# Patient Record
Sex: Female | Born: 1935 | Race: White | Hispanic: No | Smoking: Never smoker
Health system: Southern US, Community
[De-identification: ages and names within clinical notes are randomized; demographics above are authoritative.]

## PROBLEM LIST (undated history)

## (undated) DIAGNOSIS — I10 Essential (primary) hypertension: Secondary | ICD-10-CM

## (undated) DIAGNOSIS — M25569 Pain in unspecified knee: Secondary | ICD-10-CM

## (undated) DIAGNOSIS — M8580 Other specified disorders of bone density and structure, unspecified site: Secondary | ICD-10-CM

## (undated) DIAGNOSIS — J189 Pneumonia, unspecified organism: Secondary | ICD-10-CM

## (undated) DIAGNOSIS — W19XXXA Unspecified fall, initial encounter: Secondary | ICD-10-CM

## (undated) DIAGNOSIS — T148XXA Other injury of unspecified body region, initial encounter: Secondary | ICD-10-CM

## (undated) DIAGNOSIS — I82409 Acute embolism and thrombosis of unspecified deep veins of unspecified lower extremity: Secondary | ICD-10-CM

## (undated) DIAGNOSIS — I509 Heart failure, unspecified: Secondary | ICD-10-CM

## (undated) DIAGNOSIS — D532 Scorbutic anemia: Secondary | ICD-10-CM

## (undated) DIAGNOSIS — S82899B Other fracture of unspecified lower leg, initial encounter for open fracture type I or II: Secondary | ICD-10-CM

## (undated) DIAGNOSIS — R55 Syncope and collapse: Secondary | ICD-10-CM

## (undated) DIAGNOSIS — R200 Anesthesia of skin: Secondary | ICD-10-CM

## (undated) DIAGNOSIS — R21 Rash and other nonspecific skin eruption: Secondary | ICD-10-CM

## (undated) DIAGNOSIS — C801 Malignant (primary) neoplasm, unspecified: Secondary | ICD-10-CM

## (undated) DIAGNOSIS — K219 Gastro-esophageal reflux disease without esophagitis: Secondary | ICD-10-CM

## (undated) DIAGNOSIS — I4891 Unspecified atrial fibrillation: Secondary | ICD-10-CM

## (undated) HISTORY — PX: APPENDECTOMY: SHX54

## (undated) HISTORY — PX: ELBOW SURGERY: SHX618

## (undated) HISTORY — PX: CATARACT EXTRACTION: SUR2

---

## 2000-03-10 ENCOUNTER — Encounter: Payer: Self-pay | Admitting: Endocrinology

## 2000-03-10 ENCOUNTER — Encounter: Admission: RE | Admit: 2000-03-10 | Discharge: 2000-03-10 | Payer: Self-pay | Admitting: Endocrinology

## 2002-09-05 ENCOUNTER — Encounter: Admission: RE | Admit: 2002-09-05 | Discharge: 2002-09-05 | Payer: Self-pay | Admitting: Endocrinology

## 2002-09-05 ENCOUNTER — Encounter: Payer: Self-pay | Admitting: Endocrinology

## 2003-10-05 ENCOUNTER — Encounter: Admission: RE | Admit: 2003-10-05 | Discharge: 2003-10-05 | Payer: Self-pay | Admitting: Endocrinology

## 2004-11-01 ENCOUNTER — Encounter: Admission: RE | Admit: 2004-11-01 | Discharge: 2004-11-01 | Payer: Self-pay | Admitting: Endocrinology

## 2007-02-03 ENCOUNTER — Encounter: Admission: RE | Admit: 2007-02-03 | Discharge: 2007-04-01 | Payer: Self-pay | Admitting: Neurology

## 2009-02-19 ENCOUNTER — Encounter: Admission: RE | Admit: 2009-02-19 | Discharge: 2009-04-24 | Payer: Self-pay | Admitting: Family Medicine

## 2009-04-24 ENCOUNTER — Encounter: Admission: RE | Admit: 2009-04-24 | Discharge: 2009-04-24 | Payer: Self-pay | Admitting: Orthopedic Surgery

## 2011-07-04 ENCOUNTER — Inpatient Hospital Stay (HOSPITAL_COMMUNITY): Payer: Medicare Other

## 2011-07-04 ENCOUNTER — Other Ambulatory Visit: Payer: Self-pay

## 2011-07-04 ENCOUNTER — Inpatient Hospital Stay (HOSPITAL_COMMUNITY)
Admission: EM | Admit: 2011-07-04 | Discharge: 2011-07-10 | DRG: 690 | Disposition: A | Discharge status: Skilled Nursing Facility | Payer: Medicare Other | Attending: Internal Medicine | Admitting: Internal Medicine

## 2011-07-04 ENCOUNTER — Emergency Department (HOSPITAL_COMMUNITY): Payer: Medicare Other

## 2011-07-04 DIAGNOSIS — S322XXA Fracture of coccyx, initial encounter for closed fracture: Secondary | ICD-10-CM | POA: Diagnosis present

## 2011-07-04 DIAGNOSIS — I4891 Unspecified atrial fibrillation: Secondary | ICD-10-CM | POA: Diagnosis present

## 2011-07-04 DIAGNOSIS — R5381 Other malaise: Secondary | ICD-10-CM | POA: Diagnosis present

## 2011-07-04 DIAGNOSIS — M25569 Pain in unspecified knee: Secondary | ICD-10-CM

## 2011-07-04 DIAGNOSIS — W010XXA Fall on same level from slipping, tripping and stumbling without subsequent striking against object, initial encounter: Secondary | ICD-10-CM | POA: Diagnosis present

## 2011-07-04 DIAGNOSIS — Y92009 Unspecified place in unspecified non-institutional (private) residence as the place of occurrence of the external cause: Secondary | ICD-10-CM

## 2011-07-04 DIAGNOSIS — K5900 Constipation, unspecified: Secondary | ICD-10-CM | POA: Diagnosis present

## 2011-07-04 DIAGNOSIS — E871 Hypo-osmolality and hyponatremia: Secondary | ICD-10-CM | POA: Diagnosis present

## 2011-07-04 DIAGNOSIS — M8448XA Pathological fracture, other site, initial encounter for fracture: Secondary | ICD-10-CM

## 2011-07-04 DIAGNOSIS — I10 Essential (primary) hypertension: Secondary | ICD-10-CM | POA: Diagnosis present

## 2011-07-04 DIAGNOSIS — G5790 Unspecified mononeuropathy of unspecified lower limb: Secondary | ICD-10-CM | POA: Diagnosis present

## 2011-07-04 DIAGNOSIS — W19XXXA Unspecified fall, initial encounter: Secondary | ICD-10-CM | POA: Diagnosis present

## 2011-07-04 DIAGNOSIS — N390 Urinary tract infection, site not specified: Principal | ICD-10-CM | POA: Diagnosis present

## 2011-07-04 DIAGNOSIS — M810 Age-related osteoporosis without current pathological fracture: Secondary | ICD-10-CM | POA: Diagnosis present

## 2011-07-04 DIAGNOSIS — D62 Acute posthemorrhagic anemia: Secondary | ICD-10-CM | POA: Diagnosis present

## 2011-07-04 DIAGNOSIS — S3210XA Unspecified fracture of sacrum, initial encounter for closed fracture: Secondary | ICD-10-CM | POA: Diagnosis present

## 2011-07-04 DIAGNOSIS — R21 Rash and other nonspecific skin eruption: Secondary | ICD-10-CM

## 2011-07-04 DIAGNOSIS — R350 Frequency of micturition: Secondary | ICD-10-CM | POA: Clinically undetermined

## 2011-07-04 DIAGNOSIS — D638 Anemia in other chronic diseases classified elsewhere: Secondary | ICD-10-CM | POA: Diagnosis present

## 2011-07-04 DIAGNOSIS — R55 Syncope and collapse: Secondary | ICD-10-CM | POA: Diagnosis present

## 2011-07-04 HISTORY — DX: Essential (primary) hypertension: I10

## 2011-07-04 HISTORY — DX: Anesthesia of skin: R20.0

## 2011-07-04 HISTORY — DX: Unspecified fall, initial encounter: W19.XXXA

## 2011-07-04 HISTORY — DX: Pain in unspecified knee: M25.569

## 2011-07-04 HISTORY — DX: Scorbutic anemia: D53.2

## 2011-07-04 HISTORY — DX: Unspecified atrial fibrillation: I48.91

## 2011-07-04 HISTORY — PX: EXPLORATORY LAPAROTOMY: SUR591

## 2011-07-04 HISTORY — DX: Other injury of unspecified body region, initial encounter: T14.8XXA

## 2011-07-04 HISTORY — DX: Syncope and collapse: R55

## 2011-07-04 HISTORY — DX: Rash and other nonspecific skin eruption: R21

## 2011-07-04 HISTORY — DX: Malignant (primary) neoplasm, unspecified: C80.1

## 2011-07-04 LAB — CBC
HCT: 29.4 % — ABNORMAL LOW (ref 36.0–46.0)
MCH: 28.5 pg (ref 26.0–34.0)
MCH: 28.7 pg (ref 26.0–34.0)
MCHC: 33.7 g/dL (ref 30.0–36.0)
MCV: 84.5 fL (ref 78.0–100.0)
MCV: 85.2 fL (ref 78.0–100.0)
Platelets: 229 10*3/uL (ref 150–400)
Platelets: 200 10*3/uL (ref 150–400)
RBC: 4.07 MIL/uL (ref 3.87–5.11)
RDW: 12.9 % (ref 11.5–15.5)
RDW: 12.9 % (ref 11.5–15.5)

## 2011-07-04 LAB — URINALYSIS, ROUTINE W REFLEX MICROSCOPIC
Bilirubin Urine: NEGATIVE
Bilirubin Urine: NEGATIVE
Glucose, UA: NEGATIVE mg/dL
Ketones, ur: NEGATIVE mg/dL
Ketones, ur: NEGATIVE mg/dL
Nitrite: POSITIVE — AB
Nitrite: POSITIVE — AB
Specific Gravity, Urine: 1.013 (ref 1.005–1.030)
Specific Gravity, Urine: 1.013 (ref 1.005–1.030)
Urobilinogen, UA: 0.2 mg/dL (ref 0.0–1.0)
pH: 7 (ref 5.0–8.0)
pH: 5.5 (ref 5.0–8.0)

## 2011-07-04 LAB — URINE MICROSCOPIC-ADD ON

## 2011-07-04 LAB — COMPREHENSIVE METABOLIC PANEL
ALT: 14 U/L (ref 0–35)
AST: 20 U/L (ref 0–37)
Albumin: 3.8 g/dL (ref 3.5–5.2)
Alkaline Phosphatase: 76 U/L (ref 39–117)
Calcium: 10.7 mg/dL — ABNORMAL HIGH (ref 8.4–10.5)
GFR calc Af Amer: 70 mL/min — ABNORMAL LOW (ref >90)
Potassium: 4.1 mEq/L (ref 3.5–5.1)
Sodium: 128 mEq/L — ABNORMAL LOW (ref 135–145)
Total Protein: 7 g/dL (ref 6.0–8.3)

## 2011-07-04 LAB — CARDIAC PANEL(CRET KIN+CKTOT+MB+TROPI)
CK, MB: 4.4 ng/mL — ABNORMAL HIGH (ref 0.3–4.0)
Relative Index: 2.9 — ABNORMAL HIGH (ref 0.0–2.5)
Troponin I: <0.3 ng/mL (ref <0.30)

## 2011-07-04 LAB — DIFFERENTIAL
Basophils Absolute: 0 10*3/uL (ref 0.0–0.1)
Basophils Relative: 0 % (ref 0–1)
Eosinophils Absolute: 0 10*3/uL (ref 0.0–0.7)
Eosinophils Relative: 0 % (ref 0–5)
Lymphs Abs: 1 10*3/uL (ref 0.7–4.0)
Neutrophils Relative %: 81 % — ABNORMAL HIGH (ref 43–77)

## 2011-07-04 MED ORDER — SODIUM CHLORIDE 0.9 % IV SOLN
INTRAVENOUS | Status: DC
  Administered 2011-07-04: 18:00:00 via INTRAVENOUS

## 2011-07-04 MED ORDER — SODIUM CHLORIDE 0.9 % IV SOLN
Freq: Once | INTRAVENOUS | Status: AC
  Administered 2011-07-04: 10:00:00 via INTRAVENOUS
  Administered 2011-07-04: 1000 mL via INTRAVENOUS

## 2011-07-04 MED ORDER — HEPARIN SODIUM (PORCINE) 5000 UNIT/ML IJ SOLN
5000.0000 [IU] | Freq: Three times a day (TID) | INTRAMUSCULAR | Status: DC
  Administered 2011-07-04 – 2011-07-06 (×6): 5000 [IU] via SUBCUTANEOUS
  Filled 2011-07-04 (×8): qty 1

## 2011-07-04 MED ORDER — OLMESARTAN MEDOXOMIL 40 MG PO TABS
40.0000 mg | ORAL_TABLET | Freq: Every day | ORAL | Status: DC
  Administered 2011-07-05 – 2011-07-10 (×6): 40 mg via ORAL
  Filled 2011-07-04 (×8): qty 1

## 2011-07-04 MED ORDER — SODIUM CHLORIDE 0.9 % IV SOLN: INTRAVENOUS | Status: DC

## 2011-07-04 MED ORDER — SODIUM CHLORIDE 0.9 % IV SOLN: Freq: Once | INTRAVENOUS | Status: DC

## 2011-07-04 MED ORDER — CIPROFLOXACIN IN D5W 400 MG/200ML IV SOLN
400.0000 mg | Freq: Two times a day (BID) | INTRAVENOUS | Status: DC
  Filled 2011-07-04 (×5): qty 200

## 2011-07-04 NOTE — ED Notes (Signed)
Pt was being assisted to stand by niece, and as she stood she became unresponsive..was gently lowered to the floor...the patient also fell yesterday and was on the floor for 1 hour before being found.Shirley KitchenMarland Strickland

## 2011-07-04 NOTE — ED Provider Notes (Signed)
History     CSN: [id number] Arrival date & time: 07/04/2011  9:02 AM   First MD Initiated Contact with Patient 07/04/11 0920      Chief Complaint  Patient presents with  . Loss of Consciousness    (Consider location/radiation/quality/duration/timing/severity/associated sxs/prior treatment) Patient is a 77 y.o. female presenting with syncope. The history is provided by the patient and a relative.  Loss of Consciousness   patient here with syncopal event witnessed by relatives today. History of falls in the past did use a walker normally. Had a fall yesterday where she was helped to the ground by her relative. Patient today was walking became diaphoretic and then had a brief syncopal event. No seizure activity noted, no postictal period. Patient denies chest pain shortness of breath abdominal pain or severe headache prior to the today's event. No prior history of syncope in the past. Patient denies any new injuries now. No recent history of fever or bloody stools. Nothing makes her symptoms better or worse  Past Medical History  Diagnosis Date  . Cancer   . 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 No pertinent past surgical history.  History reviewed. No pertinent family history.  History  Substance Use Topics  . Smoking status: Never Smoker   . Smokeless tobacco: Not on file  . Alcohol Use: 1.2 oz/week    2 Cans of beer per week    OB History    Grav Para Term Preterm Abortions TAB SAB Ect Mult Living                  Review of Systems  Cardiovascular: Positive for syncope.  All other systems reviewed and are negative.    Allergies  Biaxin; Cod; Codeine; Darvocet; Demadex; Diovan hct; Ex-lax; Ibuprofen; Indanediones; Indapamide; Morphine and related; Penicillins; Septra; and Tessalon  Home Medications  No current outpatient prescriptions on file.  BP 182/69  Pulse 91  Temp(Src) 97.9 F (36.6 C) (Oral)  Resp 20  SpO2 100%  Physical Exam    Nursing note and vitals reviewed. Constitutional: She is oriented to person, place, and time. She appears well-developed and well-nourished.  Non-toxic appearance. No distress.  HENT:  Head: Normocephalic and atraumatic.  Eyes: Conjunctivae and EOM are normal. Pupils are equal, round, and reactive to light.  Neck: Normal range of motion. Neck supple. No tracheal deviation present.  Cardiovascular: Normal rate, regular rhythm and normal heart sounds.  Exam reveals no gallop.   No murmur heard. Pulmonary/Chest: Effort normal and breath sounds normal. No stridor. No respiratory distress. She has no wheezes.  Abdominal: Soft. Normal appearance and bowel sounds are normal. She exhibits no distension. There is no tenderness. There is no rebound.  Musculoskeletal: Normal range of motion. She exhibits no edema and no tenderness.       Legs: Neurological: She is alert and oriented to person, place, and time. No cranial nerve deficit or sensory deficit. She exhibits abnormal muscle tone. GCS eye subscore is 4. GCS verbal subscore is 5. GCS motor subscore is 6.       Patient's strength is 2/5 bilateral lower  Skin: Skin is warm and dry.  Psychiatric: She has a normal mood and affect. Her speech is normal and behavior is normal.    ED Course  Procedures (including critical care time)   Labs Reviewed  CBC  DIFFERENTIAL  COMPREHENSIVE METABOLIC PANEL  URINALYSIS, ROUTINE W REFLEX MICROSCOPIC  CARDIAC PANEL(CRET KIN+CKTOT+MB+TROPI)   No  results found.   No diagnosis found.    MDM   Date: 07/04/2011  Rate: 96  Rhythm: normal sinus rhythm  QRS Axis: normal  Intervals: normal  ST/T Wave abnormalities: normal  Conduction Disutrbances:none  Narrative Interpretation:   Old EKG Reviewed: none available   Patient's labs and x-rays noted, patient to be admitted for evaluation of syncope       Toy Baker, MD 07/04/11 1232

## 2011-07-04 NOTE — ED Notes (Signed)
Family at bedside. 

## 2011-07-04 NOTE — H&P (Signed)
Shirley Strickland is an 77 y.o. female.   Chief Complaint: syncope one day HPI:  77 year old female, with multiple allergies to medications, came from home ,she used her walker to walk and she managed her self well without many falls. Presented this time after she experienced a fall yesterday ,at that time she denies any syncope ,as per patient, she tripped and fall, and at  time denies any chest pain or shortness of breath , no seizure ,didnot hit her head , she managed to go to her bed and call for help, today her niece came to see her and as she tried to walk her to the bath room patient was unable to walk and then she felt dizzy and clammy and niece noticed episode of sweating and patient went gradually into the floor , she lose her consciousness for almost 2 minutes and when ambulance arrived patient was completely awake , denies any chest pain but she felt her legs are painful and stiff , denies any numbness on both legs, she has an EKG  Admission which suggest afib but there is obvious P WAVE Past Medical History  Diagnosis Date  . 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 hyponatremia       . Numbness of legs and very sensitive to pain       . Bruise     bruise on left foot from fall 07/03/11  . hypertension       . Rash and nonspecific skin eruption     rash on bilateral upper legs  . osteoporosis   . Gait disorder   .          Past Surgical History  Procedure Date  . Elbow surgery     right  . Appendectomy   . Exploratory laparotomy 07/04/11    years ago to find cause of bleeding    Family history: non contributive. Social History:  reports that she has never smoked. She has never used smokeless tobacco. She reports that she drinks about 1.2 ounces of alcohol per week.she is a widow,she use a walker. She lives alone.  Allergies:  Allergies  Allergen Reactions  . Biaxin Nausea Only  . Cod (Fish Allergy)   . Codeine Nausea And Vomiting  . Darvocet (Propoxyphene  N-Acetaminophen)   . Demadex (Torsemide) Other (See Comments)    UNKNOWN  . Diovan Hct (Valsartan-Hydrochlorothiazide) Other (See Comments)    unknown  . Ex-Lax (Senna) Other (See Comments)    unknown  . Ibuprofen Nausea Only  . Indanediones Other (See Comments)    unknown  . Indapamide Other (See Comments)    unknown  . Micardis (Telmisartan)     Can't take generic - makes pt nauseated  . Morphine And Related Nausea And Vomiting  . Tessalon   . Penicillins Rash  . Septra (Bactrim) Rash    Medications Prior to Admission  Medication Dose Route Frequency Provider Last Rate Last Dose  . 0.9 %  sodium chloride infusion   Intravenous Once Toy Baker, MD 125 mL/hr at 07/04/11 1135 1,000 mL at 07/04/11 1135  . 0.9 %  sodium chloride infusion   Intravenous Continuous Kamaree Berkel I. Shantee Hayne      . DISCONTD: 0.9 %  sodium chloride infusion   Intravenous Once Toy Baker, MD      . DISCONTD: 0.9 %  sodium chloride infusion   Intravenous Once Toy Baker, MD       No  current outpatient prescriptions on file as of 07/04/2011.    Results for orders placed during the hospital encounter of 07/04/11 (from the past 48 hour(s))  CBC     Status: Abnormal   Collection Time   07/04/11  9:50 AM      Component Value Range Comment   WBC 12.7 (*) 4.0 - 10.5 (K/uL)    RBC 4.07  3.87 - 5.11 (MIL/uL)    Hemoglobin 11.6 (*) 12.0 - 15.0 (g/dL)    HCT 86.5 (*) 78.4 - 46.0 (%)    MCV 84.5  78.0 - 100.0 (fL)    MCH 28.5  26.0 - 34.0 (pg)    MCHC 33.7  30.0 - 36.0 (g/dL)    RDW 69.6  29.5 - 28.4 (%)    Platelets 229  150 - 400 (K/uL)   DIFFERENTIAL     Status: Abnormal   Collection Time   07/04/11  9:50 AM      Component Value Range Comment   Neutrophils Relative 81 (*) 43 - 77 (%)    Neutro Abs 10.3 (*) 1.7 - 7.7 (K/uL)    Lymphocytes Relative 8 (*) 12 - 46 (%)    Lymphs Abs 1.0  0.7 - 4.0 (K/uL)    Monocytes Relative 11  3 - 12 (%)    Monocytes Absolute 1.4 (*) 0.1 - 1.0 (K/uL)    Eosinophils  Relative 0  0 - 5 (%)    Eosinophils Absolute 0.0  0.0 - 0.7 (K/uL)    Basophils Relative 0  0 - 1 (%)    Basophils Absolute 0.0  0.0 - 0.1 (K/uL)   COMPREHENSIVE METABOLIC PANEL     Status: Abnormal   Collection Time   07/04/11  9:50 AM      Component Value Range Comment   Sodium 128 (*) 135 - 145 (mEq/L)    Potassium 4.1  3.5 - 5.1 (mEq/L)    Chloride 93 (*) 96 - 112 (mEq/L)    CO2 25  19 - 32 (mEq/L)    Glucose, Bld 141 (*) 70 - 99 (mg/dL)    BUN 15  6 - 23 (mg/dL)    Creatinine, Ser 1.32  0.50 - 1.10 (mg/dL)    Calcium 44.0 (*) 8.4 - 10.5 (mg/dL)    Total Protein 7.0  6.0 - 8.3 (g/dL)    Albumin 3.8  3.5 - 5.2 (g/dL)    AST 20  0 - 37 (U/L)    ALT 14  0 - 35 (U/L)    Alkaline Phosphatase 76  39 - 117 (U/L)    Total Bilirubin 0.8  0.3 - 1.2 (mg/dL)    GFR calc non Af Amer 61 (*) >90 (mL/min)    GFR calc Af Amer 70 (*) >90 (mL/min)   CARDIAC PANEL(CRET KIN+CKTOT+MB+TROPI)     Status: Abnormal   Collection Time   07/04/11  9:50 AM      Component Value Range Comment   Total CK 150  7 - 177 (U/L)    CK, MB 4.4 (*) 0.3 - 4.0 (ng/mL)    Troponin I <0.30  <0.30 (ng/mL)    Relative Index 2.9 (*) 0.0 - 2.5    URINALYSIS, ROUTINE W REFLEX MICROSCOPIC     Status: Abnormal   Collection Time   07/04/11 10:02 AM      Component Value Range Comment   Color, Urine YELLOW  YELLOW     Appearance CLOUDY (*) CLEAR     Specific Gravity,  Urine 1.013  1.005 - 1.030     pH 7.0  5.0 - 8.0     Glucose, UA NEGATIVE  NEGATIVE (mg/dL)    Hgb urine dipstick NEGATIVE  NEGATIVE     Bilirubin Urine NEGATIVE  NEGATIVE     Ketones, ur NEGATIVE  NEGATIVE (mg/dL)    Protein, ur NEGATIVE  NEGATIVE (mg/dL)    Urobilinogen, UA 0.2  0.0 - 1.0 (mg/dL)    Nitrite POSITIVE (*) NEGATIVE     Leukocytes, UA TRACE (*) NEGATIVE    URINE MICROSCOPIC-ADD ON     Status: Abnormal   Collection Time   07/04/11 10:02 AM      Component Value Range Comment   Squamous Epithelial / LPF RARE  RARE     WBC, UA 11-20  <3  (WBC/hpf)    RBC / HPF 0-2  <3 (RBC/hpf)    Bacteria, UA MANY (*) RARE     Urine-Other MUCOUS PRESENT      Dg Chest 2 View  07/04/2011  *RADIOLOGY REPORT*  Clinical Data: Hypertension, fall, pain  CHEST - 2 VIEW  Comparison: None  Findings: Upper normal heart size. Elongation aorta. Slight pulmonary vascular congestion. Bronchitic changes without infiltrate or effusion. No pneumothorax. Bones diffusely demineralized.  IMPRESSION: Bronchitic changes.  Original Report Authenticated By: Lollie Marrow, M.D.   Dg Lumbar Spine Complete  07/04/2011  *RADIOLOGY REPORT*  Clinical Data: Low back and bilateral leg pain, no known injury  LUMBAR SPINE - COMPLETE 4+ VIEW  Comparison: None  Findings: Osseous demineralization. Five non-rib bearing lumbar vertebrae. Levoconvex lumbar scoliosis apex L3. Multilevel disc space narrowing and endplate spur formation. Facet degenerative changes mid to lower lumbar spine. Mild superior end plate height loss at L4 and questionably L3. These are age indeterminate. No additional fracture, subluxation or bone destruction. No spondylolysis. Scattered atherosclerotic calcifications aorta.  IMPRESSION: Osseous demineralization. Multilevel degenerative disc and facet disease changes of the lumbar spine with associated levoconvex scoliosis. Age indeterminate superior endplate compression deformity of L4, questionably L3.  Per CMS PQRS reporting requirements (PQRS Measure 24): Given the patient's age of greater than 50 and the fracture site (hip, distal radius, or spine), the patient should be tested for osteoporosis using DXA, and the appropriate treatment considered based on the DXA results.  Original Report Authenticated By: Lollie Marrow, M.D.    Review of Systems  Constitutional: Positive for malaise/fatigue and diaphoresis. Negative for fever, chills and weight loss.  HENT: Negative for hearing loss, ear pain, nosebleeds, congestion, sore throat, neck pain, tinnitus and ear  discharge.   Eyes: Negative for blurred vision, double vision, photophobia, pain, discharge and redness.  Respiratory: Negative for cough, hemoptysis, shortness of breath, wheezing and stridor.   Cardiovascular: Positive for claudication. Negative for chest pain, palpitations, orthopnea, leg swelling and PND.  Gastrointestinal: Negative.   Genitourinary: Negative.   Musculoskeletal: Positive for myalgias, joint pain and falls. Negative for back pain.  Skin: Positive for rash. Negative for itching.  Neurological: Positive for dizziness, tingling, tremors and weakness. Negative for sensory change, speech change, focal weakness, seizures, loss of consciousness and headaches.  Endo/Heme/Allergies: Bruises/bleeds easily.    Blood pressure 170/60, pulse 90, temperature 97.9 F (36.6 C), temperature source Oral, resp. rate 20, SpO2 99.00%. Physical Exam  Constitutional: She is oriented to person, place, and time. She appears well-developed and well-nourished. No distress.  HENT:  Head: Normocephalic.  Right Ear: External ear normal.  Left Ear: External ear normal.  Eyes: Pupils  are equal, round, and reactive to light.  Neck: No JVD present. No tracheal deviation present. No thyromegaly present.  Cardiovascular: Normal heart sounds and intact distal pulses.  Exam reveals no gallop.   No murmur heard. Respiratory: No stridor. No respiratory distress. She has no wheezes. She has no rales. She exhibits no tenderness.  GI: Soft. Bowel sounds are normal.  Musculoskeletal: She exhibits tenderness.       HAS  Very sensitive legs to touch but she has good peripheral pulse, on the left foot there is obvious ecchymosis and deformity of the left toe and very tender to palpation, some ecchymosis on the dorsal surface of her foot. No hip tenderness, no pattella pain and no swelling  Lymphadenopathy:    She has no cervical adenopathy.  Neurological: She is alert and oriented to person, place, and time. No  cranial nerve deficit.  Skin: Skin is warm. Rash noted. She is not diaphoretic. No pallor.     Assessment/Plan 1-this is a 77 year old female, lives at home alones, she used a walker, presented with a fall yesterday and today she had a syncopal episode,y.  1- syncopal episodes patient denies any chest pain or shortness of breath,  syncope likely from pain from her lower extremity pain but I cannot exclude any arrhythmia especially there is an EKG  just AFIB. Patient would be admitted to telemetry, will cycle cardiac enzymes, would proceed with 2-D echo. I would repeat this EKG as in the current EKG there is obvious P wave, questioning  IF this is true A. fib. Would also check patient   2-Status post fall: will the involve physical therapy, I would proceed with the x-ray of her left ankle, foot and bilateral hip and pelvis to exclude any fractures especially with some ecchymosis of the dorsal of her left  foot . will proceed with arterial Dopplers to exclude any clotting on her lower extremity.  3-Hyponatremia patient's sodium is 128  continue normal saline, patient has a history of chronic hyponatremia  4-Urinary tract infection we'll start the patient on Rocephin IV pending sensitivity and culture  5-leukocytosis likely secondary to a fall and a urine tract infection we will continue monitoring DVT and GI prophylaxis dictation no metatarsal pain  Paulina Muchmore I. 07/04/2011, 3:52 PM

## 2011-07-04 NOTE — ED Notes (Signed)
Patient is resting comfortably. 

## 2011-07-05 DIAGNOSIS — M79609 Pain in unspecified limb: Secondary | ICD-10-CM

## 2011-07-05 DIAGNOSIS — I059 Rheumatic mitral valve disease, unspecified: Secondary | ICD-10-CM

## 2011-07-05 LAB — CARDIAC PANEL(CRET KIN+CKTOT+MB+TROPI)
CK, MB: 4 ng/mL (ref 0.3–4.0)
Relative Index: 2.2 (ref 0.0–2.5)
Relative Index: 3.3 — ABNORMAL HIGH (ref 0.0–2.5)
Total CK: 171 U/L (ref 7–177)
Total CK: 123 U/L (ref 7–177)
Troponin I: <0.3 ng/mL (ref <0.30)
Troponin I: <0.3 ng/mL (ref <0.30)

## 2011-07-05 LAB — COMPREHENSIVE METABOLIC PANEL
ALT: 11 U/L (ref 0–35)
CO2: 23 mEq/L (ref 19–32)
Calcium: 9 mg/dL (ref 8.4–10.5)
Creatinine, Ser: 0.98 mg/dL (ref 0.50–1.10)
GFR calc Af Amer: 56 mL/min — ABNORMAL LOW (ref >90)
GFR calc non Af Amer: 48 mL/min — ABNORMAL LOW (ref >90)
Glucose, Bld: 118 mg/dL — ABNORMAL HIGH (ref 70–99)
Sodium: 131 mEq/L — ABNORMAL LOW (ref 135–145)
Total Protein: 5.1 g/dL — ABNORMAL LOW (ref 6.0–8.3)

## 2011-07-05 LAB — PHOSPHORUS: Phosphorus: 2 mg/dL — ABNORMAL LOW (ref 2.3–4.6)

## 2011-07-05 MED ORDER — SPIRITUS FRUMENTI
1.0000 | Freq: Every day | ORAL | Status: DC | PRN
  Administered 2011-07-05 – 2011-07-07 (×3): 1 via ORAL
  Filled 2011-07-05 (×5): qty 1

## 2011-07-05 MED ORDER — SPIRITUS FRUMENTI: 1.0000 | Freq: Every day | ORAL | Status: DC | PRN

## 2011-07-05 MED ORDER — CIPROFLOXACIN IN D5W 400 MG/200ML IV SOLN
400.0000 mg | Freq: Two times a day (BID) | INTRAVENOUS | Status: DC
  Administered 2011-07-05 – 2011-07-07 (×3): 400 mg via INTRAVENOUS
  Filled 2011-07-05 (×6): qty 200

## 2011-07-05 MED ORDER — FAMOTIDINE 20 MG PO TABS
20.0000 mg | ORAL_TABLET | Freq: Every day | ORAL | Status: DC
  Administered 2011-07-05 – 2011-07-10 (×6): 20 mg via ORAL
  Filled 2011-07-05 (×7): qty 1

## 2011-07-05 NOTE — Progress Notes (Signed)
*  PRELIMINARY RESULTS*   Bilateral lower extremity venous dopplers completed.  No obvious evidence of deep vein thrombosis noted.  Shirley Strickland, Shirley Strickland 07/05/2011, 10:30 AM

## 2011-07-05 NOTE — Progress Notes (Signed)
Physical Therapy Evaluation Patient Details Name: Shirley Strickland MRN: [id number] DOB: [birth year] Today's Date: 07/05/2011 Time: 8295-6213   Eval II Problem List:  Patient Active Problem List  Diagnoses  . Syncope  . Fall  . HTN (hypertension)  . A-fib    Past Medical History:  Past Medical History  Diagnosis Date  . 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 Cancer     pt states only skin cancer  . Numbness of legs     started after fall yesterday 07/03/11  . Bruise     bruise on left foot from fall 07/03/11  . Knee pain, acute 07/04/11    Pt states left knee twisted under her in fall 07/03/11 and now painful  . Rash and nonspecific skin eruption 07/04/11    rash on bilateral upper legs  . Syncope   . Fall   . HTN (hypertension)   . A-fib    Past Surgical History:  Past Surgical History  Procedure Date  . Elbow surgery     right  . Appendectomy   . Exploratory laparotomy 07/04/11    years ago to find cause of bleeding    PT Assessment/Plan/Recommendation PT Assessment Clinical Impression Statement: Pt presents with diagnosis of syncope, fall. Pt has also experienced the following complications: severe pain in LEs - left > right. Pt will benefit from skilled PT services in the acute care setting to improve activity tolerance, gait, and general strength in preperation for DC. PT Recommendation/Assessment: Patient will need skilled PT in the acute care venue PT Problem List: Decreased strength;Decreased range of motion;Decreased activity tolerance;Decreased mobility;Decreased knowledge of use of DME;Pain Barriers to Discharge: Decreased caregiver support PT Therapy Diagnosis : Difficulty walking;Generalized weakness;Acute pain;Abnormality of gait PT Plan PT Frequency: Min 3X/week PT Treatment/Interventions: DME instruction;Gait training;Functional mobility training;Therapeutic exercise;Patient/family education PT Recommendation Recommendations for Other Services: OT  consult Follow Up Recommendations: Skilled nursing facility Equipment Recommended: Defer to next venue PT Goals  Acute Rehab PT Goals PT Goal Formulation: With patient Time For Goal Achievement: 2 weeks Pt will go Supine/Side to Sit: with supervision Pt will go Sit to Supine/Side: with supervision Pt will Transfer Sit to Stand/Stand to Sit: with min assist Pt will Transfer Bed to Chair/Chair to Bed: with min assist Pt will Ambulate: 16 - 50 feet;with min assist;with least restrictive assistive device  PT Evaluation Precautions/Restrictions  Precautions Precautions: Fall Precaution Comments: Bilateral lower legs extremely tender to touch Prior Functioning  Home Living Lives With: Alone Type of Home: House Home Layout: One level Home Access: Stairs to enter Entrance Stairs-Rails: Doctor, general practice of Steps: 3 Home Adaptive Equipment: Walker - rolling Prior Function Level of Independence: Requires assistive device for independence Cognition Cognition Arousal/Alertness: Awake/alert Overall Cognitive Status: Appears within functional limits for tasks assessed Sensation/Coordination Sensation Additional Comments: Bilateral LEs abnormally sensitive to touch Extremity Assessment RLE Assessment RLE Assessment: Exceptions to [facility] RLE Strength RLE Overall Strength Comments: Unable to apply resistance due to painful lower legs. R LE strength at least 3/5. LLE Assessment LLE Assessment: Exceptions to WFL LLE AROM (degrees) LLE Overall AROM Comments: L ankle ROM limited due to pain. Ankle TTP on medial aspect as well. LLE Strength LLE Overall Strength Comments: Unable to apply resistance due to painful lower legs. Strength, except ankle, at least 3-/5. Mobility (including Balance) Bed Mobility Bed Mobility: Yes Supine to Sit: HOB elevated (Comment degrees);With rails;3: Mod assist (HOB 45 degrees) Supine to Sit Details (indicate cue type and  reason): Increased time.  Assist for R LE off EOB and trunk to upright Sitting - Scoot to Edge of Bed: 3: Mod assist Sitting - Scoot to Edge of Bed Details (indicate cue type and reason): Increased time. Poor weight-shifting. Utilized bed pad for scooting/positioning. Transfers Transfers: Yes Sit to Stand: 1: +2 Total assist;From elevated surface;From bed ((Pt=40%)) Sit to Stand Details (indicate cue type and reason): VCs safety, technique.  Assist to rise, stabilize. Difficulty weight-bearing on bilateral LEs - left worse than right. Pt able to stand only 10 seconds. Stand to Sit: 1: +2 Total assist ((Pt=40%)) Stand to Sit Details: Assist to control descent. Stand Pivot Transfers: 1: +2 Total assist Stand Pivot Transfer Details (indicate cue type and reason): (Pt=40%) Therapist and tech (1 on each side). Pivot to right side. Pt able to take small steps. Very effortful and painful for pt. Ambulation/Gait Ambulation/Gait: No (Pt unable.)    Exercise    End of Session PT - End of Session Equipment Utilized During Treatment: Gait belt Activity Tolerance: Patient limited by pain Patient left: in chair;with call bell in reach;with family/visitor present Nurse Communication: Mobility status for transfers General Behavior During Session: [facility] for tasks performed Cognition: [facility] for tasks performed  [name] Alert [facility] 07/05/2011, 3:35 PM

## 2011-07-05 NOTE — Progress Notes (Signed)
Shirley Strickland is a 77 y.o. female patient.  SUBJECTIVE I reviewed chart, saw and examined patient at bedside. Shirley Strickland was admitted after a syncopal event at home. Investigations so far show old left toe fractures and UTI. Patient mentions history of multiple drug allergies and per nursing reports is reluctant to take new medications. She lives with a niece at home. Family is at bedside and is very supportive. They would like to take her home if she can walk.   1. Syncope   2. Fall   3. HTN (hypertension)   4. A-fib     Past Medical History  Diagnosis Date  . 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 Cancer     pt states only skin cancer  . Numbness of legs     started after fall yesterday 07/03/11  . Bruise     bruise on left foot from fall 07/03/11  . Knee pain, acute 07/04/11    Pt states left knee twisted under her in fall 07/03/11 and now painful  . Rash and nonspecific skin eruption 07/04/11    rash on bilateral upper legs  . Syncope   . Fall   . HTN (hypertension)   . A-fib    Current Facility-Administered Medications  Medication Dose Route Frequency Provider Last Rate Last Dose  . 0.9 %  sodium chloride infusion   Intravenous Continuous Hind I. Elsaid 50 mL/hr at 07/04/11 2300    . ciprofloxacin (CIPRO) IVPB 400 mg  400 mg Intravenous Q12H Hind I. Elsaid      . famotidine (PEPCID) tablet 20 mg  20 mg Oral Daily Britne Borelli      . heparin injection 5,000 Units  5,000 Units Subcutaneous Q8H Hind I. Elsaid   5,000 Units at 07/05/11 0600  . olmesartan (BENICAR) tablet 40 mg  40 mg Oral Daily Hind I. Elsaid   40 mg at 07/05/11 0930  . DISCONTD: 0.9 %  sodium chloride infusion   Intravenous Continuous Hind I. Elsaid       Allergies  Allergen Reactions  . Biaxin Nausea Only  . Cod (Fish Allergy)   . Codeine Nausea And Vomiting  . Darvocet (Propoxyphene N-Acetaminophen)   . Demadex (Torsemide) Other (See Comments)    UNKNOWN  . Diovan Hct (Valsartan-Hydrochlorothiazide)  Other (See Comments)    unknown  . Ex-Lax (Senna) Other (See Comments)    unknown  . Ibuprofen Nausea Only  . Indanediones Other (See Comments)    unknown  . Indapamide Other (See Comments)    unknown  . Micardis (Telmisartan)     Can't take generic - makes pt nauseated  . Morphine And Related Nausea And Vomiting  . Tessalon   . Penicillins Rash  . Septra (Bactrim) Rash   Active Problems:  Syncope  Fall  HTN (hypertension)  A-fib   Vital signs in last 24 hours: Temp:  [98.9 F (37.2 C)-99.9 F (37.7 C)] 99.1 F (37.3 C) (11/10 0931) Pulse Rate:  [86-102] 86  (11/10 0931) Resp:  [18-20] 18  (11/10 0931) BP: (147-176)/(54-74) 148/54 mmHg (11/10 0931) SpO2:  [90 %-99 %] 98 % (11/10 0931) FiO2 (%):  [2 %] 2 % (11/10 0931) Weight:  [61.6 kg (135 lb 12.9 oz)] 135 lb 12.9 oz (61.6 kg) (11/09 1609) Weight change:  Last BM Date: 07/03/11  Intake/Output from previous day: 11/09 0701 - 11/10 0700 In: 1250 [I.V.:1250] Out: -  Intake/Output this shift: Total I/O In: -  Out: 250 [  Urine:250]  Lab Results:  Basename 07/04/11 1727 07/04/11 0950  WBC 11.1* 12.7*  HGB 9.9* 11.6*  HCT 29.4* 34.4*  PLT 200 229   BMET  Basename 07/05/11 0030 07/04/11 1727 07/04/11 0950  NA 131* -- 128*  K 4.0 -- 4.1  CL 100 -- 93*  CO2 23 -- 25  GLUCOSE 118* -- 141*  BUN 15 -- 15  CREATININE 0.98 0.73 --  CALCIUM 9.0 -- 10.7*    Studies/Results: Dg Chest 2 View  07/04/2011  *RADIOLOGY REPORT*  Clinical Data: Hypertension, fall, pain  CHEST - 2 VIEW  Comparison: None  Findings: Upper normal heart size. Elongation aorta. Slight pulmonary vascular congestion. Bronchitic changes without infiltrate or effusion. No pneumothorax. Bones diffusely demineralized.  IMPRESSION: Bronchitic changes.  Original Report Authenticated By: Lollie Marrow, M.D.   Dg Lumbar Spine Complete  07/04/2011  *RADIOLOGY REPORT*  Clinical Data: Low back and bilateral leg pain, no known injury  LUMBAR SPINE -  COMPLETE 4+ VIEW  Comparison: None  Findings: Osseous demineralization. Five non-rib bearing lumbar vertebrae. Levoconvex lumbar scoliosis apex L3. Multilevel disc space narrowing and endplate spur formation. Facet degenerative changes mid to lower lumbar spine. Mild superior end plate height loss at L4 and questionably L3. These are age indeterminate. No additional fracture, subluxation or bone destruction. No spondylolysis. Scattered atherosclerotic calcifications aorta.  IMPRESSION: Osseous demineralization. Multilevel degenerative disc and facet disease changes of the lumbar spine with associated levoconvex scoliosis. Age indeterminate superior endplate compression deformity of L4, questionably L3.  Per CMS PQRS reporting requirements (PQRS Measure 24): Given the patient's age of greater than 50 and the fracture site (hip, distal radius, or spine), the patient should be tested for osteoporosis using DXA, and the appropriate treatment considered based on the DXA results.  Original Report Authenticated By: Lollie Marrow, M.D.   Dg Hip Bilateral Vito Berger  07/04/2011  *RADIOLOGY REPORT*  Clinical Data: Bilateral hip pain and stiffness secondary to a fall today.  BILATERAL HIP WITH PELVIS - 4+ VIEW  Comparison: None.  Findings: No fracture, dislocation, arthritis, or other significant abnormality of the hips or pelvis.  IMPRESSION: Normal exam.  Original Report Authenticated By: Gwynn Burly, M.D.   Dg Ankle 2 Views Left  07/04/2011  *RADIOLOGY REPORT*  Clinical Data: Fall, left ankle pain/bruising  LEFT ANKLE - 2 VIEW  Comparison: None.  Findings: No definite fracture is seen.  Mild irregularity of the medial malleolus.  The visualized soft tissues are grossly unremarkable.  IMPRESSION: No definite fracture is seen.  Mild irregularity of the medial malleolus.  Correlate with the site of the patient's pain.  Original Report Authenticated By: Charline Bills, M.D.   Ct Head Wo Contrast  07/04/2011   *RADIOLOGY REPORT*  Clinical Data: The patient fell.  CT HEAD WITHOUT CONTRAST  Technique:  Contiguous axial images were obtained from the base of the skull through the vertex without contrast.  Comparison: None.  Findings: There is no acute intracranial hemorrhage, infarction, or mass lesion.  Mild cerebral cortical atrophy.  Prominent venous lakes in the occipital bone, not felt to be significant.  IMPRESSION: No acute intracranial abnormality.  Mild atrophy.  Original Report Authenticated By: Gwynn Burly, M.D.   Dg Foot 2 Views Left  07/04/2011  *RADIOLOGY REPORT*  Clinical Data: Fall, left foot pain, bruising on the third through fifth toes and ankle  LEFT FOOT - 2 VIEW  Comparison: None.  Findings: No evidence of acute fracture or dislocation.  Deformity  of the third, fourth, and fifth metatarsals, suggesting prior fracture.  The visualized soft tissues are unremarkable.  IMPRESSION: No evidence of acute fracture or dislocation.  Old fractures of the left third through fifth metatarsals.  Original Report Authenticated By: Charline Bills, M.D.    Medications: I have reviewed the patient's current medications.   Physical exam GENERAL- alert, well and happy HEAD- normal atraumatic, no neck masses, normal thyroid, no jvd RESPIRATORY- appears well, vitals normal, no respiratory distress, acyanotic, normal RR, ear and throat exam is normal, neck free of mass or lymphadenopathy, chest clear, no wheezing, crepitations, rhonchi, normal symmetric air entry CVS- regular rate and rhythm, S1, S2 normal, no murmur, click, rub or gallop ABDOMEN- abdomen is soft without significant tenderness, masses, organomegaly or guarding NEURO- Grossly normal except for being hard of hearing. EXTREMITIES- extremities normal, atraumatic, no cyanosis or edema. Very sensitive to touch.  Plan  1. Syncope/Fall- likely secondary to UTI/peripheral neuropathy. Will ask physical therapy to ambulate. If can mange,  eventually home with family. Needs meds for peripheral neuropathy but refuses due to allergy concerns. Check vitamin B12/RPR.  2. UTI - on Cipro, encouraged patient to try the antibiotic and she agreed. We will watch closely for adverse reaction.  3. HTN (hypertension)- generally controlled. Continue home meds.  4. A-fib- rate controlled. 5. SCDs    Katarina Riebe 07/05/2011 12:23 PM Pager: [phone].

## 2011-07-05 NOTE — Progress Notes (Signed)
  Echocardiogram 2D Echocardiogram has been performed.  Mercy Moore 07/05/2011, 11:50 AM

## 2011-07-06 ENCOUNTER — Inpatient Hospital Stay (HOSPITAL_COMMUNITY): Payer: Medicare Other

## 2011-07-06 LAB — CBC
Hemoglobin: 8.4 g/dL — ABNORMAL LOW (ref 12.0–15.0)
MCH: 28.7 pg (ref 26.0–34.0)
Platelets: 155 10*3/uL (ref 150–400)
RBC: 2.93 MIL/uL — ABNORMAL LOW (ref 3.87–5.11)
WBC: 10.6 10*3/uL — ABNORMAL HIGH (ref 4.0–10.5)

## 2011-07-06 LAB — COMPREHENSIVE METABOLIC PANEL
ALT: 11 U/L (ref 0–35)
AST: 16 U/L (ref 0–37)
Alkaline Phosphatase: 50 U/L (ref 39–117)
CO2: 23 mEq/L (ref 19–32)
Calcium: 8.5 mg/dL (ref 8.4–10.5)
GFR calc non Af Amer: 60 mL/min — ABNORMAL LOW (ref >90)
Glucose, Bld: 133 mg/dL — ABNORMAL HIGH (ref 70–99)
Potassium: 3.7 mEq/L (ref 3.5–5.1)
Sodium: 128 mEq/L — ABNORMAL LOW (ref 135–145)
Total Protein: 5 g/dL — ABNORMAL LOW (ref 6.0–8.3)

## 2011-07-06 LAB — RPR: RPR Ser Ql: NONREACTIVE

## 2011-07-06 LAB — VITAMIN B12: Vitamin B-12: 453 pg/mL (ref 211–911)

## 2011-07-06 MED ORDER — POTASSIUM & SODIUM PHOSPHATES 280-160-250 MG PO PACK
1.0000 | PACK | Freq: Three times a day (TID) | ORAL | Status: AC
  Administered 2011-07-06 – 2011-07-07 (×3): 1 via ORAL
  Filled 2011-07-06 (×3): qty 1

## 2011-07-06 MED ORDER — MAGNESIUM SULFATE 40 MG/ML IJ SOLN
2.0000 g | Freq: Once | INTRAMUSCULAR | Status: AC
  Administered 2011-07-06: 2 g via INTRAVENOUS
  Filled 2011-07-06: qty 50

## 2011-07-06 MED ORDER — AMLODIPINE BESYLATE 5 MG PO TABS
5.0000 mg | ORAL_TABLET | Freq: Every day | ORAL | Status: DC
  Administered 2011-07-06 – 2011-07-09 (×4): 5 mg via ORAL
  Filled 2011-07-06 (×4): qty 1

## 2011-07-06 NOTE — Progress Notes (Signed)
Shirley Strickland is a 77 y.o. female patient.  SUBJECTIVE Feels somewhat better but says her leg are heavy.   1. Syncope   2. Fall   3. HTN (hypertension)   4. A-fib     Past Medical History  Diagnosis Date  . 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 Cancer     pt states only skin cancer  . Numbness of legs     started after fall yesterday 07/03/11  . Bruise     bruise on left foot from fall 07/03/11  . Knee pain, acute 07/04/11    Pt states left knee twisted under her in fall 07/03/11 and now painful  . Rash and nonspecific skin eruption 07/04/11    rash on bilateral upper legs  . Syncope   . Fall   . HTN (hypertension)   . A-fib    Current Facility-Administered Medications  Medication Dose Route Frequency Provider Last Rate Last Dose  . ciprofloxacin (CIPRO) IVPB 400 mg  400 mg Intravenous Q12H Randall K Absher, PHARMD   400 mg at 07/06/11 1258  . famotidine (PEPCID) tablet 20 mg  20 mg Oral Daily Vickki Igou   20 mg at 07/06/11 0932  . olmesartan (BENICAR) tablet 40 mg  40 mg Oral Daily Hind I. Elsaid   40 mg at 07/06/11 0932  . spiritus frumenti (ethyl alcohol) solution 1 each  1 each Oral Daily PRN Lorenza Evangelist, PHARMD   1 each at 07/05/11 1929  . DISCONTD: heparin injection 5,000 Units  5,000 Units Subcutaneous Q8H Hind I. Elsaid   5,000 Units at 07/06/11 1326   Allergies  Allergen Reactions  . Biaxin Nausea Only  . Cod (Fish Allergy)   . Codeine Nausea And Vomiting  . Darvocet (Propoxyphene N-Acetaminophen)   . Demadex (Torsemide) Other (See Comments)    UNKNOWN  . Diovan Hct (Valsartan-Hydrochlorothiazide) Other (See Comments)    unknown  . Ex-Lax (Senna) Other (See Comments)    unknown  . Ibuprofen Nausea Only  . Indanediones Other (See Comments)    unknown  . Indapamide Other (See Comments)    unknown  . Micardis (Telmisartan)     Can't take generic - makes pt nauseated  . Morphine And Related Nausea And Vomiting  . Tessalon   . Penicillins Rash    . Septra (Bactrim) Rash   Active Problems:  Syncope  Fall  HTN (hypertension)  A-fib   Vital signs in last 24 hours: Temp:  [98.5 F (36.9 C)-99.4 F (37.4 C)] 99.1 F (37.3 C) (11/11 1453) Pulse Rate:  [87-96] 94  (11/11 1453) Resp:  [16-20] 20  (11/11 1453) BP: (144-174)/(66-75) 174/66 mmHg (11/11 1453) SpO2:  [97 %-98 %] 98 % (11/11 1453) Weight change:  Last BM Date: 07/03/11  Intake/Output from previous day: 11/10 0701 - 11/11 0700 In: 480 [P.O.:480] Out: 750 [Urine:750] Intake/Output this shift: Total I/O In: -  Out: 425 [Urine:425]  Lab Results:  [facility] 07/06/11 0540 07/04/11 1727  WBC 10.6* 11.1*  HGB 8.4* 9.9*  HCT 25.2* 29.4*  PLT 155 200   BMET  Basename 07/06/11 0540 07/05/11 0030  NA 128* 131*  K 3.7 4.0  CL 98 100  CO2 23 23  GLUCOSE 133* 118*  BUN 11 15  CREATININE 0.82 0.98  CALCIUM 8.5 9.0    Studies/Results: Dg Hip Bilateral W/pelvis  07/04/2011  *RADIOLOGY REPORT*  Clinical Data: Bilateral hip pain and stiffness secondary to a fall today.  BILATERAL  HIP WITH PELVIS - 4+ VIEW  Comparison: None.  Findings: No fracture, dislocation, arthritis, or other significant abnormality of the hips or pelvis.  IMPRESSION: Normal exam.  Original Report Authenticated By: Gwynn Burly, M.D.   Dg Ankle 2 Views Left  07/04/2011  *RADIOLOGY REPORT*  Clinical Data: Fall, left ankle pain/bruising  LEFT ANKLE - 2 VIEW  Comparison: None.  Findings: No definite fracture is seen.  Mild irregularity of the medial malleolus.  The visualized soft tissues are grossly unremarkable.  IMPRESSION: No definite fracture is seen.  Mild irregularity of the medial malleolus.  Correlate with the site of the patient's pain.  Original Report Authenticated By: Charline Bills, M.D.   Ct Head Wo Contrast  07/04/2011  *RADIOLOGY REPORT*  Clinical Data: The patient fell.  CT HEAD WITHOUT CONTRAST  Technique:  Contiguous axial images were obtained from the base of the skull  through the vertex without contrast.  Comparison: None.  Findings: There is no acute intracranial hemorrhage, infarction, or mass lesion.  Mild cerebral cortical atrophy.  Prominent venous lakes in the occipital bone, not felt to be significant.  IMPRESSION: No acute intracranial abnormality.  Mild atrophy.  Original Report Authenticated By: Gwynn Burly, M.D.   Dg Foot 2 Views Left  07/04/2011  *RADIOLOGY REPORT*  Clinical Data: Fall, left foot pain, bruising on the third through fifth toes and ankle  LEFT FOOT - 2 VIEW  Comparison: None.  Findings: No evidence of acute fracture or dislocation.  Deformity of the third, fourth, and fifth metatarsals, suggesting prior fracture.  The visualized soft tissues are unremarkable.  IMPRESSION: No evidence of acute fracture or dislocation.  Old fractures of the left third through fifth metatarsals.  Original Report Authenticated By: Charline Bills, M.D.    Medications: I have reviewed the patient's current medications.   Physical exam GENERAL- alert, well and happy HEAD- normal atraumatic, normal thyroid, no jvd RESPIRATORY- chest clear, no wheezing, crepitations, rhonchi, normal symmetric air entry CVS- regular rate and rhythm, S1, S2 normal, no murmur, click, rub or gallop ABDOMEN- abdomen is soft without significant tenderness, masses, organomegaly or guarding NEURO- reduecd power both legs. EXTREMITIES- extremities normal, atraumatic, no cyanosis or edema and no edema, redness or tenderness in the calves or thighs  Plan 1. Syncope/Fall- some improvement. Needs STR. Follow CSW in am. Patient OK with STR.  2. UTI -tolerating Cipro. 3. HTN (hypertension)- uncontrolled. Add calcium channel blocker. 4. A-fib- rate controlled.  5. Anemia- ? hemo dilutional on chronic anemia? Anemia panel/stool hemoccult.     Rayel Santizo 07/06/2011 3:28 PM Pager: [phone].

## 2011-07-06 NOTE — Plan of Care (Signed)
Problem: Phase I Progression Outcomes Goal: Pain controlled with appropriate interventions Patient will be free of pain within 24hrs.  Patient will remain pain free upon discharge.

## 2011-07-07 DIAGNOSIS — M8448XA Pathological fracture, other site, initial encounter for fracture: Secondary | ICD-10-CM | POA: Diagnosis present

## 2011-07-07 LAB — COMPREHENSIVE METABOLIC PANEL
ALT: 20 U/L (ref 0–35)
Alkaline Phosphatase: 62 U/L (ref 39–117)
BUN: 8 mg/dL (ref 6–23)
CO2: 25 mEq/L (ref 19–32)
GFR calc Af Amer: 83 mL/min — ABNORMAL LOW (ref >90)
GFR calc non Af Amer: 72 mL/min — ABNORMAL LOW (ref >90)
Glucose, Bld: 138 mg/dL — ABNORMAL HIGH (ref 70–99)
Potassium: 3.9 mEq/L (ref 3.5–5.1)
Sodium: 130 mEq/L — ABNORMAL LOW (ref 135–145)
Total Bilirubin: 0.4 mg/dL (ref 0.3–1.2)

## 2011-07-07 LAB — CBC
HCT: 25.9 % — ABNORMAL LOW (ref 36.0–46.0)
Hemoglobin: 8.6 g/dL — ABNORMAL LOW (ref 12.0–15.0)
RBC: 3.05 MIL/uL — ABNORMAL LOW (ref 3.87–5.11)

## 2011-07-07 MED ORDER — CIPROFLOXACIN HCL 500 MG PO TABS
500.0000 mg | ORAL_TABLET | Freq: Two times a day (BID) | ORAL | Status: DC
  Administered 2011-07-07 – 2011-07-10 (×8): 500 mg via ORAL
  Filled 2011-07-07 (×10): qty 1

## 2011-07-07 NOTE — Progress Notes (Signed)
Subjective: "I feel ok, but i would like to get up. Am having some pain in 'my tailbone'".  Objective: Vital signs Filed Vitals:   07/06/11 1453 07/06/11 2154 07/06/11 2310 07/07/11 0502  BP: 174/66 181/83 160/82 164/52  Pulse: 94 112  106  Temp: 99.1 F (37.3 C) 98.8 F (37.1 C)  98.8 F (37.1 C)  TempSrc: Oral Oral  Oral  Resp: 20 18  16   Height:      Weight:      SpO2: 98% 96%  93%   Weight change:  Last BM Date: 07/03/11  Intake/Output from previous day: 11/11 0701 - 11/12 0700 In: 610 [P.O.:160; IV Piggyback:450] Out: 2575 [Urine:2575] Total I/O In: 360 [P.O.:360] Out: 100 [Urine:100]   Physical Exam: General: Alert, awake, oriented x3, in no acute distress. HEENT: No bruits, no goiter. Heart: Regular rate and rhythm, without murmurs, rubs, gallops. Lungs: Clear to auscultation bilaterally. Abdomen: Soft, nontender, nondistended, positive bowel sounds. Extremities: No clubbing cyanosis or edema with positive pedal pulses. Neuro: Grossly intact, nonfocal.    Lab Results: Basic Metabolic Panel:  Basename 07/07/11 0453 07/06/11 0540 07/05/11 0030  NA 130* 128* --  K 3.9 3.7 --  CL 99 98 --  CO2 25 23 --  GLUCOSE 138* 133* --  BUN 8 11 --  CREATININE 0.72 0.82 --  CALCIUM 8.2* 8.5 --  MG -- 1.4* 1.5  PHOS -- 1.9* 2.0*   Liver Function Tests:  Basename 07/07/11 0453 07/06/11 0540  AST 21 16  ALT 20 11  ALKPHOS 62 50  BILITOT 0.4 0.4  PROT 5.3* 5.0*  ALBUMIN 2.5* 2.5*   No results found for this basename: LIPASE:2,AMYLASE:2 in the last 72 hours No results found for this basename: AMMONIA:2 in the last 72 hours CBC:  Basename 07/07/11 0453 07/06/11 0540  WBC 11.6* 10.6*  NEUTROABS -- --  HGB 8.6* 8.4*  HCT 25.9* 25.2*  MCV 84.9 86.0  PLT 173 155   Cardiac Enzymes:  Basename 07/05/11 1605 07/05/11 0800 07/05/11 0030  CKTOTAL 123 121 171  CKMB 4.0 3.0 3.8  CKMBINDEX -- -- --  TROPONINI <0.30 <0.30 <0.30   BNP: No results found for  this basename: POCBNP:3 in the last 72 hours D-Dimer: No results found for this basename: DDIMER:2 in the last 72 hours CBG: No results found for this basename: GLUCAP:6 in the last 72 hours Hemoglobin A1C: No results found for this basename: HGBA1C in the last 72 hours Fasting Lipid Panel: No results found for this basename: CHOL,HDL,LDLCALC,TRIG,CHOLHDL,LDLDIRECT in the last 72 hours Thyroid Function Tests:  Basename 07/05/11 0030  TSH 1.555  T4TOTAL --  FREET4 --  T3FREE --  THYROIDAB --   Anemia Panel:  Basename 07/06/11 0540  VITAMINB12 453  FOLATE --  FERRITIN --  TIBC --  IRON --  RETICCTPCT --   Coagulation: No results found for this basename: LABPROT:2,INR:2 in the last 72 hours Urine Drug Screen:  Alcohol Level: No results found for this basename: ETH:2 in the last 72 hours Urinalysis:  Misc. Labs:  No results found for this or any previous visit (from the past 240 hour(s)).  Studies/Results: Mr Lumbar Spine Wo Contrast  07/06/2011  *RADIOLOGY REPORT*  Clinical Data:  Leg weakness.  Rule out spinal stenosis.  MRI LUMBAR SPINE WITHOUT CONTRAST  Technique:  Multiplanar and multiecho pulse sequences of the lumbar spine were obtained without intravenous contrast.  Comparison:  Lumbar spine radiographs 07/04/2011.  Findings:  Normal signal is present  in the conus medullaris which terminates at T12-L1.  Leftward curvature of the lumbar spine is centered at L3-4.  Superior endplate fractures at L3 and L4 are remote.  No acute or healing fractures are present.  Minimal endplate edematous changes are noted along the superior endplate of L5.  Slight anterolisthesis is present at L2-3.  Degenerative retrolisthesis at L4-5 measures 3 mm.  Limited imaging of the abdomen is unremarkable.  The disc levels are as follows.  T12-L1:  A far left lateral disc bulge is present.  There is no significant stenosis.  L1-2:  Leftward disc bulging is present.  The disc bulges into the  inferior recess of the left neural foramen without significant stenosis.  L2-3:  Broad-based disc bulging is present.  There is uncovering of the disc associated with the anterolisthesis.  This results in mild right lateral recess narrowing.  The foramina are patent bilaterally.  L3-4:  Broad-based disc bulging is more prominent on the right. Facet hypertrophy is noted as well.  This results in moderate right lateral recess and foraminal stenosis.  The left foramen is patent.  L4-5:  Broad-based disc bulging is present.  Lateral recess narrowing is worse right than left.  Moderate to severe left and moderate right foraminal stenosis is present.  L5-S1:  Broad-based disc bulging is present.  Mild lateral recess narrowing is present bilaterally.  The foramina are patent. Moderate facet hypertrophy is noted.  IMPRESSION:  1.  Leftward curvature of lumbar spine is centered at L3-4. 2.  Superior endplate fractures at L3 and L4 are remote. 3.  Mild right lateral recess narrowing at L2-3. 4.  Moderate right lateral recess and foraminal stenosis at L3-4. 5.  Moderate to severe left and moderate right foraminal stenosis at L4-5. 6.  Lateral recess narrowing at L4-5 is worse right than left.  7.  Mild lateral recess narrowing bilaterally at L5-S1.  *RADIOLOGY REPORT*  MRI SACRUM WITHOUT CONTRAST  Technique:  Multiplanar and multiecho pulse sequences of the sacrum to include the sacroiliac joints and the coccyx were obtained without intravenous contrast.  Findings:  There is focal T1 signal loss and edema within the last sacral segment or proximal coccyx.  There is no displacement.  This likely represents a nondisplaced fracture.  There is no significant soft tissue injury surrounding.  The hips are within normal limits.  The sacral foramina are patent bilaterally.  The SI joints are unremarkable.  IMPRESSION:  1.  Nondisplaced fracture of the distal sacrum and proximal coccyx.  Original Report Authenticated By: Jamesetta Orleans.  MATTERN, M.D.   Mr Sacrum/si Joints Wo Contrast  07/06/2011  *RADIOLOGY REPORT*  Clinical Data:  Leg weakness.  Rule out spinal stenosis.  MRI LUMBAR SPINE WITHOUT CONTRAST  Technique:  Multiplanar and multiecho pulse sequences of the lumbar spine were obtained without intravenous contrast.  Comparison:  Lumbar spine radiographs 07/04/2011.  Findings:  Normal signal is present in the conus medullaris which terminates at T12-L1.  Leftward curvature of the lumbar spine is centered at L3-4.  Superior endplate fractures at L3 and L4 are remote.  No acute or healing fractures are present.  Minimal endplate edematous changes are noted along the superior endplate of L5.  Slight anterolisthesis is present at L2-3.  Degenerative retrolisthesis at L4-5 measures 3 mm.  Limited imaging of the abdomen is unremarkable.  The disc levels are as follows.  T12-L1:  A far left lateral disc bulge is present.  There is no significant stenosis.  L1-2:  Leftward disc bulging is present.  The disc bulges into the inferior recess of the left neural foramen without significant stenosis.  L2-3:  Broad-based disc bulging is present.  There is uncovering of the disc associated with the anterolisthesis.  This results in mild right lateral recess narrowing.  The foramina are patent bilaterally.  L3-4:  Broad-based disc bulging is more prominent on the right. Facet hypertrophy is noted as well.  This results in moderate right lateral recess and foraminal stenosis.  The left foramen is patent.  L4-5:  Broad-based disc bulging is present.  Lateral recess narrowing is worse right than left.  Moderate to severe left and moderate right foraminal stenosis is present.  L5-S1:  Broad-based disc bulging is present.  Mild lateral recess narrowing is present bilaterally.  The foramina are patent. Moderate facet hypertrophy is noted.  IMPRESSION:  1.  Leftward curvature of lumbar spine is centered at L3-4. 2.  Superior endplate fractures at L3 and L4 are  remote. 3.  Mild right lateral recess narrowing at L2-3. 4.  Moderate right lateral recess and foraminal stenosis at L3-4. 5.  Moderate to severe left and moderate right foraminal stenosis at L4-5. 6.  Lateral recess narrowing at L4-5 is worse right than left.  7.  Mild lateral recess narrowing bilaterally at L5-S1.  *RADIOLOGY REPORT*  MRI SACRUM WITHOUT CONTRAST  Technique:  Multiplanar and multiecho pulse sequences of the sacrum to include the sacroiliac joints and the coccyx were obtained without intravenous contrast.  Findings:  There is focal T1 signal loss and edema within the last sacral segment or proximal coccyx.  There is no displacement.  This likely represents a nondisplaced fracture.  There is no significant soft tissue injury surrounding.  The hips are within normal limits.  The sacral foramina are patent bilaterally.  The SI joints are unremarkable.  IMPRESSION:  1.  Nondisplaced fracture of the distal sacrum and proximal coccyx.  Original Report Authenticated By: Jamesetta Orleans. MATTERN, M.D.    Medications: Scheduled Meds:   . amLODipine  5 mg Oral Daily  . ciprofloxacin  400 mg Intravenous Q12H  . famotidine  20 mg Oral Daily  . magnesium sulfate IVPB  2 g Intravenous Once  . olmesartan  40 mg Oral Daily  . potassium & sodium phosphates  1 packet Oral TID WC & HS  . DISCONTD: heparin  5,000 Units Subcutaneous Q8H   Continuous Infusions:  PRN Meds:.spiritus frumenti  Assessment/Plan:  Active Problems: 1 Syncope: No further s/sx. Likely due to UTI/peripheral neuropathy. PT/OT 2. Fall: likely due to weakness from UTI. Remains weak. Will request PT/OT 3. UTI. Cipro day #3. Will change to po 4. Sacral fx: pain mngmt. PT. Will request Ortho consult. 5. HTN (hypertension): Fair control continue home meds 6. Afib rate controlled.  7. Mild hyponataremia: improving will monitor    LOS: 3 days   [facility] M 07/07/2011, 11:39 AM  Saw and examined Ms Streed at bedside.  Discussed plan of care with Clydie Braun. Patient requests removal of telemetry and regular diet. i think this is reasonable as she has been monitored for more than 48 hours with no arrhythmic events.

## 2011-07-07 NOTE — Consult Note (Signed)
Reason for Consult: Lower back/sacral pain, and weakness. Referring Physician: [facility]  Shirley Strickland is an 77 y.o. female.  HPI: 77 year old woman who is had multiple recent falls, who complains of weakness in her legs. More recently, she fell directly on her tailbone, and has had difficulty walking. She did not lose consciousness. She says that she was having some dizziness prior to her fall as well. She normally lives independently, but has had progressive deterioration in her independent capacity. She denies any loss of bowel or bladder function. She also said that her sensation has been poor for a long time, and she has been told that she has neuropathy.  Past Medical History  Diagnosis Date  . 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 Cancer     pt states only skin cancer  . Numbness of legs     started after fall yesterday 07/03/11  . Bruise     bruise on left foot from fall 07/03/11  . Knee pain, acute 07/04/11    Pt states left knee twisted under her in fall 07/03/11 and now painful  . Rash and nonspecific skin eruption 07/04/11    rash on bilateral upper legs  . Syncope   . Fall   . HTN (hypertension)   . A-fib     Past Surgical History  Procedure Date  . Elbow surgery     right  . Appendectomy   . Exploratory laparotomy 07/04/11    years ago to find cause of bleeding    History reviewed. No pertinent family history.  Social History:  reports that she has never smoked. She has never used smokeless tobacco. She reports that she drinks about 1.2 ounces of alcohol per week. She reports that she uses illicit drugs.  Allergies:  Allergies  Allergen Reactions  . Biaxin Nausea Only  . Cod (Fish Allergy)   . Codeine Nausea And Vomiting  . Darvocet (Propoxyphene N-Acetaminophen)   . Demadex (Torsemide) Other (See Comments)    UNKNOWN  . Diovan Hct (Valsartan-Hydrochlorothiazide) Other (See Comments)    unknown  . Ex-Lax (Senna) Other (See Comments)   unknown  . Ibuprofen Nausea Only  . Indanediones Other (See Comments)    unknown  . Indapamide Other (See Comments)    unknown  . Micardis (Telmisartan)     Can't take generic - makes pt nauseated  . Morphine And Related Nausea And Vomiting  . Tessalon   . Penicillins Rash  . Septra (Bactrim) Rash     Results for orders placed during the hospital encounter of 07/04/11 (from the past 48 hour(s))  CBC     Status: Abnormal   Collection Time   07/06/11  5:40 AM      Component Value Range Comment   WBC 10.6 (*) 4.0 - 10.5 (K/uL)    RBC 2.93 (*) 3.87 - 5.11 (MIL/uL)    Hemoglobin 8.4 (*) 12.0 - 15.0 (g/dL)    HCT 40.9 (*) 81.1 - 46.0 (%)    MCV 86.0  78.0 - 100.0 (fL)    MCH 28.7  26.0 - 34.0 (pg)    MCHC 33.3  30.0 - 36.0 (g/dL)    RDW 91.4  78.2 - 95.6 (%)    Platelets 155  150 - 400 (K/uL)   COMPREHENSIVE METABOLIC PANEL     Status: Abnormal   Collection Time   07/06/11  5:40 AM      Component Value Range Comment   Sodium 128 (*)  135 - 145 (mEq/L)    Potassium 3.7  3.5 - 5.1 (mEq/L)    Chloride 98  96 - 112 (mEq/L)    CO2 23  19 - 32 (mEq/L)    Glucose, Bld 133 (*) 70 - 99 (mg/dL)    BUN 11  6 - 23 (mg/dL)    Creatinine, Ser 4.09  0.50 - 1.10 (mg/dL)    Calcium 8.5  8.4 - 10.5 (mg/dL)    Total Protein 5.0 (*) 6.0 - 8.3 (g/dL)    Albumin 2.5 (*) 3.5 - 5.2 (g/dL)    AST 16  0 - 37 (U/L)    ALT 11  0 - 35 (U/L)    Alkaline Phosphatase 50  39 - 117 (U/L)    Total Bilirubin 0.4  0.3 - 1.2 (mg/dL)    GFR calc non Af Amer 60 (*) >90 (mL/min)    GFR calc Af Amer 69 (*) >90 (mL/min)   MAGNESIUM     Status: Abnormal   Collection Time   07/06/11  5:40 AM      Component Value Range Comment   Magnesium 1.4 (*) 1.5 - 2.5 (mg/dL)   PHOSPHORUS     Status: Abnormal   Collection Time   07/06/11  5:40 AM      Component Value Range Comment   Phosphorus 1.9 (*) 2.3 - 4.6 (mg/dL)   VITAMIN W11     Status: Normal   Collection Time   07/06/11  5:40 AM      Component Value Range  Comment   Vitamin B-12 453  211 - 911 (pg/mL)   RPR     Status: Normal   Collection Time   07/06/11  5:40 AM      Component Value Range Comment   RPR NON REACTIVE  NON REACTIVE    CBC     Status: Abnormal   Collection Time   07/07/11  4:53 AM      Component Value Range Comment   WBC 11.6 (*) 4.0 - 10.5 (K/uL)    RBC 3.05 (*) 3.87 - 5.11 (MIL/uL)    Hemoglobin 8.6 (*) 12.0 - 15.0 (g/dL)    HCT 91.4 (*) 78.2 - 46.0 (%)    MCV 84.9  78.0 - 100.0 (fL)    MCH 28.2  26.0 - 34.0 (pg)    MCHC 33.2  30.0 - 36.0 (g/dL)    RDW 95.6  21.3 - 08.6 (%)    Platelets 173  150 - 400 (K/uL)   COMPREHENSIVE METABOLIC PANEL     Status: Abnormal   Collection Time   07/07/11  4:53 AM      Component Value Range Comment   Sodium 130 (*) 135 - 145 (mEq/L)    Potassium 3.9  3.5 - 5.1 (mEq/L)    Chloride 99  96 - 112 (mEq/L)    CO2 25  19 - 32 (mEq/L)    Glucose, Bld 138 (*) 70 - 99 (mg/dL)    BUN 8  6 - 23 (mg/dL)    Creatinine, Ser 5.78  0.50 - 1.10 (mg/dL)    Calcium 8.2 (*) 8.4 - 10.5 (mg/dL)    Total Protein 5.3 (*) 6.0 - 8.3 (g/dL)    Albumin 2.5 (*) 3.5 - 5.2 (g/dL)    AST 21  0 - 37 (U/L)    ALT 20  0 - 35 (U/L)    Alkaline Phosphatase 62  39 - 117 (U/L)    Total Bilirubin 0.4  0.3 - 1.2 (mg/dL)    GFR calc non Af Amer 72 (*) >90 (mL/min)    GFR calc Af Amer 83 (*) >90 (mL/min)     Mr Lumbar Spine Wo Contrast  07/06/2011  *RADIOLOGY REPORT*  Clinical Data:  Leg weakness.  Rule out spinal stenosis.  MRI LUMBAR SPINE WITHOUT CONTRAST  Technique:  Multiplanar and multiecho pulse sequences of the lumbar spine were obtained without intravenous contrast.  Comparison:  Lumbar spine radiographs 07/04/2011.  Findings:  Normal signal is present in the conus medullaris which terminates at T12-L1.  Leftward curvature of the lumbar spine is centered at L3-4.  Superior endplate fractures at L3 and L4 are remote.  No acute or healing fractures are present.  Minimal endplate edematous changes are noted along  the superior endplate of L5.  Slight anterolisthesis is present at L2-3.  Degenerative retrolisthesis at L4-5 measures 3 mm.  Limited imaging of the abdomen is unremarkable.  The disc levels are as follows.  T12-L1:  A far left lateral disc bulge is present.  There is no significant stenosis.  L1-2:  Leftward disc bulging is present.  The disc bulges into the inferior recess of the left neural foramen without significant stenosis.  L2-3:  Broad-based disc bulging is present.  There is uncovering of the disc associated with the anterolisthesis.  This results in mild right lateral recess narrowing.  The foramina are patent bilaterally.  L3-4:  Broad-based disc bulging is more prominent on the right. Facet hypertrophy is noted as well.  This results in moderate right lateral recess and foraminal stenosis.  The left foramen is patent.  L4-5:  Broad-based disc bulging is present.  Lateral recess narrowing is worse right than left.  Moderate to severe left and moderate right foraminal stenosis is present.  L5-S1:  Broad-based disc bulging is present.  Mild lateral recess narrowing is present bilaterally.  The foramina are patent. Moderate facet hypertrophy is noted.  IMPRESSION:  1.  Leftward curvature of lumbar spine is centered at L3-4. 2.  Superior endplate fractures at L3 and L4 are remote. 3.  Mild right lateral recess narrowing at L2-3. 4.  Moderate right lateral recess and foraminal stenosis at L3-4. 5.  Moderate to severe left and moderate right foraminal stenosis at L4-5. 6.  Lateral recess narrowing at L4-5 is worse right than left.  7.  Mild lateral recess narrowing bilaterally at L5-S1.  *RADIOLOGY REPORT*  MRI SACRUM WITHOUT CONTRAST  Technique:  Multiplanar and multiecho pulse sequences of the sacrum to include the sacroiliac joints and the coccyx were obtained without intravenous contrast.  Findings:  There is focal T1 signal loss and edema within the last sacral segment or proximal coccyx.  There is no  displacement.  This likely represents a nondisplaced fracture.  There is no significant soft tissue injury surrounding.  The hips are within normal limits.  The sacral foramina are patent bilaterally.  The SI joints are unremarkable.  IMPRESSION:  1.  Nondisplaced fracture of the distal sacrum and proximal coccyx.  Original Report Authenticated By: Jamesetta Orleans. MATTERN, M.D.   Mr Sacrum/si Joints Wo Contrast  07/06/2011  *RADIOLOGY REPORT*  Clinical Data:  Leg weakness.  Rule out spinal stenosis.  MRI LUMBAR SPINE WITHOUT CONTRAST  Technique:  Multiplanar and multiecho pulse sequences of the lumbar spine were obtained without intravenous contrast.  Comparison:  Lumbar spine radiographs 07/04/2011.  Findings:  Normal signal is present in the conus medullaris which terminates at T12-L1.  Leftward curvature of the lumbar spine is centered at L3-4.  Superior endplate fractures at L3 and L4 are remote.  No acute or healing fractures are present.  Minimal endplate edematous changes are noted along the superior endplate of L5.  Slight anterolisthesis is present at L2-3.  Degenerative retrolisthesis at L4-5 measures 3 mm.  Limited imaging of the abdomen is unremarkable.  The disc levels are as follows.  T12-L1:  A far left lateral disc bulge is present.  There is no significant stenosis.  L1-2:  Leftward disc bulging is present.  The disc bulges into the inferior recess of the left neural foramen without significant stenosis.  L2-3:  Broad-based disc bulging is present.  There is uncovering of the disc associated with the anterolisthesis.  This results in mild right lateral recess narrowing.  The foramina are patent bilaterally.  L3-4:  Broad-based disc bulging is more prominent on the right. Facet hypertrophy is noted as well.  This results in moderate right lateral recess and foraminal stenosis.  The left foramen is patent.  L4-5:  Broad-based disc bulging is present.  Lateral recess narrowing is worse right than  left.  Moderate to severe left and moderate right foraminal stenosis is present.  L5-S1:  Broad-based disc bulging is present.  Mild lateral recess narrowing is present bilaterally.  The foramina are patent. Moderate facet hypertrophy is noted.  IMPRESSION:  1.  Leftward curvature of lumbar spine is centered at L3-4. 2.  Superior endplate fractures at L3 and L4 are remote. 3.  Mild right lateral recess narrowing at L2-3. 4.  Moderate right lateral recess and foraminal stenosis at L3-4. 5.  Moderate to severe left and moderate right foraminal stenosis at L4-5. 6.  Lateral recess narrowing at L4-5 is worse right than left.  7.  Mild lateral recess narrowing bilaterally at L5-S1.  *RADIOLOGY REPORT*  MRI SACRUM WITHOUT CONTRAST  Technique:  Multiplanar and multiecho pulse sequences of the sacrum to include the sacroiliac joints and the coccyx were obtained without intravenous contrast.  Findings:  There is focal T1 signal loss and edema within the last sacral segment or proximal coccyx.  There is no displacement.  This likely represents a nondisplaced fracture.  There is no significant soft tissue injury surrounding.  The hips are within normal limits.  The sacral foramina are patent bilaterally.  The SI joints are unremarkable.  IMPRESSION:  1.  Nondisplaced fracture of the distal sacrum and proximal coccyx.  Original Report Authenticated By: Jamesetta Orleans. MATTERN, M.D.    Review of Systems  Constitutional: Negative.   HENT: Negative.   Respiratory: Negative.   Cardiovascular: Negative.   Genitourinary: Negative.        She denies any loss of bowel or bladder function  Musculoskeletal: Positive for back pain and falls.  Skin:       Positive for easy bruising  Neurological: Positive for sensory change and focal weakness.       She reports recent loss of sensation around her legs, however this has been chronic. She says that her legs have felt significantly weaker in the last couple of weeks.    Endo/Heme/Allergies: Bruises/bleeds easily.  Psychiatric/Behavioral: Negative.   All other systems reviewed and are negative.   Blood pressure 164/52, pulse 106, temperature 98.8 F (37.1 C), temperature source Oral, resp. rate 16, height 5\' 2"  (1.575 m), weight 61.6 kg (135 lb 12.9 oz), SpO2 93.00%. Physical Exam  Constitutional: She appears well-developed.  HENT:  Head: Normocephalic and  atraumatic.  Eyes: EOM are normal.  Neck: Normal range of motion.  Respiratory: Effort normal.  GI: Soft.  Musculoskeletal:       She has tenderness at her lower sacrum, and has mild subjective weakness in both of her lower tremors with ankle dorsiflexion and plantarflexion. She is able to dorsiflex and plantar flex, and her reflexes are symmetric in her patellar tendons as well as her Achilles bilaterally. I do not appreciate any significant actual motor loss.  Neurological:       She reports sensation intact throughout her legs, and also in her perineal region.  Skin: Skin is warm.       She has bruising bilaterally in both of her pretibial areas  Psychiatric: She has a normal mood and affect. Her behavior is normal.    Assessment/Plan: Sacral insufficiency fracture with pre-existing lumbar spinal stenosis as well as multilevel foraminal stenosis with degenerative disease.  I do not believe that she has had an acute neurologic dysfunction secondary to her sacral fracture. I suspect that she has had a progressive neurologic and musculoskeletal weakness, secondary to age and deconditioning. I don't see any evidence for cauda equina syndrome. I do not think that she would benefit from neurosurgical intervention.  I would recommend formal physical therapy to optimize strength and gait training, and hopefully be able to recover some level of independent function. I would also recommend management of osteoporosis with calcium and vitamin D, and bone density testing as per indicated by her primary care  physician as an outpatient.  She can be weightbearing as tolerated, and did not need any special bracing or precautions, and will plan to be working with physical therapy.  I would be happy to see her as an outpatient in the next 2-3 weeks.  Please call with additional questions.  Amalia Edgecombe P 07/07/2011, 9:18 PM

## 2011-07-08 LAB — CBC
MCHC: 33.3 g/dL (ref 30.0–36.0)
Platelets: 207 10*3/uL (ref 150–400)
RDW: 13.2 % (ref 11.5–15.5)

## 2011-07-08 LAB — BASIC METABOLIC PANEL
BUN: 9 mg/dL (ref 6–23)
GFR calc Af Amer: 81 mL/min — ABNORMAL LOW (ref >90)
GFR calc non Af Amer: 70 mL/min — ABNORMAL LOW (ref >90)
Potassium: 4.1 mEq/L (ref 3.5–5.1)
Sodium: 129 mEq/L — ABNORMAL LOW (ref 135–145)

## 2011-07-08 MED ORDER — CALCIUM CARBONATE-VITAMIN D 500-200 MG-UNIT PO TABS
1.0000 | ORAL_TABLET | Freq: Two times a day (BID) | ORAL | Status: DC
  Administered 2011-07-08 (×2): 1 via ORAL
  Administered 2011-07-09: 11:00:00 via ORAL
  Administered 2011-07-09 – 2011-07-10 (×2): 1 via ORAL
  Filled 2011-07-08 (×11): qty 1

## 2011-07-08 MED ORDER — METOPROLOL TARTRATE 12.5 MG HALF TABLET
12.5000 mg | ORAL_TABLET | Freq: Two times a day (BID) | ORAL | Status: DC
  Administered 2011-07-08 – 2011-07-10 (×5): 12.5 mg via ORAL
  Filled 2011-07-08 (×9): qty 1

## 2011-07-08 MED ORDER — POLYETHYLENE GLYCOL 3350 17 G PO PACK
17.0000 g | PACK | Freq: Every day | ORAL | Status: DC
  Administered 2011-07-08: 18:00:00 via ORAL
  Administered 2011-07-09 – 2011-07-10 (×2): 17 g via ORAL
  Filled 2011-07-08 (×5): qty 1

## 2011-07-08 NOTE — Progress Notes (Signed)
Subjective: "I'm feeling much better. " No complaints.   Objective: Vital signs Filed Vitals:   07/06/11 2310 07/07/11 0502 07/07/11 2137 07/08/11 0438  BP: 160/82 164/52 179/72 171/74  Pulse:  106 97 81  Temp:  98.8 F (37.1 C) 99.3 F (37.4 C) 98.9 F (37.2 C)  TempSrc:  Oral Oral Oral  Resp:  16 18 16   Height:      Weight:      SpO2:  93% 96% 91%   Weight change:  Last BM Date: 07/07/11  Intake/Output from previous day: 11/12 0701 - 11/13 0700 In: 820 [P.O.:720; I.V.:100] Out: 925 [Urine:925] Total I/O In: 120 [P.O.:120] Out: -    Physical Exam: General: Alert, awake, oriented x3, in no acute distress. HEENT: No bruits, no goiter. Heart: Regular rate and rhythm, without murmurs, rubs, gallops. Lungs: Clear to auscultation bilaterally. Abdomen: Soft, nontender, nondistended, positive bowel sounds. Extremities: No clubbing cyanosis or edema with positive pedal pulses. Neuro: Grossly intact, nonfocal.    Lab Results: Basic Metabolic Panel:  Basename 07/08/11 0505 07/07/11 0453 07/06/11 0540  NA 129* 130* --  K 4.1 3.9 --  CL 97 99 --  CO2 25 25 --  GLUCOSE 129* 138* --  BUN 9 8 --  CREATININE 0.77 0.72 --  CALCIUM 8.4 8.2* --  MG -- -- 1.4*  PHOS -- -- 1.9*   Liver Function Tests:  Basename 07/07/11 0453 07/06/11 0540  AST 21 16  ALT 20 11  ALKPHOS 62 50  BILITOT 0.4 0.4  PROT 5.3* 5.0*  ALBUMIN 2.5* 2.5*   No results found for this basename: LIPASE:2,AMYLASE:2 in the last 72 hours No results found for this basename: AMMONIA:2 in the last 72 hours CBC:  Basename 07/08/11 0505 07/07/11 0453  WBC 13.0* 11.6*  NEUTROABS -- --  HGB 8.8* 8.6*  HCT 26.4* 25.9*  MCV 85.2 84.9  PLT 207 173   Cardiac Enzymes:  Basename 07/05/11 1605  CKTOTAL 123  CKMB 4.0  CKMBINDEX --  TROPONINI <0.30   BNP: No results found for this basename: POCBNP:3 in the last 72 hours D-Dimer: No results found for this basename: DDIMER:2 in the last 72  hours CBG: No results found for this basename: GLUCAP:6 in the last 72 hours Hemoglobin A1C: No results found for this basename: HGBA1C in the last 72 hours Fasting Lipid Panel: No results found for this basename: CHOL,HDL,LDLCALC,TRIG,CHOLHDL,LDLDIRECT in the last 72 hours Thyroid Function Tests: No results found for this basename: TSH,T4TOTAL,FREET4,T3FREE,THYROIDAB in the last 72 hours Anemia Panel:  Basename 07/06/11 0540  VITAMINB12 453  FOLATE --  FERRITIN --  TIBC --  IRON --  RETICCTPCT --   Coagulation: No results found for this basename: LABPROT:2,INR:2 in the last 72 hours Urine Drug Screen:  Alcohol Level: No results found for this basename: ETH:2 in the last 72 hours Urinalysis:  Misc. Labs:  No results found for this or any previous visit (from the past 240 hour(s)).  Studies/Results: Mr Lumbar Spine Wo Contrast  07/06/2011  *RADIOLOGY REPORT*  Clinical Data:  Leg weakness.  Rule out spinal stenosis.  MRI LUMBAR SPINE WITHOUT CONTRAST  Technique:  Multiplanar and multiecho pulse sequences of the lumbar spine were obtained without intravenous contrast.  Comparison:  Lumbar spine radiographs 07/04/2011.  Findings:  Normal signal is present in the conus medullaris which terminates at T12-L1.  Leftward curvature of the lumbar spine is centered at L3-4.  Superior endplate fractures at L3 and L4 are remote.  No  acute or healing fractures are present.  Minimal endplate edematous changes are noted along the superior endplate of L5.  Slight anterolisthesis is present at L2-3.  Degenerative retrolisthesis at L4-5 measures 3 mm.  Limited imaging of the abdomen is unremarkable.  The disc levels are as follows.  T12-L1:  A far left lateral disc bulge is present.  There is no significant stenosis.  L1-2:  Leftward disc bulging is present.  The disc bulges into the inferior recess of the left neural foramen without significant stenosis.  L2-3:  Broad-based disc bulging is present.   There is uncovering of the disc associated with the anterolisthesis.  This results in mild right lateral recess narrowing.  The foramina are patent bilaterally.  L3-4:  Broad-based disc bulging is more prominent on the right. Facet hypertrophy is noted as well.  This results in moderate right lateral recess and foraminal stenosis.  The left foramen is patent.  L4-5:  Broad-based disc bulging is present.  Lateral recess narrowing is worse right than left.  Moderate to severe left and moderate right foraminal stenosis is present.  L5-S1:  Broad-based disc bulging is present.  Mild lateral recess narrowing is present bilaterally.  The foramina are patent. Moderate facet hypertrophy is noted.  IMPRESSION:  1.  Leftward curvature of lumbar spine is centered at L3-4. 2.  Superior endplate fractures at L3 and L4 are remote. 3.  Mild right lateral recess narrowing at L2-3. 4.  Moderate right lateral recess and foraminal stenosis at L3-4. 5.  Moderate to severe left and moderate right foraminal stenosis at L4-5. 6.  Lateral recess narrowing at L4-5 is worse right than left.  7.  Mild lateral recess narrowing bilaterally at L5-S1.  *RADIOLOGY REPORT*  MRI SACRUM WITHOUT CONTRAST  Technique:  Multiplanar and multiecho pulse sequences of the sacrum to include the sacroiliac joints and the coccyx were obtained without intravenous contrast.  Findings:  There is focal T1 signal loss and edema within the last sacral segment or proximal coccyx.  There is no displacement.  This likely represents a nondisplaced fracture.  There is no significant soft tissue injury surrounding.  The hips are within normal limits.  The sacral foramina are patent bilaterally.  The SI joints are unremarkable.  IMPRESSION:  1.  Nondisplaced fracture of the distal sacrum and proximal coccyx.  Original Report Authenticated By: Jamesetta Orleans. MATTERN, M.D.   Mr Sacrum/si Joints Wo Contrast  07/06/2011  *RADIOLOGY REPORT*  Clinical Data:  Leg weakness.   Rule out spinal stenosis.  MRI LUMBAR SPINE WITHOUT CONTRAST  Technique:  Multiplanar and multiecho pulse sequences of the lumbar spine were obtained without intravenous contrast.  Comparison:  Lumbar spine radiographs 07/04/2011.  Findings:  Normal signal is present in the conus medullaris which terminates at T12-L1.  Leftward curvature of the lumbar spine is centered at L3-4.  Superior endplate fractures at L3 and L4 are remote.  No acute or healing fractures are present.  Minimal endplate edematous changes are noted along the superior endplate of L5.  Slight anterolisthesis is present at L2-3.  Degenerative retrolisthesis at L4-5 measures 3 mm.  Limited imaging of the abdomen is unremarkable.  The disc levels are as follows.  T12-L1:  A far left lateral disc bulge is present.  There is no significant stenosis.  L1-2:  Leftward disc bulging is present.  The disc bulges into the inferior recess of the left neural foramen without significant stenosis.  L2-3:  Broad-based disc bulging is present.  There is uncovering of the disc associated with the anterolisthesis.  This results in mild right lateral recess narrowing.  The foramina are patent bilaterally.  L3-4:  Broad-based disc bulging is more prominent on the right. Facet hypertrophy is noted as well.  This results in moderate right lateral recess and foraminal stenosis.  The left foramen is patent.  L4-5:  Broad-based disc bulging is present.  Lateral recess narrowing is worse right than left.  Moderate to severe left and moderate right foraminal stenosis is present.  L5-S1:  Broad-based disc bulging is present.  Mild lateral recess narrowing is present bilaterally.  The foramina are patent. Moderate facet hypertrophy is noted.  IMPRESSION:  1.  Leftward curvature of lumbar spine is centered at L3-4. 2.  Superior endplate fractures at L3 and L4 are remote. 3.  Mild right lateral recess narrowing at L2-3. 4.  Moderate right lateral recess and foraminal stenosis at  L3-4. 5.  Moderate to severe left and moderate right foraminal stenosis at L4-5. 6.  Lateral recess narrowing at L4-5 is worse right than left.  7.  Mild lateral recess narrowing bilaterally at L5-S1.  *RADIOLOGY REPORT*  MRI SACRUM WITHOUT CONTRAST  Technique:  Multiplanar and multiecho pulse sequences of the sacrum to include the sacroiliac joints and the coccyx were obtained without intravenous contrast.  Findings:  There is focal T1 signal loss and edema within the last sacral segment or proximal coccyx.  There is no displacement.  This likely represents a nondisplaced fracture.  There is no significant soft tissue injury surrounding.  The hips are within normal limits.  The sacral foramina are patent bilaterally.  The SI joints are unremarkable.  IMPRESSION:  1.  Nondisplaced fracture of the distal sacrum and proximal coccyx.  Original Report Authenticated By: Jamesetta Orleans. MATTERN, M.D.    Medications: Scheduled Meds:   . amLODipine  5 mg Oral Daily  . ciprofloxacin  500 mg Oral BID  . famotidine  20 mg Oral Daily  . olmesartan  40 mg Oral Daily   Continuous Infusions:  PRN Meds:.spiritus frumenti  Assessment/Plan:  Principal Problem: 1 *Sacral insufficiency fracture: appreciate Ortho assistance. Will continue PT. Will start oscal/d for mangment of osteoporosis. Will probably need short term plcmenr Active Problems: 2. Syncope: No further s/sx. Likely UTI/peipheral neuropathy 3. Leukocytosis: WC creeping up. Afebrile. Non-toxic appearing. Will send urine for culture. Will start Incentive spirometry. Most likely reactive. Will monitor closely. On cipro day #4 for UTI 4. UTI. Cipro day #4. Sending culture.  5. HTN (hypertension): poor control will add low dose BB monitor 6. Constipation: will add stool softener. Had very sm BM 11/12. 7. A-fib: rate controlled 8. Mild hyponatremia. Stable monitor   LOS: 4 days   [facility] M 07/08/2011, 1:26 PM  Saw and examined patient at  bedside. Discussed plan of care with Clydie Braun.

## 2011-07-08 NOTE — Progress Notes (Signed)
Physical Therapy Treatment Patient Details Name: Shirley Strickland MRN: [id number] DOB: 06-20-1916 Today's Date: 07/08/2011 Time: 5621-3086  1G, 1TE PT Assessment/Plan  PT - Assessment/Plan Comments on Treatment Session: Able to initiate ambulation this session. Pt slowly progressing. Pt questioning possibility for CIR for therapy. Explained to pt and family that CIR will likely be too intensive for pt and that ST Rehab at SNF will be more appropriate.  PT Plan: Discharge plan remains appropriate Follow Up Recommendations: Skilled nursing facility PT Goals  Acute Rehab PT Goals PT Goal: Supine/Side to Sit - Progress: Progressing toward goal PT Transfer Goal: Sit to Stand/Stand to Sit - Progress: Progressing toward goal PT Goal: Ambulate - Progress: Progressing toward goal  PT Treatment Precautions/Restrictions  Precautions Precautions: Fall Precaution Comments: Bilateral lower legs extremely tender to touch Restrictions Weight Bearing Restrictions: No Mobility (including Balance) Bed Mobility Bed Mobility: Yes Supine to Sit: HOB elevated (Comment degrees);3: Mod assist Supine to Sit Details (indicate cue type and reason): Assist for trunk to upright and scoot to EOB - utilized bed pad. Increased time.  Sitting - Scoot to Edge of Bed: 3: Mod assist Transfers Transfers: Yes Sit to Stand: 1: +2 Total assist;From bed;From elevated surface;With upper extremity assist;Other (comment) (Pt=60%) Sit to Stand Details (indicate cue type and reason): VCs safety, technique. Assist to rise, stabilize. Increased time. Stand to Sit: 1: +2 Total assist;To chair/3-in-1;With upper extremity assist;Other (comment) (Pt=60%) Stand to Sit Details: VCs safety, technique, hand placement. Assist to control descent.  Ambulation/Gait Ambulation/Gait: Yes Ambulation/Gait Assistance: 1: +2 Total assist;Other (comment) (Pt=60%) Ambulation/Gait Assistance Details (indicate cue type and reason): VCs safety,  technique, sequence. Assist to advance RW, maintain stability. Increased time and effort for pt to ambulate. Fatigues easily. Ambulation Distance (Feet): 12 Feet Assistive device: Rolling walker Gait Pattern: Decreased stride length;Decreased step length - right;Decreased step length - left;Decreased weight shift to left;Decreased weight shift to right;Antalgic;Step-to pattern    Exercise  Total Joint Exercises Ankle Circles/Pumps: AROM;Both;10 reps;Other (comment);Seated (Improved mobility and range of left ankle.) Quad Sets: AROM;Both;10 reps;Seated Hip ABduction/ADduction: AAROM;Both;10 reps;Seated End of Session PT - End of Session Equipment Utilized During Treatment: Gait belt Activity Tolerance: Patient limited by pain;Patient limited by fatigue Patient left: in chair;with call bell in reach;with family/visitor present Nurse Communication: Mobility status for transfers General Behavior During Session: [facility] for tasks performed Cognition: [facility] for tasks performed  [name] Alert [facility] 07/08/2011, 12:17 PM

## 2011-07-08 NOTE — Progress Notes (Signed)
Occupational Therapy Evaluation Patient Details Name: Shirley Strickland MRN: [id number] DOB: 1915-09-14 Today's Date: 07/08/2011  Problem List:  Patient Active Problem List  Diagnoses  . Syncope  . Fall  . HTN (hypertension)  . A-fib  . Sacral insufficiency fracture    Past Medical History:  Past Medical History  Diagnosis Date  . 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 Cancer     pt states only skin cancer  . Numbness of legs     started after fall yesterday 07/03/11  . Bruise     bruise on left foot from fall 07/03/11  . Knee pain, acute 07/04/11    Pt states left knee twisted under her in fall 07/03/11 and now painful  . Rash and nonspecific skin eruption 07/04/11    rash on bilateral upper legs  . Syncope   . Fall   . HTN (hypertension)   . A-fib    Past Surgical History:  Past Surgical History  Procedure Date  . Elbow surgery     right  . Appendectomy   . Exploratory laparotomy 07/04/11    years ago to find cause of bleeding    OT Assessment/Plan/Recommendation OT Assessment Clinical Impression Statement: This 77 y.o. female presents to OT s/p fall resulting in pelvic fracture.  Pt. demonstrates generalized weakness, decreased balance, decreased independence with BADLs.  Recommend OT to allow pt to return to modified independent level with BADLs after SNF level rehab.  Pt. very motivated to regain independence.    OT Recommendation/Assessment: Patient will need skilled OT in the acute care venue OT Problem List: Decreased strength;Impaired balance (sitting and/or standing);Decreased knowledge of use of DME or AE Barriers to Discharge: Decreased caregiver support (Pt. for SNF) OT Therapy Diagnosis : Generalized weakness OT Plan OT Frequency: Min 1X/week OT Treatment/Interventions: Self-care/ADL training;DME and/or AE instruction;Therapeutic activities;Patient/family education;Balance training OT Recommendation Recommendations for Other Services:  (PT following  pt) Follow Up Recommendations: Skilled nursing facility Equipment Recommended: Defer to next venue Individuals Consulted Consulted and Agree with Results and Recommendations: Patient OT Goals Acute Rehab OT Goals OT Goal Formulation: With patient Time For Goal Achievement: 7 days ADL Goals Pt Will Perform Grooming: Standing at sink (with min guard A) Pt Will Perform Lower Body Bathing: Sit to stand from chair;Sit to stand from bed (min guard A) Pt Will Perform Lower Body Dressing: Sit to stand from chair;Sit to stand from bed;Other (comment) (min guard A) Pt Will Transfer to Toilet: with min assist;Ambulation;Comfort height toilet Pt Will Perform Toileting - Clothing Manipulation: Standing;Other (comment) (min guard A)  OT Evaluation Precautions/Restrictions  Precautions Precautions: Fall Precaution Comments: Bilateral lower legs extremely tender to touch Restrictions Weight Bearing Restrictions: No Prior Functioning Home Living Lives With: Alone (Pt. for SNF placement for rehab) Prior Function Level of Independence: Independent with basic ADLs;Needs assistance with homemaking Meal Prep: Moderate Vacuuming: Minimal Yard Work: Total ADL ADL Eating/Feeding: Simulated;Independent Where Assessed - Eating/Feeding: Chair Grooming: Simulated;Wash/dry hands;Wash/dry face;Teeth care;Brushing hair;Set up Where Assessed - Grooming: Sitting, chair Upper Body Bathing: Simulated;Set up Where Assessed - Upper Body Bathing: Unsupported;Sitting, chair Lower Body Bathing: Simulated;Moderate assistance Where Assessed - Lower Body Bathing: Sit to stand from chair Upper Body Dressing: Simulated;Minimal assistance Where Assessed - Upper Body Dressing: Unsupported;Sitting, chair Lower Body Dressing: Simulated;Moderate assistance Lower Body Dressing Details (indicate cue type and reason): Pt. bends forward to don socks with min A Where Assessed - Lower Body Dressing: Sit to stand from  chair Toilet Transfer:  Simulated;Minimal assistance (Mod cues) Toilet Transfer Method: Stand pivot Toilet Transfer Equipment: Bedside commode Toileting - Clothing Manipulation: Moderate assistance Where Assessed - Toileting Clothing Manipulation: Standing Toileting - Hygiene: Simulated;Moderate assistance Where Assessed - Toileting Hygiene: Standing Tub/Shower Transfer: Not assessed Equipment Used: Rolling walker ADL Comments: Pt. is very anxious.  Pt. requires mod cues and encouragement for all acitivities.  Pt. frequently states that she is unable to perform what is asked; however, when encouragement provided, pt. performed all requested activities.  During transfers, pt. requires min facilitation to anterior translation of weight in standing.  Also requires cues to properly advance walker, and to weight shift.  When pt. not thinking about what she is doing, movement is controlled and fluid.   Vision/Perception  Vision - History Baseline Vision: Wears glasses all the time Vision - Assessment Vision Assessment: Vision not tested Cognition Cognition Arousal/Alertness: Awake/alert Overall Cognitive Status: Appears within functional limits for tasks assessed Cognition - Other Comments: Pt. is very anxious resulting in decreased problem solving Sensation/Coordination Sensation Light Touch: Appears Intact (UEs) Coordination Gross Motor Movements are Fluid and Coordinated: Yes (pt. is tremulous at times - reports anxiety) Fine Motor Movements are Fluid and Coordinated: Yes (Pt. is tremulous - reports anxiety) Extremity Assessment RUE Assessment RUE Assessment: Within Functional Limits LUE Assessment LUE Assessment: Within Functional Limits Mobility  Bed Mobility Bed Mobility: Yes Sit to Supine - Right: 4: Min assist Transfers Transfers: Yes Sit to Stand: 4: Min assist (mod cues for anterior weight shift.) Sit to Stand Details (indicate cue type and reason): Pt. requires significant  encouragement.  Becomes fearful stating she is going to fall, although no LOB.   Stand to Sit: 4: Min assist Exercises Total Joint Exercises Ankle Circles/Pumps: AROM;Both;10 reps;Other (comment);Seated (Improved mobility and range of left ankle.) Quad Sets: AROM;Both;10 reps;Seated Hip ABduction/ADduction: AAROM;Both;10 reps;Seated End of Session OT - End of Session Equipment Utilized During Treatment:  (RW) Activity Tolerance: Patient tolerated treatment well (However, pt. very anxious) Patient left: in bed;with call bell in reach Nurse Communication: Mobility status for transfers General Behavior During Session: Restless Cognition:  (Anxious)   Shawnell Dykes, Ursula Alert M 07/08/2011, 3:05 PM

## 2011-07-09 LAB — CBC
HCT: 26.7 % — ABNORMAL LOW (ref 36.0–46.0)
MCH: 28.4 pg (ref 26.0–34.0)
MCV: 85.3 fL (ref 78.0–100.0)
RDW: 13.2 % (ref 11.5–15.5)
WBC: 12.4 10*3/uL — ABNORMAL HIGH (ref 4.0–10.5)

## 2011-07-09 LAB — BASIC METABOLIC PANEL
BUN: 11 mg/dL (ref 6–23)
CO2: 25 mEq/L (ref 19–32)
Chloride: 95 mEq/L — ABNORMAL LOW (ref 96–112)
Creatinine, Ser: 0.77 mg/dL (ref 0.50–1.10)
GFR calc Af Amer: 81 mL/min — ABNORMAL LOW (ref >90)

## 2011-07-09 MED ORDER — PHENAZOPYRIDINE HCL 200 MG PO TABS
200.0000 mg | ORAL_TABLET | Freq: Three times a day (TID) | ORAL | Status: DC
  Administered 2011-07-09 – 2011-07-10 (×5): 200 mg via ORAL
  Filled 2011-07-09 (×8): qty 1

## 2011-07-09 MED ORDER — AMLODIPINE BESYLATE 10 MG PO TABS
10.0000 mg | ORAL_TABLET | Freq: Every day | ORAL | Status: DC
  Administered 2011-07-10: 10 mg via ORAL
  Filled 2011-07-09 (×2): qty 1

## 2011-07-09 NOTE — Progress Notes (Signed)
Subjective: "I think i will be able to get back home if i have therapy everyday" No complaints  Objective: Vital signs Filed Vitals:   07/08/11 0438 07/08/11 1300 07/08/11 2147 07/09/11 0625  BP: 171/74 152/69 165/68 174/70  Pulse: 81 99 80 78  Temp: 98.9 F (37.2 C) 98.6 F (37 C) 98.6 F (37 C) 98.9 F (37.2 C)  TempSrc: Oral Oral Oral   Resp: 16 20 17 18   Height:      Weight:      SpO2: 91% 98% 93% 95%   Weight change:  Last BM Date: 07/08/11  Intake/Output from previous day: 11/13 0701 - 11/14 0700 In: 700 [P.O.:700] Out: 1301 [Urine:1300; Stool:1]     Physical Exam: General: Alert, awake, oriented x3, in no acute distress. HEENT: No bruits, no goiter.PERRL, EOMI, MM of mouth moist and pink Heart: Regular rate and rhythm, without murmurs, rubs, gallops. Lungs: Clear to auscultation bilaterally. No wheeze, rhonchi Abdomen: Soft, nontender, nondistended, positive bowel sounds. Extremities: No clubbing cyanosis or edema with positive pedal pulses. Left foot with bruising. Bilateral shins with bruising. Slight tenderness to touch.  Neuro: Grossly intact, nonfocal. Speech clear. Facial symmetry    Lab Results: Basic Metabolic Panel:  Basename 07/09/11 0515 07/08/11 0505  NA 128* 129*  K 4.5 4.1  CL 95* 97  CO2 25 25  GLUCOSE 111* 129*  BUN 11 9  CREATININE 0.77 0.77  CALCIUM 9.9 8.4  MG -- --  PHOS -- --   Liver Function Tests:  Basename 07/07/11 0453  AST 21  ALT 20  ALKPHOS 62  BILITOT 0.4  PROT 5.3*  ALBUMIN 2.5*   No results found for this basename: LIPASE:2,AMYLASE:2 in the last 72 hours No results found for this basename: AMMONIA:2 in the last 72 hours CBC:  Basename 07/09/11 0515 07/08/11 0505  WBC 12.4* 13.0*  NEUTROABS -- --  HGB 8.9* 8.8*  HCT 26.7* 26.4*  MCV 85.3 85.2  PLT 218 207   Cardiac Enzymes: No results found for this basename: CKTOTAL:3,CKMB:3,CKMBINDEX:3,TROPONINI:3 in the last 72 hours BNP: No results found for this  basename: POCBNP:3 in the last 72 hours D-Dimer: No results found for this basename: DDIMER:2 in the last 72 hours CBG: No results found for this basename: GLUCAP:6 in the last 72 hours Hemoglobin A1C: No results found for this basename: HGBA1C in the last 72 hours Fasting Lipid Panel: No results found for this basename: CHOL,HDL,LDLCALC,TRIG,CHOLHDL,LDLDIRECT in the last 72 hours Thyroid Function Tests: No results found for this basename: TSH,T4TOTAL,FREET4,T3FREE,THYROIDAB in the last 72 hours Anemia Panel: No results found for this basename: VITAMINB12,FOLATE,FERRITIN,TIBC,IRON,RETICCTPCT in the last 72 hours Coagulation: No results found for this basename: LABPROT:2,INR:2 in the last 72 hours Urine Drug Screen:  Alcohol Level: No results found for this basename: ETH:2 in the last 72 hours Urinalysis:  Misc. Labs:  No results found for this or any previous visit (from the past 240 hour(s)).  Studies/Results: No results found.  Medications: Scheduled Meds:   . amLODipine  5 mg Oral Daily  . calcium-vitamin D  1 tablet Oral BID  . ciprofloxacin  500 mg Oral BID  . famotidine  20 mg Oral Daily  . metoprolol tartrate  12.5 mg Oral BID  . olmesartan  40 mg Oral Daily  . polyethylene glycol  17 g Oral Daily   Continuous Infusions:  PRN Meds:.spiritus frumenti  Assessment/Plan:  Principal Problem: 1. *Sacral insufficiency fracture: Continue PT. SW consult for plcmt. F/U with Ortho 2-3  weeks. Mobility improving with PT Active Problems:  2.Syncope/Fall: W/U neg. Likely due to UTI/periph neuropathy 3. HTN (hypertension): somewhat improved control. Continue BB and norvasc 4. A-fib: rate controlled.  5. UTI. Cipro #5. Culture pending 6. Constipation: Sm BM today 7. Mild hyponatremia. Chart review indicates somewhat chronic. Range 128-130. Stable. 8. Dispo: to short term rehab when bed avail.   LOS: 5 days   [facility] M 07/09/2011, 8:20 AM

## 2011-07-09 NOTE — Progress Notes (Signed)
Agree with Toya Smothers NP assessment and plan,  Increase norvasc as BP has been elevayed

## 2011-07-09 NOTE — Clinical Documentation Improvement (Signed)
Anemia Blood Loss Clarification  Dear Kathalene Frames Marton Redwood  In an effort to better capture your patient's severity of illness, reflect appropriate length of stay and utilization of resources, a review of the patient medical record has revealed the following indicators.     Pt admitted with syncope.   According to pn pt ? hemo dilutional on chronic anemia, please clarify whether or not ? hemo dilultional on chronic anemia can be further specified as one of the diagnoses listed below and document in pn or d/c summary. Thank you!   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 hemo dilutional on chronic anemia? Anemia panel/stool hemoccult.  Diagnostics  Diagnostic:                    07/04/11 1727  07/04/11 0950   WBC                  11.1*                  12.7*   HGB                   9.9*                   11.6*   HCT                  29.4*                   34.4*      Treatment:  Monitoring H/H   Based on your clinical judgment, please clarify and document in a progress note and/or discharge summary the clinical condition associated with the following supporting information:  In responding to this query please exercise your independent judgment.  The fact that a query is asked, does not imply that any particular answer is desired or expected.  Reviewed: additional documentation in the medical record   Thank You,  Sincerely, Enis Slipper  Clinical Documentation Specialist:  Pager: [phone]  [facility] Information [facility] [place]

## 2011-07-10 DIAGNOSIS — R350 Frequency of micturition: Secondary | ICD-10-CM | POA: Clinically undetermined

## 2011-07-10 LAB — RETICULOCYTES: Retic Ct Pct: 1.5 % (ref 0.4–3.1)

## 2011-07-10 LAB — URINALYSIS, ROUTINE W REFLEX MICROSCOPIC
Nitrite: POSITIVE — AB
Protein, ur: NEGATIVE mg/dL
Urobilinogen, UA: 2 mg/dL — ABNORMAL HIGH (ref 0.0–1.0)

## 2011-07-10 LAB — BASIC METABOLIC PANEL
Calcium: 9.6 mg/dL (ref 8.4–10.5)
GFR calc non Af Amer: 49 mL/min — ABNORMAL LOW (ref >90)
Sodium: 131 mEq/L — ABNORMAL LOW (ref 135–145)

## 2011-07-10 LAB — IRON AND TIBC
Saturation Ratios: 17 % — ABNORMAL LOW (ref 20–55)
TIBC: 272 ug/dL (ref 250–470)

## 2011-07-10 LAB — URINE MICROSCOPIC-ADD ON

## 2011-07-10 LAB — FERRITIN: Ferritin: 129 ng/mL (ref 10–291)

## 2011-07-10 LAB — VITAMIN B12: Vitamin B-12: 323 pg/mL (ref 211–911)

## 2011-07-10 MED ORDER — CALCIUM CARBONATE-VITAMIN D 500-200 MG-UNIT PO TABS: 1.0000 | ORAL_TABLET | Freq: Two times a day (BID) | ORAL | Status: DC

## 2011-07-10 MED ORDER — SPIRITUS FRUMENTI: 1.0000 | Freq: Every day | ORAL | Status: DC | PRN

## 2011-07-10 MED ORDER — CIPROFLOXACIN HCL 500 MG PO TABS: 500.0000 mg | ORAL_TABLET | Freq: Two times a day (BID) | ORAL | Status: AC

## 2011-07-10 MED ORDER — AMLODIPINE BESYLATE 10 MG PO TABS: 10.0000 mg | ORAL_TABLET | Freq: Every day | ORAL | Status: DC

## 2011-07-10 MED ORDER — METOPROLOL TARTRATE 12.5 MG HALF TABLET: 12.5000 mg | ORAL_TABLET | Freq: Two times a day (BID) | ORAL | Status: DC

## 2011-07-10 MED ORDER — PHENAZOPYRIDINE HCL 200 MG PO TABS: 200.0000 mg | ORAL_TABLET | Freq: Three times a day (TID) | ORAL | Status: AC

## 2011-07-10 NOTE — Discharge Summary (Signed)
Addendum to Shirley Strickland nurse practitioners discharge summary #1 patient could benefit with the another antihypertensive medication such as hydralazine, she is allergic to Diovan, with some cross-reactivity with ACE inhibitors. Therefore we'll avoid hydrochlorothiazide ACE inhibitors and ARB use. For the time being continue with metoprolol and Norvasc.  #2 she was complaining of some urinary frequency yesterday, started on pyridium, will conitnue this for 3 more days, she should continue with ciprofloxacin for another 2 days.  #3 hyponatremia, continues to improve  #4 anemia, would ensure that the patient received CBC, in the outpatient setting, patient currently not on any Lovenox or heparin for DVT prophylaxis because of her anemia. She should continue with TED stockings at the nursing home, and we should encourage ambulation as tolerated.

## 2011-07-10 NOTE — Progress Notes (Signed)
Physical Therapy Treatment Patient Details Name: Shirley Strickland MRN: [id number] DOB: Dec 31, 1915 Today's Date: 07/10/2011 Time: 0940-1008  1G, 1TE PT Assessment/Plan  PT - Assessment/Plan Comments on Treatment Session: Pt progressing slowly, but fairly well. Able to increase activity with each session. Continue to recommend SNF for rehab. Encouraged pt to perform LE exercises at least once more today.  PT Plan: Discharge plan remains appropriate Follow Up Recommendations: Skilled nursing facility PT Goals  Acute Rehab PT Goals PT Transfer Goal: Sit to Stand/Stand to Sit - Progress: Progressing toward goal PT Goal: Ambulate - Progress: Progressing toward goal  PT Treatment Precautions/Restrictions  Precautions Precautions: Fall Precaution Comments: Bilateral lower legs extremely tender to touch Restrictions Weight Bearing Restrictions: No Mobility (including Balance) Transfers: Yes Sit to Stand: 4: Min assist;From chair/3-in-1;With upper extremity assist;With armrests Sit to Stand Details : VCs safety, technique, hand placement. Increased time. Assist to rise, stabilize.  Stand to Sit: 4: Min assist;To chair/3-in-1;With upper extremity assist;With armrests Stand to Sit Details: VCs safety, technique, hand placement. Assist to control descent. Ambulation/Gait Ambulation/Gait: Yes Ambulation/Gait Assistance: 4: Min assist Ambulation/Gait Assistance Details : VCs safety, technique, posture. Increased time: > or = 10 minutes to walk 40 feet. Followed with recliner due to fatigue, decreased activity tolerance. Ambulation Distance (Feet): 40 Feet Assistive device: Rolling walker Gait Pattern: Decreased weight shift to right;Decreased weight shift to left;Decreased step length - left;Decreased step length - right;Decreased stride length;Trunk flexed    Exercise  General Exercises - Lower Extremity Ankle Circles/Pumps: AROM;Both;15 reps;Seated (Pt reports pain in ankles with ROM) Long Arc  Quad: AROM;Strengthening;Both;15 reps;Seated Hip Flexion/Marching: Seated;Both;Other reps (comment);AROM;Strengthening (15 reps each leg) End of Session PT - End of Session Equipment Utilized During Treatment: Gait belt Activity Tolerance: Patient limited by fatigue;Patient limited by pain Patient left: in chair;with call bell in reach;with family/visitor present General Behavior During Session: [name] for tasks performed Cognition: [name] for tasks performed  Shirley Strickland Alert [name] 07/10/2011, 10:19 AM

## 2011-07-10 NOTE — Discharge Summary (Addendum)
Physician Discharge Summary  Patient ID: FLOIS MCTAGUE MRN: [id number] DOB/AGE: 75-Jul-1917 75 y.o.  Admit date: 07/04/2011 Discharge date: 07/10/2011  Primary Care Physician:  No primary provider on file.   Discharge Diagnoses:    Present on Admission:  .Syncope .Fall .A-fib .HTN (hypertension) .Sacral insufficiency fracture  Current Discharge Medication List    START taking these medications   Details  amLODipine (NORVASC) 10 MG tablet Take 1 tablet (10 mg total) by mouth daily. Qty: 30 tablet, Refills: 0    calcium-vitamin D (OSCAL WITH D) 500-200 MG-UNIT per tablet Take 1 tablet by mouth 2 (two) times daily. Qty: 30 tablet, Refills: 0    ciprofloxacin (CIPRO) 500 MG tablet Take 1 tablet (500 mg total) by mouth 2 (two) times daily. Qty: 4 tablet, Refills: 0    metoprolol tartrate (LOPRESSOR) 12.5 mg TABS Take 0.5 tablets (12.5 mg total) by mouth 2 (two) times daily. Qty: 30 tablet, Refills: 0    phenazopyridine (PYRIDIUM) 200 MG tablet Take 1 tablet (200 mg total) by mouth 3 (three) times daily with meals. Qty: 10 tablet, Refills: 0    spiritus frumenti (ETHYL ALCOHOL) SOLN Take 1 each by mouth daily as needed (withdrawal). Qty: 30 each, Refills: 0      CONTINUE these medications which have NOT CHANGED   Details  calcium carbonate (OS-CAL - DOSED IN MG OF ELEMENTAL CALCIUM) 1250 MG tablet Take 1 tablet by mouth daily.      cholecalciferol (VITAMIN D) 1000 UNITS tablet Take 2,000 Units by mouth daily.      telmisartan (MICARDIS) 80 MG tablet Take 80 mg by mouth daily.      predniSONE (DELTASONE) 20 MG tablet Take 20 mg by mouth daily.        STOP taking these medications     aliskiren (TEKTURNA) 300 MG tablet          Disposition and Follow-up:  Pt medically stable and ready for discharge to facility  Consults:  Dr. Dion Saucier, Orthopedic surg   Significant Diagnostic Studies:  Dg Chest 2 View  07/04/2011  *RADIOLOGY REPORT*  Clinical Data:  Hypertension, fall, pain  CHEST - 2 VIEW  Comparison: None  Findings: Upper normal heart size. Elongation aorta. Slight pulmonary vascular congestion. Bronchitic changes without infiltrate or effusion. No pneumothorax. Bones diffusely demineralized.  IMPRESSION: Bronchitic changes.  Original Report Authenticated By: Lollie Marrow, M.D.   Dg Lumbar Spine Complete  07/04/2011  *RADIOLOGY REPORT*  Clinical Data: Low back and bilateral leg pain, no known injury  LUMBAR SPINE - COMPLETE 4+ VIEW  Comparison: None  Findings: Osseous demineralization. Five non-rib bearing lumbar vertebrae. Levoconvex lumbar scoliosis apex L3. Multilevel disc space narrowing and endplate spur formation. Facet degenerative changes mid to lower lumbar spine. Mild superior end plate height loss at L4 and questionably L3. These are age indeterminate. No additional fracture, subluxation or bone destruction. No spondylolysis. Scattered atherosclerotic calcifications aorta.  IMPRESSION: Osseous demineralization. Multilevel degenerative disc and facet disease changes of the lumbar spine with associated levoconvex scoliosis. Age indeterminate superior endplate compression deformity of L4, questionably L3.  Per CMS PQRS reporting requirements (PQRS Measure 24): Given the patient's age of greater than 50 and the fracture site (hip, distal radius, or spine), the patient should be tested for osteoporosis using DXA, and the appropriate treatment considered based on the DXA results.  Original Report Authenticated By: Lollie Marrow, M.D.   Dg Hip Bilateral W/pelvis  07/04/2011  *RADIOLOGY REPORT*  Clinical Data: Bilateral  hip pain and stiffness secondary to a fall today.  BILATERAL HIP WITH PELVIS - 4+ VIEW  Comparison: None.  Findings: No fracture, dislocation, arthritis, or other significant abnormality of the hips or pelvis.  IMPRESSION: Normal exam.  Original Report Authenticated By: Gwynn Burly, M.D.   Dg Ankle 2 Views Left  07/04/2011   *RADIOLOGY REPORT*  Clinical Data: Fall, left ankle pain/bruising  LEFT ANKLE - 2 VIEW  Comparison: None.  Findings: No definite fracture is seen.  Mild irregularity of the medial malleolus.  The visualized soft tissues are grossly unremarkable.  IMPRESSION: No definite fracture is seen.  Mild irregularity of the medial malleolus.  Correlate with the site of the patient's pain.  Original Report Authenticated By: Charline Bills, M.D.   Ct Head Wo Contrast  07/04/2011  *RADIOLOGY REPORT*  Clinical Data: The patient fell.  CT HEAD WITHOUT CONTRAST  Technique:  Contiguous axial images were obtained from the base of the skull through the vertex without contrast.  Comparison: None.  Findings: There is no acute intracranial hemorrhage, infarction, or mass lesion.  Mild cerebral cortical atrophy.  Prominent venous lakes in the occipital bone, not felt to be significant.  IMPRESSION: No acute intracranial abnormality.  Mild atrophy.  Original Report Authenticated By: Gwynn Burly, M.D.   Dg Foot 2 Views Left  07/04/2011  *RADIOLOGY REPORT*  Clinical Data: Fall, left foot pain, bruising on the third through fifth toes and ankle  LEFT FOOT - 2 VIEW  Comparison: None.  Findings: No evidence of acute fracture or dislocation.  Deformity of the third, fourth, and fifth metatarsals, suggesting prior fracture.  The visualized soft tissues are unremarkable.  IMPRESSION: No evidence of acute fracture or dislocation.  Old fractures of the left third through fifth metatarsals.  Original Report Authenticated By: Charline Bills, M.D.    Labs Reviewed  CBC - Abnormal; Notable for the following:    WBC 12.7 (*)    Hemoglobin 11.6 (*)    HCT 34.4 (*)    All other components within normal limits  DIFFERENTIAL - Abnormal; Notable for the following:    Neutrophils Relative 81 (*)    Neutro Abs 10.3 (*)    Lymphocytes Relative 8 (*)    Monocytes Absolute 1.4 (*)    All other components within normal limits    COMPREHENSIVE METABOLIC PANEL - Abnormal; Notable for the following:    Sodium 128 (*)    Chloride 93 (*)    Glucose, Bld 141 (*)    Calcium 10.7 (*)    GFR calc non Af Amer 61 (*)    GFR calc Af Amer 70 (*)    All other components within normal limits  URINALYSIS, ROUTINE W REFLEX MICROSCOPIC - Abnormal; Notable for the following:    Appearance CLOUDY (*)    Nitrite POSITIVE (*)    Leukocytes, UA TRACE (*)    All other components within normal limits  CARDIAC PANEL(CRET KIN+CKTOT+MB+TROPI) - Abnormal; Notable for the following:    CK, MB 4.4 (*)    Relative Index 2.9 (*)    All other components within normal limits  URINE MICROSCOPIC-ADD ON - Abnormal; Notable for the following:    Bacteria, UA MANY (*)    All other components within normal limits  CBC - Abnormal; Notable for the following:    WBC 11.1 (*)    RBC 3.45 (*)    Hemoglobin 9.9 (*)    HCT 29.4 (*)    All other  components within normal limits  CREATININE, SERUM - Abnormal; Notable for the following:    GFR calc non Af Amer 71 (*)    GFR calc Af Amer 83 (*)    All other components within normal limits  URINALYSIS, ROUTINE W REFLEX MICROSCOPIC - Abnormal; Notable for the following:    Appearance CLOUDY (*)    Hgb urine dipstick TRACE (*)    Nitrite POSITIVE (*)    Leukocytes, UA TRACE (*)    All other components within normal limits  COMPREHENSIVE METABOLIC PANEL - Abnormal; Notable for the following:    Sodium 131 (*)    Glucose, Bld 118 (*)    Total Protein 5.1 (*)    Albumin 2.8 (*)    GFR calc non Af Amer 48 (*)    GFR calc Af Amer 56 (*)    All other components within normal limits  PHOSPHORUS - Abnormal; Notable for the following:    Phosphorus 2.0 (*)    All other components within normal limits  URINE MICROSCOPIC-ADD ON - Abnormal; Notable for the following:    Bacteria, UA MANY (*)    All other components within normal limits  CARDIAC PANEL(CRET KIN+CKTOT+MB+TROPI) - Abnormal; Notable for the  following:    Relative Index 3.3 (*)    All other components within normal limits  CBC - Abnormal; Notable for the following:    WBC 10.6 (*)    RBC 2.93 (*)    Hemoglobin 8.4 (*)    HCT 25.2 (*)    All other components within normal limits  COMPREHENSIVE METABOLIC PANEL - Abnormal; Notable for the following:    Sodium 128 (*)    Glucose, Bld 133 (*)    Total Protein 5.0 (*)    Albumin 2.5 (*)    GFR calc non Af Amer 60 (*)    GFR calc Af Amer 69 (*)    All other components within normal limits  MAGNESIUM - Abnormal; Notable for the following:    Magnesium 1.4 (*)    All other components within normal limits  PHOSPHORUS - Abnormal; Notable for the following:    Phosphorus 1.9 (*)    All other components within normal limits  CBC - Abnormal; Notable for the following:    WBC 11.6 (*)    RBC 3.05 (*)    Hemoglobin 8.6 (*)    HCT 25.9 (*)    All other components within normal limits  COMPREHENSIVE METABOLIC PANEL - Abnormal; Notable for the following:    Sodium 130 (*)    Glucose, Bld 138 (*)    Calcium 8.2 (*)    Total Protein 5.3 (*)    Albumin 2.5 (*)    GFR calc non Af Amer 72 (*)    GFR calc Af Amer 83 (*)    All other components within normal limits  CBC - Abnormal; Notable for the following:    WBC 13.0 (*)    RBC 3.10 (*)    Hemoglobin 8.8 (*)    HCT 26.4 (*)    All other components within normal limits  BASIC METABOLIC PANEL - Abnormal; Notable for the following:    Sodium 129 (*)    Glucose, Bld 129 (*)    GFR calc non Af Amer 70 (*)    GFR calc Af Amer 81 (*)    All other components within normal limits  CBC - Abnormal; Notable for the following:    WBC 12.4 (*)  RBC 3.13 (*)    Hemoglobin 8.9 (*)    HCT 26.7 (*)    All other components within normal limits  BASIC METABOLIC PANEL - Abnormal; Notable for the following:    Sodium 128 (*)    Chloride 95 (*)    Glucose, Bld 111 (*)    GFR calc non Af Amer 70 (*)    GFR calc Af Amer 81 (*)    All  other components within normal limits  URINALYSIS, ROUTINE W REFLEX MICROSCOPIC - Abnormal; Notable for the following:    Color, Urine ORANGE (*) BIOCHEMICALS MAY BE AFFECTED BY COLOR   Appearance CLOUDY (*)    Bilirubin Urine SMALL (*)    Ketones, ur TRACE (*)    Urobilinogen, UA 2.0 (*)    Nitrite POSITIVE (*)    Leukocytes, UA TRACE (*)    All other components within normal limits  BASIC METABOLIC PANEL - Abnormal; Notable for the following:    Sodium 131 (*)    Glucose, Bld 104 (*)    GFR calc non Af Amer 49 (*)    GFR calc Af Amer 57 (*)    All other components within normal limits  URINE MICROSCOPIC-ADD ON - Abnormal; Notable for the following:    Squamous Epithelial / LPF FEW (*)    Bacteria, UA MANY (*)    All other components within normal limits  RETICULOCYTES - Abnormal; Notable for the following:    RBC. 3.60 (*)    All other components within normal limits  CARDIAC PANEL(CRET KIN+CKTOT+MB+TROPI)  MAGNESIUM  TSH  CARDIAC PANEL(CRET KIN+CKTOT+MB+TROPI)  VITAMIN B12  RPR  OCCULT BLOOD X 1 CARD TO LAB, STOOL  URINE CULTURE  VITAMIN B12  FOLATE  IRON AND TIBC  FERRITIN        Dg Chest 2 View  07/04/2011  *RADIOLOGY REPORT*  Clinical Data: Hypertension, fall, pain  CHEST - 2 VIEW  Comparison: None  Findings: Upper normal heart size. Elongation aorta. Slight pulmonary vascular congestion. Bronchitic changes without infiltrate or effusion. No pneumothorax. Bones diffusely demineralized.  IMPRESSION: Bronchitic changes.  Original Report Authenticated By: Lollie Marrow, M.D.   Dg Lumbar Spine Complete  07/04/2011  *RADIOLOGY REPORT*  Clinical Data: Low back and bilateral leg pain, no known injury  LUMBAR SPINE - COMPLETE 4+ VIEW  Comparison: None  Findings: Osseous demineralization. Five non-rib bearing lumbar vertebrae. Levoconvex lumbar scoliosis apex L3. Multilevel disc space narrowing and endplate spur formation. Facet degenerative changes mid to lower lumbar spine.  Mild superior end plate height loss at L4 and questionably L3. These are age indeterminate. No additional fracture, subluxation or bone destruction. No spondylolysis. Scattered atherosclerotic calcifications aorta.  IMPRESSION: Osseous demineralization. Multilevel degenerative disc and facet disease changes of the lumbar spine with associated levoconvex scoliosis. Age indeterminate superior endplate compression deformity of L4, questionably L3.  Per CMS PQRS reporting requirements (PQRS Measure 24): Given the patient's age of greater than 50 and the fracture site (hip, distal radius, or spine), the patient should be tested for osteoporosis using DXA, and the appropriate treatment considered based on the DXA results.  Original Report Authenticated By: Lollie Marrow, M.D.   Dg Hip Bilateral Vito Berger  07/04/2011  *RADIOLOGY REPORT*  Clinical Data: Bilateral hip pain and stiffness secondary to a fall today.  BILATERAL HIP WITH PELVIS - 4+ VIEW  Comparison: None.  Findings: No fracture, dislocation, arthritis, or other significant abnormality of the hips or pelvis.  IMPRESSION: Normal exam.  Original Report Authenticated By: Gwynn Burly, M.D.   Dg Ankle 2 Views Left  07/04/2011  *RADIOLOGY REPORT*  Clinical Data: Fall, left ankle pain/bruising  LEFT ANKLE - 2 VIEW  Comparison: None.  Findings: No definite fracture is seen.  Mild irregularity of the medial malleolus.  The visualized soft tissues are grossly unremarkable.  IMPRESSION: No definite fracture is seen.  Mild irregularity of the medial malleolus.  Correlate with the site of the patient's pain.  Original Report Authenticated By: Charline Bills, M.D.   Ct Head Wo Contrast  07/04/2011  *RADIOLOGY REPORT*  Clinical Data: The patient fell.  CT HEAD WITHOUT CONTRAST  Technique:  Contiguous axial images were obtained from the base of the skull through the vertex without contrast.  Comparison: None.  Findings: There is no acute intracranial hemorrhage,  infarction, or mass lesion.  Mild cerebral cortical atrophy.  Prominent venous lakes in the occipital bone, not felt to be significant.  IMPRESSION: No acute intracranial abnormality.  Mild atrophy.  Original Report Authenticated By: Gwynn Burly, M.D.   Mr Lumbar Spine Wo Contrast  07/06/2011  *RADIOLOGY REPORT*  Clinical Data:  Leg weakness.  Rule out spinal stenosis.  MRI LUMBAR SPINE WITHOUT CONTRAST  Technique:  Multiplanar and multiecho pulse sequences of the lumbar spine were obtained without intravenous contrast.  Comparison:  Lumbar spine radiographs 07/04/2011.  Findings:  Normal signal is present in the conus medullaris which terminates at T12-L1.  Leftward curvature of the lumbar spine is centered at L3-4.  Superior endplate fractures at L3 and L4 are remote.  No acute or healing fractures are present.  Minimal endplate edematous changes are noted along the superior endplate of L5.  Slight anterolisthesis is present at L2-3.  Degenerative retrolisthesis at L4-5 measures 3 mm.  Limited imaging of the abdomen is unremarkable.  The disc levels are as follows.  T12-L1:  A far left lateral disc bulge is present.  There is no significant stenosis.  L1-2:  Leftward disc bulging is present.  The disc bulges into the inferior recess of the left neural foramen without significant stenosis.  L2-3:  Broad-based disc bulging is present.  There is uncovering of the disc associated with the anterolisthesis.  This results in mild right lateral recess narrowing.  The foramina are patent bilaterally.  L3-4:  Broad-based disc bulging is more prominent on the right. Facet hypertrophy is noted as well.  This results in moderate right lateral recess and foraminal stenosis.  The left foramen is patent.  L4-5:  Broad-based disc bulging is present.  Lateral recess narrowing is worse right than left.  Moderate to severe left and moderate right foraminal stenosis is present.  L5-S1:  Broad-based disc bulging is present.   Mild lateral recess narrowing is present bilaterally.  The foramina are patent. Moderate facet hypertrophy is noted.  IMPRESSION:  1.  Leftward curvature of lumbar spine is centered at L3-4. 2.  Superior endplate fractures at L3 and L4 are remote. 3.  Mild right lateral recess narrowing at L2-3. 4.  Moderate right lateral recess and foraminal stenosis at L3-4. 5.  Moderate to severe left and moderate right foraminal stenosis at L4-5. 6.  Lateral recess narrowing at L4-5 is worse right than left.  7.  Mild lateral recess narrowing bilaterally at L5-S1.  *RADIOLOGY REPORT*  MRI SACRUM WITHOUT CONTRAST  Technique:  Multiplanar and multiecho pulse sequences of the sacrum to include the sacroiliac joints and the coccyx were obtained without intravenous contrast.  Findings:  There is focal T1 signal loss and edema within the last sacral segment or proximal coccyx.  There is no displacement.  This likely represents a nondisplaced fracture.  There is no significant soft tissue injury surrounding.  The hips are within normal limits.  The sacral foramina are patent bilaterally.  The SI joints are unremarkable.  IMPRESSION:  1.  Nondisplaced fracture of the distal sacrum and proximal coccyx.  Original Report Authenticated By: Jamesetta Orleans. MATTERN, M.D.   Mr Sacrum/si Joints Wo Contrast  07/06/2011  *RADIOLOGY REPORT*  Clinical Data:  Leg weakness.  Rule out spinal stenosis.  MRI LUMBAR SPINE WITHOUT CONTRAST  Technique:  Multiplanar and multiecho pulse sequences of the lumbar spine were obtained without intravenous contrast.  Comparison:  Lumbar spine radiographs 07/04/2011.  Findings:  Normal signal is present in the conus medullaris which terminates at T12-L1.  Leftward curvature of the lumbar spine is centered at L3-4.  Superior endplate fractures at L3 and L4 are remote.  No acute or healing fractures are present.  Minimal endplate edematous changes are noted along the superior endplate of L5.  Slight anterolisthesis  is present at L2-3.  Degenerative retrolisthesis at L4-5 measures 3 mm.  Limited imaging of the abdomen is unremarkable.  The disc levels are as follows.  T12-L1:  A far left lateral disc bulge is present.  There is no significant stenosis.  L1-2:  Leftward disc bulging is present.  The disc bulges into the inferior recess of the left neural foramen without significant stenosis.  L2-3:  Broad-based disc bulging is present.  There is uncovering of the disc associated with the anterolisthesis.  This results in mild right lateral recess narrowing.  The foramina are patent bilaterally.  L3-4:  Broad-based disc bulging is more prominent on the right. Facet hypertrophy is noted as well.  This results in moderate right lateral recess and foraminal stenosis.  The left foramen is patent.  L4-5:  Broad-based disc bulging is present.  Lateral recess narrowing is worse right than left.  Moderate to severe left and moderate right foraminal stenosis is present.  L5-S1:  Broad-based disc bulging is present.  Mild lateral recess narrowing is present bilaterally.  The foramina are patent. Moderate facet hypertrophy is noted.  IMPRESSION:  1.  Leftward curvature of lumbar spine is centered at L3-4. 2.  Superior endplate fractures at L3 and L4 are remote. 3.  Mild right lateral recess narrowing at L2-3. 4.  Moderate right lateral recess and foraminal stenosis at L3-4. 5.  Moderate to severe left and moderate right foraminal stenosis at L4-5. 6.  Lateral recess narrowing at L4-5 is worse right than left.  7.  Mild lateral recess narrowing bilaterally at L5-S1.  *RADIOLOGY REPORT*  MRI SACRUM WITHOUT CONTRAST  Technique:  Multiplanar and multiecho pulse sequences of the sacrum to include the sacroiliac joints and the coccyx were obtained without intravenous contrast.  Findings:  There is focal T1 signal loss and edema within the last sacral segment or proximal coccyx.  There is no displacement.  This likely represents a nondisplaced  fracture.  There is no significant soft tissue injury surrounding.  The hips are within normal limits.  The sacral foramina are patent bilaterally.  The SI joints are unremarkable.  IMPRESSION:  1.  Nondisplaced fracture of the distal sacrum and proximal coccyx.  Original Report Authenticated By: Jamesetta Orleans. MATTERN, M.D.   Dg Foot 2 Views Left  07/04/2011  *RADIOLOGY REPORT*  Clinical Data: Fall,  left foot pain, bruising on the third through fifth toes and ankle  LEFT FOOT - 2 VIEW  Comparison: None.  Findings: No evidence of acute fracture or dislocation.  Deformity of the third, fourth, and fifth metatarsals, suggesting prior fracture.  The visualized soft tissues are unremarkable.  IMPRESSION: No evidence of acute fracture or dislocation.  Old fractures of the left third through fifth metatarsals.  Original Report Authenticated By: Charline Bills, M.D.       Brief H and P: For complete details please refer to admission H and P, but in brief  77 year old female, with multiple allergies to medications, came from home ,she used her walker to walk and she managed her self well without many falls. Presented this time after she experienced a fall yesterday ,at that time she denies any syncope ,as per patient, she tripped and fall, and at time denies any chest pain or shortness of breath , no seizure ,didnot hit her head , she managed to go to her bed and call for help, today her niece came to see her and as she tried to walk her to the bath room patient was unable to walk and then she felt dizzy and clammy and niece noticed episode of sweating and patient went gradually into the floor , she lose her consciousness for almost 2 minutes and when ambulance arrived patient was completely awake , denies any chest pain but she felt her legs are painful and stiff , denies any numbness on both legs, she has an EKG Admission which suggest afib but there is obvious P WAVE  Past Medical History         Hospital Course:  No resolved problems to display.  Active Hospital Problems  Diagnoses Date Noted   . Sacral insufficiency fracture 07/07/2011   . Syncope    . Fall    . A-fib    . HTN (hypertension)      Resolved Hospital Problems  Diagnoses Date Noted Date Resolved    Principal Problem:  Active Problems :1 *Sacral insufficiency fracture: Mobility/pain improved with PT. Continue PT and get OT as well. Still needs assist and moving slowly but improving. Continue PT at facility Active Problems:  2. Syncope/Fall: syncope W/U neg. Likely due to UTI/peripheral neuropathy. Cont PT  3. HTN (hypertension): better control with increase in norvasc. Cont. BB . May need additional agent added for optimal control. See Dr. Susie Cassette addendum 4 A-fib: rate controlled  5. UTI Cipro day #6. 2 more days of Cipro. 3 more days of pyridium for frequency 6.Constipation: Colace  7. Mild hyponatremia: likely chronic. Stable at baseline range  8. Anemia: likely of chronic disease vs acute bld loss. Will check anemia panel and guac stools. Hg stable. No s/sx of active blding. May need OP follow up. See Dr. Antionette Poles addendum 9. Dispo: likely to short term rehab today  LOS: 6 days    Syncope  Fall  HTN (hypertension)  A-fib   Time spent on Discharge: 50 minutes  Signed: Gwenyth Bender 07/10/2011, 2:00 PM   ADDENDUM :   Addendum to [facility] nurse practitioners discharge summary  #1 patient could benefit with the another antihypertensive medication such as hydralazine, she is allergic to Diovan, with some cross-reactivity with ACE inhibitors. Therefore we'll avoid hydrochlorothiazide ACE inhibitors and ARB use. For the time being continue with metoprolol and Norvasc.  #2 she was complaining of some urinary frequency yesterday, started on pyridium, will conitnue this for 3 more  days, she should continue with ciprofloxacin for another 2 days.  #3 hyponatremia, continues to improve  #4  anemia, would ensure that the patient received CBC, in the outpatient setting, patient currently not on any Lovenox or heparin for DVT prophylaxis because of her anemia. She should continue with TED stockings at the nursing home, and we should encourage ambulation as tolerated.

## 2011-07-10 NOTE — Progress Notes (Addendum)
Subjective: "I slept much better last night". No complaints  Objective: Vital signs Filed Vitals:   07/09/11 0625 07/09/11 1300 07/09/11 2127 07/10/11 0533  BP: 174/70 144/62 152/65 167/68  Pulse: 78 67 85 79  Temp: 98.9 F (37.2 C) 98.1 F (36.7 C) 98.3 F (36.8 C) 97.8 F (36.6 C)  TempSrc:  Oral Oral Oral  Resp: 18 18 18 18   Height:      Weight:      SpO2: 95% 96% 91% 93%   Weight change:  Last BM Date: 07/10/11  Intake/Output from previous day: 11/14 0701 - 11/15 0700 In: 340 [P.O.:340] Out: 1026 [Urine:1025; Stool:1] Total I/O In: 480 [P.O.:480] Out: -    Physical Exam: General: Alert, awake, oriented x3, in no acute distress. HEENT: No bruits, no goiter. Heart: Regular rate and rhythm, without murmurs, rubs, gallops. Lungs: Clear to auscultation bilaterally. Abdomen: Soft, nontender, nondistended, positive bowel sounds. Extremities: No clubbing cyanosis or edema with positive pedal pulses. Neuro: Grossly intact, nonfocal.    Lab Results: Basic Metabolic Panel:  Basename 07/10/11 0445 07/09/11 0515  NA 131* 128*  K 4.2 4.5  CL 97 95*  CO2 28 25  GLUCOSE 104* 111*  BUN 15 11  CREATININE 0.96 0.77  CALCIUM 9.6 9.9  MG -- --  PHOS -- --   Liver Function Tests: No results found for this basename: AST:2,ALT:2,ALKPHOS:2,BILITOT:2,PROT:2,ALBUMIN:2 in the last 72 hours No results found for this basename: LIPASE:2,AMYLASE:2 in the last 72 hours No results found for this basename: AMMONIA:2 in the last 72 hours CBC:  Basename 07/09/11 0515 07/08/11 0505  WBC 12.4* 13.0*  NEUTROABS -- --  HGB 8.9* 8.8*  HCT 26.7* 26.4*  MCV 85.3 85.2  PLT 218 207   Cardiac Enzymes: No results found for this basename: CKTOTAL:3,CKMB:3,CKMBINDEX:3,TROPONINI:3 in the last 72 hours BNP: No results found for this basename: POCBNP:3 in the last 72 hours D-Dimer: No results found for this basename: DDIMER:2 in the last 72 hours CBG: No results found for this basename:  GLUCAP:6 in the last 72 hours Hemoglobin A1C: No results found for this basename: HGBA1C in the last 72 hours Fasting Lipid Panel: No results found for this basename: CHOL,HDL,LDLCALC,TRIG,CHOLHDL,LDLDIRECT in the last 72 hours Thyroid Function Tests: No results found for this basename: TSH,T4TOTAL,FREET4,T3FREE,THYROIDAB in the last 72 hours Anemia Panel: No results found for this basename: VITAMINB12,FOLATE,FERRITIN,TIBC,IRON,RETICCTPCT in the last 72 hours Coagulation: No results found for this basename: LABPROT:2,INR:2 in the last 72 hours Urine Drug Screen:  Alcohol Level: No results found for this basename: ETH:2 in the last 72 hours Urinalysis:  Misc. Labs:  No results found for this or any previous visit (from the past 240 hour(s)).  Studies/Results: No results found.  Medications: Scheduled Meds:   . amLODipine  10 mg Oral Daily  . calcium-vitamin D  1 tablet Oral BID  . ciprofloxacin  500 mg Oral BID  . famotidine  20 mg Oral Daily  . metoprolol tartrate  12.5 mg Oral BID  . olmesartan  40 mg Oral Daily  . phenazopyridine  200 mg Oral TID WC  . polyethylene glycol  17 g Oral Daily  . DISCONTD: amLODipine  5 mg Oral Daily   Continuous Infusions:  PRN Meds:.spiritus frumenti  Assessment/Plan:  Principal Problem: 1 *Sacral insufficiency fracture: Mobility/pain improved with PT. Continue PT and get OT as well. Still needs assist and moving slowly but improving.  Active Problems: 2. Syncope/Fall: syncope W/U neg. Likely due to UTI/peripheral neuropathy. Cont PT  3. HTN (hypertension): better control with increase in norvasc. Cont. BB 4 A-fib: rate controlled 5. UTI Cipro day #6.  6.Constipation: Colace 7. Mild hyponatremia: likely chronic. Stable at baseline range 8. Anemia: likely of chronic disease vs acute bld loss. Will check anemia panel and guac stools. Hg stable. No s/sx of active blding. May need OP follow up. 9. Dispo: likely to short term rehab  today  LOS: 6 days   [facility] M 07/10/2011, 9:12 AM  ADDENDUM from Dr Susie Cassette  Addendum to Toya Smothers nurse practitioners discharge summary  #1 patient could benefit with the another antihypertensive medication such as hydralazine, she is allergic to Diovan, with some cross-reactivity with ACE inhibitors. Therefore we'll avoid hydrochlorothiazide ACE inhibitors and ARB use. For the time being continue with metoprolol and Norvasc.  #2 she was complaining of some urinary frequency yesterday, started on pyridium, will conitnue this for 3 more days, she should continue with ciprofloxacin for another 2 days.  #3 hyponatremia, continues to improve  #4 anemia, would ensure that the patient received CBC, in the outpatient setting, patient currently not on any Lovenox or heparin for DVT prophylaxis because of her anemia. She should continue with TED stockings at the nursing home, and we should encourage ambulation as tolerated.

## 2011-07-11 LAB — URINE CULTURE: Colony Count: 3000

## 2011-07-22 ENCOUNTER — Other Ambulatory Visit: Payer: Self-pay | Admitting: Internal Medicine

## 2011-07-30 ENCOUNTER — Other Ambulatory Visit: Payer: Medicare Other

## 2013-08-23 ENCOUNTER — Inpatient Hospital Stay (HOSPITAL_COMMUNITY)
Admission: EM | Admit: 2013-08-23 | Discharge: 2013-08-29 | DRG: 562 | Disposition: A | Discharge status: Skilled Nursing Facility | Payer: Medicare Other | Attending: Internal Medicine | Admitting: Internal Medicine

## 2013-08-23 ENCOUNTER — Emergency Department (HOSPITAL_COMMUNITY): Payer: Medicare Other

## 2013-08-23 ENCOUNTER — Encounter (HOSPITAL_COMMUNITY): Payer: Self-pay | Admitting: Internal Medicine

## 2013-08-23 DIAGNOSIS — IMO0002 Reserved for concepts with insufficient information to code with codable children: Secondary | ICD-10-CM

## 2013-08-23 DIAGNOSIS — S82402A Unspecified fracture of shaft of left fibula, initial encounter for closed fracture: Secondary | ICD-10-CM

## 2013-08-23 DIAGNOSIS — S81812A Laceration without foreign body, left lower leg, initial encounter: Secondary | ICD-10-CM

## 2013-08-23 DIAGNOSIS — S80812A Abrasion, left lower leg, initial encounter: Secondary | ICD-10-CM

## 2013-08-23 DIAGNOSIS — S82839A Other fracture of upper and lower end of unspecified fibula, initial encounter for closed fracture: Principal | ICD-10-CM | POA: Diagnosis present

## 2013-08-23 DIAGNOSIS — S81009A Unspecified open wound, unspecified knee, initial encounter: Secondary | ICD-10-CM | POA: Diagnosis present

## 2013-08-23 DIAGNOSIS — Z9089 Acquired absence of other organs: Secondary | ICD-10-CM

## 2013-08-23 DIAGNOSIS — E871 Hypo-osmolality and hyponatremia: Secondary | ICD-10-CM | POA: Diagnosis present

## 2013-08-23 DIAGNOSIS — I10 Essential (primary) hypertension: Secondary | ICD-10-CM | POA: Diagnosis present

## 2013-08-23 DIAGNOSIS — W19XXXA Unspecified fall, initial encounter: Secondary | ICD-10-CM

## 2013-08-23 DIAGNOSIS — R55 Syncope and collapse: Secondary | ICD-10-CM | POA: Diagnosis present

## 2013-08-23 DIAGNOSIS — Y92009 Unspecified place in unspecified non-institutional (private) residence as the place of occurrence of the external cause: Secondary | ICD-10-CM

## 2013-08-23 DIAGNOSIS — I4891 Unspecified atrial fibrillation: Secondary | ICD-10-CM | POA: Diagnosis present

## 2013-08-23 DIAGNOSIS — I498 Other specified cardiac arrhythmias: Secondary | ICD-10-CM | POA: Diagnosis present

## 2013-08-23 DIAGNOSIS — R296 Repeated falls: Secondary | ICD-10-CM | POA: Diagnosis present

## 2013-08-23 DIAGNOSIS — Z888 Allergy status to other drugs, medicaments and biological substances status: Secondary | ICD-10-CM

## 2013-08-23 DIAGNOSIS — I5032 Chronic diastolic (congestive) heart failure: Secondary | ICD-10-CM | POA: Diagnosis present

## 2013-08-23 DIAGNOSIS — D649 Anemia, unspecified: Secondary | ICD-10-CM | POA: Diagnosis present

## 2013-08-23 DIAGNOSIS — I509 Heart failure, unspecified: Secondary | ICD-10-CM | POA: Diagnosis present

## 2013-08-23 DIAGNOSIS — N390 Urinary tract infection, site not specified: Secondary | ICD-10-CM | POA: Diagnosis present

## 2013-08-23 DIAGNOSIS — Z91012 Allergy to eggs: Secondary | ICD-10-CM

## 2013-08-23 DIAGNOSIS — Z9181 History of falling: Secondary | ICD-10-CM

## 2013-08-23 DIAGNOSIS — Z882 Allergy status to sulfonamides status: Secondary | ICD-10-CM

## 2013-08-23 DIAGNOSIS — Z91013 Allergy to seafood: Secondary | ICD-10-CM

## 2013-08-23 DIAGNOSIS — Z79899 Other long term (current) drug therapy: Secondary | ICD-10-CM

## 2013-08-23 DIAGNOSIS — M8448XA Pathological fracture, other site, initial encounter for fracture: Secondary | ICD-10-CM

## 2013-08-23 DIAGNOSIS — I5033 Acute on chronic diastolic (congestive) heart failure: Secondary | ICD-10-CM | POA: Diagnosis present

## 2013-08-23 DIAGNOSIS — Z880 Allergy status to penicillin: Secondary | ICD-10-CM

## 2013-08-23 LAB — COMPREHENSIVE METABOLIC PANEL
Albumin: 3 g/dL — ABNORMAL LOW (ref 3.5–5.2)
Alkaline Phosphatase: 46 U/L (ref 39–117)
BUN: 27 mg/dL — ABNORMAL HIGH (ref 6–23)
CO2: 23 mEq/L (ref 19–32)
Calcium: 10.1 mg/dL (ref 8.4–10.5)
Chloride: 90 mEq/L — ABNORMAL LOW (ref 96–112)
Creatinine, Ser: 1.19 mg/dL — ABNORMAL HIGH (ref 0.50–1.10)
GFR calc Af Amer: 44 mL/min — ABNORMAL LOW (ref >90)
GFR calc non Af Amer: 38 mL/min — ABNORMAL LOW (ref >90)
Glucose, Bld: 159 mg/dL — ABNORMAL HIGH (ref 70–99)
Potassium: 4.5 mEq/L (ref 3.7–5.3)

## 2013-08-23 LAB — CBC WITH DIFFERENTIAL/PLATELET
Eosinophils Relative: 0 % (ref 0–5)
HCT: 29.7 % — ABNORMAL LOW (ref 36.0–46.0)
Lymphocytes Relative: 4 % — ABNORMAL LOW (ref 12–46)
Lymphs Abs: 0.5 10*3/uL — ABNORMAL LOW (ref 0.7–4.0)
MCV: 85.8 fL (ref 78.0–100.0)
Monocytes Absolute: 0.6 10*3/uL (ref 0.1–1.0)
Neutrophils Relative %: 91 % — ABNORMAL HIGH (ref 43–77)
RBC: 3.46 MIL/uL — ABNORMAL LOW (ref 3.87–5.11)
RDW: 13.1 % (ref 11.5–15.5)
WBC: 13.2 10*3/uL — ABNORMAL HIGH (ref 4.0–10.5)

## 2013-08-23 LAB — POCT I-STAT TROPONIN I

## 2013-08-23 MED ORDER — POLYETHYL GLYCOL-PROPYL GLYCOL 0.4-0.3 % OP SOLN: 1.0000 [drp] | Freq: Every day | OPHTHALMIC | Status: DC

## 2013-08-23 MED ORDER — ONDANSETRON HCL 4 MG/2ML IJ SOLN: 4.0000 mg | Freq: Four times a day (QID) | INTRAMUSCULAR | Status: DC | PRN

## 2013-08-23 MED ORDER — PREDNISONE 20 MG PO TABS
20.0000 mg | ORAL_TABLET | Freq: Two times a day (BID) | ORAL | Status: AC
  Administered 2013-08-24 (×2): 20 mg via ORAL
  Filled 2013-08-23 (×3): qty 1

## 2013-08-23 MED ORDER — MORPHINE SULFATE 2 MG/ML IJ SOLN: 2.0000 mg | INTRAMUSCULAR | Status: DC | PRN

## 2013-08-23 MED ORDER — LOTEPREDNOL ETABONATE 0.5 % OP SUSP
1.0000 [drp] | Freq: Every day | OPHTHALMIC | Status: DC | PRN
  Filled 2013-08-23: qty 5

## 2013-08-23 MED ORDER — DOCUSATE SODIUM 100 MG PO CAPS
100.0000 mg | ORAL_CAPSULE | Freq: Two times a day (BID) | ORAL | Status: DC
  Administered 2013-08-24 – 2013-08-29 (×10): 100 mg via ORAL
  Filled 2013-08-23 (×16): qty 1

## 2013-08-23 MED ORDER — CALCIUM CARBONATE-VITAMIN D 500-200 MG-UNIT PO TABS
2.0000 | ORAL_TABLET | Freq: Every day | ORAL | Status: DC
  Administered 2013-08-24 – 2013-08-29 (×6): 2 via ORAL
  Filled 2013-08-23 (×6): qty 2

## 2013-08-23 MED ORDER — ONDANSETRON HCL 4 MG PO TABS: 4.0000 mg | ORAL_TABLET | Freq: Four times a day (QID) | ORAL | Status: DC | PRN

## 2013-08-23 MED ORDER — DOCUSATE SODIUM 100 MG PO CAPS
100.0000 mg | ORAL_CAPSULE | Freq: Two times a day (BID) | ORAL | Status: DC
  Filled 2013-08-23 (×2): qty 1

## 2013-08-23 MED ORDER — BACITRACIN-NEOMYCIN-POLYMYXIN OINTMENT TUBE
TOPICAL_OINTMENT | Freq: Three times a day (TID) | CUTANEOUS | Status: DC
  Filled 2013-08-23: qty 15

## 2013-08-23 MED ORDER — DEXTROSE-NACL 5-0.9 % IV SOLN
INTRAVENOUS | Status: DC
  Administered 2013-08-24: via INTRAVENOUS

## 2013-08-23 MED ORDER — ACETAMINOPHEN 325 MG PO TABS: 650.0000 mg | ORAL_TABLET | Freq: Four times a day (QID) | ORAL | Status: DC | PRN

## 2013-08-23 MED ORDER — HEPARIN SODIUM (PORCINE) 5000 UNIT/ML IJ SOLN
5000.0000 [IU] | Freq: Three times a day (TID) | INTRAMUSCULAR | Status: DC
  Administered 2013-08-24 – 2013-08-29 (×16): 5000 [IU] via SUBCUTANEOUS
  Filled 2013-08-23 (×20): qty 1

## 2013-08-23 MED ORDER — TETANUS-DIPHTHERIA TOXOIDS TD 5-2 LFU IM INJ
0.5000 mL | INJECTION | Freq: Once | INTRAMUSCULAR | Status: AC
  Administered 2013-08-24: 0.5 mL via INTRAMUSCULAR
  Filled 2013-08-23 (×2): qty 0.5

## 2013-08-23 MED ORDER — VITAMIN D3 25 MCG (1000 UNIT) PO TABS
1000.0000 [IU] | ORAL_TABLET | Freq: Every day | ORAL | Status: DC
  Administered 2013-08-24 – 2013-08-29 (×6): 1000 [IU] via ORAL
  Filled 2013-08-23 (×6): qty 1

## 2013-08-23 MED ORDER — ACETAMINOPHEN 650 MG RE SUPP: 650.0000 mg | Freq: Four times a day (QID) | RECTAL | Status: DC | PRN

## 2013-08-23 MED ORDER — SODIUM CHLORIDE 0.9 % IV BOLUS (SEPSIS)
500.0000 mL | Freq: Once | INTRAVENOUS | Status: AC
  Administered 2013-08-23: 500 mL via INTRAVENOUS

## 2013-08-23 MED ORDER — IRBESARTAN 300 MG PO TABS
300.0000 mg | ORAL_TABLET | Freq: Every day | ORAL | Status: DC
  Administered 2013-08-24 – 2013-08-29 (×6): 300 mg via ORAL
  Filled 2013-08-23 (×6): qty 1

## 2013-08-23 NOTE — Progress Notes (Signed)
ED CM  Noted patient s/p fall..  Pt sustained a large wound to the left  shin, from recent fall  In to room to speak with patient after obtaining permission to speak with family at bedside.  Pt states, she lives alone, but has a niece Malachy Mood [phone] and a friends who  comes to the home to check on her.Marland KitchenPOA is Rolla Flatten [phone]. Pt awaiting transfer to x-ray for further evaluation needed for disposition plan. Explained to patient and family that CM will continue to follow for disposition .

## 2013-08-23 NOTE — ED Notes (Signed)
Telfa pad with saline applied to left lower leg and wrapped with Kerlix.

## 2013-08-23 NOTE — ED Notes (Signed)
Pt to ED with c/o falling.  St's she has fallen x's 2 today.  Pt st's he legs are giving out on her.  Pt has lg. Skin tear to left lower leg and lg. bruise to right lower leg.

## 2013-08-23 NOTE — ED Provider Notes (Signed)
CSN: [id number]     Arrival date & time 08/23/13  1907 History   First MD Initiated Contact with Patient 08/23/13 1910     Chief Complaint  Patient presents with  . Fall   (Consider location/radiation/quality/duration/timing/severity/associated sxs/prior Treatment) HPI Shirley Strickland is a 79 y.o. female who presents emergency department after multiple falls at home. Patient had a syncopal episode 3 days ago. She actually was seen by her primary care physician today who advised her that it is unsafe for her to live at home and recommended placement. Family state that after stay brought the patient home she has had 2 more falls at home today. Patient denies any dizziness or lightheadedness during the fall Sunday. She states that her legs are weak and just gave out. Patient had injuring her bilateral shins during the fall. Large abrasion skin tear noted to the left shin. Patient unable to walk on the leg. Patient reports worsening generalized weakness for several weeks. Patient does live alone. Denies any recent illnesses. Denies any fever, chills, cough, urinary symptoms.  Past Medical History  Diagnosis Date  . 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 Cancer     pt states only skin cancer  . Numbness of legs     started after fall yesterday 07/03/11  . Bruise     bruise on left foot from fall 07/03/11  . Knee pain, acute 07/04/11    Pt states left knee twisted under her in fall 07/03/11 and now painful  . Rash and nonspecific skin eruption 07/04/11    rash on bilateral upper legs  . Syncope   . Fall   . HTN (hypertension)   . A-fib    Past Surgical History  Procedure Laterality Date  . Elbow surgery      right  . Appendectomy    . Exploratory laparotomy  07/04/11    years ago to find cause of bleeding   No family history on file. History  Substance Use Topics  . Smoking status: Never Smoker   . Smokeless tobacco: Never Used  . Alcohol Use: 1.2 oz/week    2 Cans of beer per week       Comment: 2 cans beer per day   OB History   Grav Para Term Preterm Abortions TAB SAB Ect Mult Living                 Review of Systems  Constitutional: Negative for fever and chills.  Respiratory: Negative for cough, chest tightness and shortness of breath.   Cardiovascular: Negative for chest pain, palpitations and leg swelling.  Gastrointestinal: Negative for nausea, vomiting, abdominal pain and diarrhea.  Genitourinary: Negative for dysuria, flank pain, vaginal bleeding, vaginal discharge, vaginal pain and pelvic pain.  Musculoskeletal: Negative for arthralgias, myalgias, neck pain and neck stiffness.  Skin: Positive for wound. Negative for rash.  Neurological: Positive for dizziness, syncope and weakness. Negative for headaches.  All other systems reviewed and are negative.    Allergies  Benzonatate; Clarithromycin; Cod; Codeine; Darvocet; Demadex; Diovan hct; Ex-lax; Ibuprofen; Indanediones; Indapamide; Micardis; Morphine and related; Penicillins; and Septra  Home Medications   Current Outpatient Rx  Name  Route  Sig  Dispense  Refill  . EXPIRED: amLODipine (NORVASC) 10 MG tablet   Oral   Take 1 tablet (10 mg total) by mouth daily.   30 tablet   0   . calcium carbonate (OS-CAL - DOSED IN MG OF ELEMENTAL CALCIUM) 1250 MG tablet  Oral   Take 1 tablet by mouth daily.           Marland Kitchen EXPIRED: calcium-vitamin D (OSCAL WITH D) 500-200 MG-UNIT per tablet   Oral   Take 1 tablet by mouth 2 (two) times daily.   30 tablet   0   . cholecalciferol (VITAMIN D) 1000 UNITS tablet   Oral   Take 2,000 Units by mouth daily.           . metoprolol tartrate (LOPRESSOR) 12.5 mg TABS   Oral   Take 0.5 tablets (12.5 mg total) by mouth 2 (two) times daily.   30 tablet   0   . predniSONE (DELTASONE) 20 MG tablet   Oral   Take 20 mg by mouth daily.           Marland Kitchen spiritus frumenti (ETHYL ALCOHOL) SOLN   Oral   Take 1 each by mouth daily as needed (withdrawal).   30 each    0   . telmisartan (MICARDIS) 80 MG tablet   Oral   Take 80 mg by mouth daily.            BP 158/53  Pulse 107  Temp(Src) 99.9 F (37.7 C) (Oral)  Resp 16  Ht 5\' 6"  (1.676 m)  Wt 136 lb (61.689 kg)  BMI 21.96 kg/m2  SpO2 97% Physical Exam  Nursing note reviewed. Constitutional: She is oriented to person, place, and time. She appears well-developed and well-nourished. No distress.  HENT:  Head: Normocephalic and atraumatic.  Eyes: Conjunctivae and EOM are normal. Pupils are equal, round, and reactive to light.  Neck: Normal range of motion. Neck supple.  Cardiovascular: Normal rate, regular rhythm and normal heart sounds.   Pulmonary/Chest: Effort normal and breath sounds normal. No respiratory distress. She has no wheezes. She has no rales.  Musculoskeletal:  Mild swelling and tenderness around right ankle. Pain with ROM of the right ankle. Hematoma to the right inferior shin.  Large skin tear full skin thickness to the left anterior shin from just inferior to the knee to just superior of the ankle. Hemostatic at this time. Pain with left knee and left ankle ROM. Dorsal pedal pulses intact bilaterally  Neurological: She is alert and oriented to person, place, and time. No cranial nerve deficit. Coordination normal.  Skin: Skin is warm and dry.    ED Course  Procedures (including critical care time)     Labs Review Labs Reviewed  CBC WITH DIFFERENTIAL - Abnormal; Notable for the following:    WBC 13.2 (*)    RBC 3.46 (*)    Hemoglobin 10.1 (*)    HCT 29.7 (*)    Neutrophils Relative % 91 (*)    Neutro Abs 12.1 (*)    Lymphocytes Relative 4 (*)    Lymphs Abs 0.5 (*)    All other components within normal limits  COMPREHENSIVE METABOLIC PANEL - Abnormal; Notable for the following:    Sodium 127 (*)    Chloride 90 (*)    Glucose, Bld 159 (*)    BUN 27 (*)    Creatinine, Ser 1.19 (*)    Albumin 3.0 (*)    GFR calc non Af Amer 38 (*)    GFR calc Af Amer 44 (*)     All other components within normal limits  URINALYSIS W MICROSCOPIC + REFLEX CULTURE  POCT I-STAT TROPONIN I   Imaging Review Dg Tibia/fibula Left  08/23/2013   CLINICAL DATA:  Larey Seat today,  with pain  EXAM: LEFT TIBIA AND FIBULA - 2 VIEW  COMPARISON:  None.  FINDINGS: There is mild deformity of the neck of the fibula. There appears to be callus formation in this area. An acute fracture line is not appreciated.  IMPRESSION: Mild deformity of the neck of the fibula with evidence of periosteal reaction. This could be due to prior fracture with interval healing. If there is point tenderness corresponding to this area, an acute fracture should be suspected.   Electronically Signed   By: Esperanza Heir M.D.   On: 08/23/2013 21:52   Dg Ankle Complete Right  08/23/2013   CLINICAL DATA:  Fall with right ankle pain  EXAM: RIGHT ANKLE - COMPLETE 3+ VIEW  COMPARISON:  None.  FINDINGS: Diffuse osteopenia. Subtle lucency anterior tibial distal epiphysis likely a nutrient foramen. No fracture or joint effusion. Mortise is intact.  IMPRESSION: No acute traumatic osseous abnormality, although would recommend follow up imaging if symptoms persist in one week given diffuse osteopenia.   Electronically Signed   By: Esperanza Heir M.D.   On: 08/23/2013 21:56    EKG Interpretation    Date/Time:  Tuesday August 23 2013 20:26:29 EST Ventricular Rate:  106 PR Interval:  124 QRS Duration: 88 QT Interval:  325 QTC Calculation: 431 R Axis:   67 Text Interpretation:  Sinus tachycardia Minimal ST depression, diffuse leads since last tracing no significant change Confirmed by WENTZ  MD, ELLIOTT ([id number]) on 08/23/2013 8:47:12 PM            MDM   1. Fall, initial encounter   2. Abrasion of left leg, initial encounter   3. A-fib      Pt with weakness, falls, wound to the left anterior shin, cleaned with saline, saline non stick dressing applied. See picture above. X-ray showing possible acute vs subacute  fracture. Pt admitted to [facility].     10:35 PM  Discussed with Dr. Eulah Pont, will see in AM. Recommended possible plastics consult.       Lottie Mussel, PA-C 08/23/13 2332

## 2013-08-23 NOTE — ED Provider Notes (Signed)
  Face-to-face evaluation   History: Patient with accidental fall today. He has generalized increasing weakness and was told by her PCP, today, which needs to be placed in an assisted living facility. She is on prednisone for a pruritic rash. She has multiple medication allergies  Physical exam: Elderly female. Is a large gaping, laceration of the left anterior shin, that is not able to be closed primarily secondary to extremely friable skin, and the gaping nature of the wound.  Medical screening examination/treatment/procedure(s) were conducted as a shared visit with non-physician practitioner(s) and myself.  I personally evaluated the patient during the encounter  Flint Melter, MD 08/23/13 [phone]

## 2013-08-23 NOTE — Progress Notes (Signed)
Report received. Room ready for admission.  

## 2013-08-23 NOTE — H&P (Signed)
[facility] History and Physical  MARILYNN EKSTEIN HYQ:[id number] DOB: [birth year]    PCP:   Junious Silk, MD   Chief Complaint: recurrent falls.  HPI: Shirley Strickland is an 79 y.o. female with hx of HTN, easy bruisability, hx of afib, had a syncopal episode a week ago, hurting her right ankle, presents to the ER with 3 falls today.  She didn't lose her consciousness these three times, only saying that her legs were weak and gave out.  She had no CP, SOB, palpitation, fever, chills, nausea, vomtiing or black stool.  She had a large flap laceration on her left lower leg.  She doesn't recall her last dT.  X ray of the right ankle showed no Fx, with recommendation of follow up imaging.  Xray of the left Tibia-Fib showed no obvious Fx, but there is some periosteal elevation, could represent old healing or acute fracture.  Her Hb was 12g/dL, and her Na was 952 mEq/L.  She did have some hyponatremia before, but her last one was 130's.  Hospitalist was asked to admit her for further evalaution and treatment.  Rewiew of Systems:  Constitutional: Negative for malaise, fever and chills. No significant weight loss or weight gain Eyes: Negative for eye pain, redness and discharge, diplopia, visual changes, or flashes of light. ENMT: Negative for ear pain, hoarseness, nasal congestion, sinus pressure and sore throat. No headaches; tinnitus, drooling, or problem swallowing. Cardiovascular: Negative for chest pain, palpitations, diaphoresis, dyspnea and peripheral edema. ; No orthopnea, PND Respiratory: Negative for cough, hemoptysis, wheezing and stridor. No pleuritic chestpain. Gastrointestinal: Negative for nausea, vomiting, diarrhea, constipation, abdominal pain, melena, blood in stool, hematemesis, jaundice and rectal bleeding.    Genitourinary: Negative for frequency, dysuria, incontinence,flank pain and hematuria; Musculoskeletal: Negative for back pain and neck pain. Negative for swelling and  trauma.;  Skin: . Negative for pruritus, or rash. Neuro: Negative for headache, lightheadedness and neck stiffness. Negative for weakness, altered level of consciousness , altered mental status, burning feet, involuntary movement, seizure .  Psych: negative for anxiety, depression, insomnia, tearfulness, panic attacks, hallucinations, paranoia, suicidal or homicidal ideation    Past Medical History  Diagnosis Date  . 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 Cancer     pt states only skin cancer  . Numbness of legs     started after fall yesterday 07/03/11  . Bruise     bruise on left foot from fall 07/03/11  . Knee pain, acute 07/04/11    Pt states left knee twisted under her in fall 07/03/11 and now painful  . Rash and nonspecific skin eruption 07/04/11    rash on bilateral upper legs  . Syncope   . Fall   . HTN (hypertension)   . A-fib     Past Surgical History  Procedure Laterality Date  . Elbow surgery      right  . Appendectomy    . Exploratory laparotomy  07/04/11    years ago to find cause of bleeding    Medications:  HOME MEDS: Prior to Admission medications   Medication Sig Start Date End Date Taking? Authorizing Provider  calcium-vitamin D (OSCAL WITH D) 500-200 MG-UNIT per tablet Take 2 tablets by mouth daily.   Yes Historical Provider, MD  cholecalciferol (VITAMIN D) 1000 UNITS tablet Take 1,000 Units by mouth daily.    Yes Historical Provider, MD  Docusate Calcium (STOOL SOFTENER PO) Take 1 tablet by mouth at bedtime as needed (constipation).  Yes Historical Provider, MD  loteprednol (LOTEMAX) 0.5 % ophthalmic suspension Place 1 drop into both eyes daily as needed (itching).   Yes Historical Provider, MD  Polyethyl Glycol-Propyl Glycol (SYSTANE OP) Place 1 drop into both eyes daily.   Yes Historical Provider, MD  predniSONE (DELTASONE) 20 MG tablet Take 20 mg by mouth See admin instructions. Take 1 tablet (20 mg) twice daily for 4 days, then take 1 tablet (20 mg)  daily for 4 days   Yes Historical Provider, MD  telmisartan (MICARDIS) 80 MG tablet Take 80 mg by mouth daily.     Yes Historical Provider, MD     Allergies:  Allergies  Allergen Reactions  . Clarithromycin Nausea Only  . Cod [employer] Allergy] Other (See Comments)    Stomach pains and passed out recently - doesn't remember reaction before this weekend  . Codeine Nausea And Vomiting  . Darvocet [Propoxyphene N-Acetaminophen] Nausea And Vomiting  . Demadex [Torsemide] Hives and Nausea And Vomiting    UNKNOWN  . Diovan Hct [Valsartan-Hydrochlorothiazide] Other (See Comments)    unknown  . Eggs Or Egg-Derived Products Nausea And Vomiting  . Ex-Lax [Senna] Other (See Comments)    unknown  . Ibuprofen Nausea Only  . Indanediones Other (See Comments)    unknown  . Indapamide Other (See Comments)    unknown  . Morphine And Related Nausea And Vomiting  . Sulfa Antibiotics Other (See Comments)    Couldn't breathe  . Benzonatate Itching and Rash    Eyes swelled  . Penicillins Rash  . Septra [Bactrim] Rash    Social History:   reports that she has never smoked. She has never used smokeless tobacco. She reports that she drinks about 1.2 ounces of alcohol per week. She reports that she uses illicit drugs.  Family History: No family history on file.   Physical Exam: Filed Vitals:   08/23/13 1919 08/23/13 1959 08/23/13 2216 08/23/13 2247  BP: 158/53 149/55  145/47  Pulse: 107 105  104  Temp: 99.9 F (37.7 C)  99.3 F (37.4 C)   TempSrc: Oral  Oral   Resp: 16 23  25   Height: 5\' 6"  (1.676 m)     Weight: 61.689 kg (136 lb)     SpO2: 97% 99%  93%   Blood pressure 145/47, pulse 104, temperature 99.3 F (37.4 C), temperature source Oral, resp. rate 25, height 5\' 6"  (1.676 m), weight 61.689 kg (136 lb), SpO2 93.00%.  GEN:  Pleasant  patient lying in the stretcher in no acute distress; cooperative with exam. PSYCH:  alert and oriented x4; does not appear anxious or depressed; affect  is appropriate. HEENT: Mucous membranes pink and anicteric; PERRLA; EOM intact; no cervical lymphadenopathy nor thyromegaly or carotid bruit; no JVD; There were no stridor. Neck is very supple. Breasts:: Not examined CHEST WALL: No tenderness CHEST: Normal respiration, clear to auscultation bilaterally.  HEART: Regular rate and rhythm.  There are no murmur, rub, or gallops.   BACK: No kyphosis or scoliosis; no CVA tenderness ABDOMEN: soft and non-tender; no masses, no organomegaly, normal abdominal bowel sounds; no pannus; no intertriginous candida. There is no rebound and no distention. Rectal Exam: Not done EXTREMITIES: No bone or joint deformity; age-appropriate arthropathy of the hands and knees; no edema; no ulcerations.  There is no calf tenderness.  She has a large flap laceration on her left lower leg.  She has contusion on her right lower leg. Genitalia: not examined PULSES: 2+ and symmetric SKIN:  Normal hydration no rash or ulceration CNS: Cranial nerves 2-12 grossly intact no focal lateralizing neurologic deficit.  Speech is fluent; uvula elevated with phonation, facial symmetry and tongue midline. DTR are normal bilaterally, cerebella exam is intact, barbinski is negative and strengths are equaled bilaterally.  No sensory loss.   Labs on Admission:  Basic Metabolic Panel:  Recent Labs Lab 08/23/13 2008  NA 127*  K 4.5  CL 90*  CO2 23  GLUCOSE 159*  BUN 27*  CREATININE 1.19*  CALCIUM 10.1   Liver Function Tests:  Recent Labs Lab 08/23/13 2008  AST 15  ALT 10  ALKPHOS 46  BILITOT 0.6  PROT 6.2  ALBUMIN 3.0*   No results found for this basename: LIPASE, AMYLASE,  in the last 168 hours No results found for this basename: AMMONIA,  in the last 168 hours CBC:  Recent Labs Lab 08/23/13 2008  WBC 13.2*  NEUTROABS 12.1*  HGB 10.1*  HCT 29.7*  MCV 85.8  PLT 176   Cardiac Enzymes: No results found for this basename: CKTOTAL, CKMB, CKMBINDEX, TROPONINI,  in  the last 168 hours  CBG: No results found for this basename: GLUCAP,  in the last 168 hours   Radiological Exams on Admission: Dg Tibia/fibula Left  08/23/2013   CLINICAL DATA:  Larey Seat today, with pain  EXAM: LEFT TIBIA AND FIBULA - 2 VIEW  COMPARISON:  None.  FINDINGS: There is mild deformity of the neck of the fibula. There appears to be callus formation in this area. An acute fracture line is not appreciated.  IMPRESSION: Mild deformity of the neck of the fibula with evidence of periosteal reaction. This could be due to prior fracture with interval healing. If there is point tenderness corresponding to this area, an acute fracture should be suspected.   Electronically Signed   By: Esperanza Heir M.D.   On: 08/23/2013 21:52   Dg Ankle Complete Right  08/23/2013   CLINICAL DATA:  Fall with right ankle pain  EXAM: RIGHT ANKLE - COMPLETE 3+ VIEW  COMPARISON:  None.  FINDINGS: Diffuse osteopenia. Subtle lucency anterior tibial distal epiphysis likely a nutrient foramen. No fracture or joint effusion. Mortise is intact.  IMPRESSION: No acute traumatic osseous abnormality, although would recommend follow up imaging if symptoms persist in one week given diffuse osteopenia.   Electronically Signed   By: Esperanza Heir M.D.   On: 08/23/2013 21:56    EKG: Independently reviewed. NSR, no acute changes.   Assessment/Plan Present on Admission:  . Syncope and collapse . HTN (hypertension) . A-fib Flap Laceration of left lower leg. Old vs acute Fx neck of left Fibula.  PLAN:  Will admit her for further Tx.  She will be given Bacitracin and dress with respect to her left lower leg laceration.  Will give Td and toxoid.  She is in NSR now, but had hx of afib, will admit to telemetry as she did have syncopy one week ago, will need ECHO as well.  She also has hyponatremia, and though I suspect hypovolemic, will obtain urine Na to get FeNa.  I have requested orthopedics consult also for her leg trauma.  She  will need at least STR as she cannot go home, living alone, with frequent falls.  She is stable, full code (confirmed tonight), and will be admitted to [facility] service.  Thank you for asking me to participate in her care.   Other plans as per orders.  Code Status: FULL CODE.  Houston Siren, MD. [facility] Pager [phone] 7pm to 7am.  08/23/2013, 10:56 PM

## 2013-08-23 NOTE — ED Notes (Signed)
Patient put on bedpan but was unable to urinate

## 2013-08-23 NOTE — ED Notes (Signed)
Care manager in talking with pt and family.  Pt to x-ray at this time.

## 2013-08-24 ENCOUNTER — Inpatient Hospital Stay (HOSPITAL_COMMUNITY): Payer: Medicare Other

## 2013-08-24 ENCOUNTER — Encounter (HOSPITAL_COMMUNITY): Payer: Self-pay | Admitting: *Deleted

## 2013-08-24 DIAGNOSIS — I10 Essential (primary) hypertension: Secondary | ICD-10-CM

## 2013-08-24 DIAGNOSIS — R55 Syncope and collapse: Secondary | ICD-10-CM

## 2013-08-24 DIAGNOSIS — S82402A Unspecified fracture of shaft of left fibula, initial encounter for closed fracture: Secondary | ICD-10-CM

## 2013-08-24 LAB — URINALYSIS W MICROSCOPIC + REFLEX CULTURE
Nitrite: NEGATIVE
Specific Gravity, Urine: 1.017 (ref 1.005–1.030)
Urobilinogen, UA: 0.2 mg/dL (ref 0.0–1.0)

## 2013-08-24 LAB — CREATININE, SERUM: GFR calc non Af Amer: 44 mL/min — ABNORMAL LOW (ref >90)

## 2013-08-24 LAB — CBC
HCT: 25.9 % — ABNORMAL LOW (ref 36.0–46.0)
MCH: 28.7 pg (ref 26.0–34.0)
MCHC: 33.6 g/dL (ref 30.0–36.0)
MCV: 85.5 fL (ref 78.0–100.0)
Platelets: 152 10*3/uL (ref 150–400)
RDW: 13.2 % (ref 11.5–15.5)
WBC: 14.1 10*3/uL — ABNORMAL HIGH (ref 4.0–10.5)

## 2013-08-24 LAB — CREATININE, URINE, RANDOM: Creatinine, Urine: 128.39 mg/dL

## 2013-08-24 MED ORDER — DIPHENHYDRAMINE HCL 25 MG PO CAPS
25.0000 mg | ORAL_CAPSULE | Freq: Four times a day (QID) | ORAL | Status: DC | PRN
  Administered 2013-08-24: 25 mg via ORAL
  Filled 2013-08-24: qty 1

## 2013-08-24 MED ORDER — PREDNISONE 20 MG PO TABS
20.0000 mg | ORAL_TABLET | Freq: Every day | ORAL | Status: DC
  Administered 2013-08-25 – 2013-08-26 (×2): 20 mg via ORAL
  Filled 2013-08-24 (×3): qty 1

## 2013-08-24 MED ORDER — SODIUM CHLORIDE 0.9 % IV SOLN
INTRAVENOUS | Status: DC
  Administered 2013-08-24 – 2013-08-25 (×2): via INTRAVENOUS

## 2013-08-24 MED ORDER — POLYVINYL ALCOHOL 1.4 % OP SOLN
1.0000 [drp] | Freq: Every day | OPHTHALMIC | Status: DC
  Administered 2013-08-24 – 2013-08-29 (×5): 1 [drp] via OPHTHALMIC
  Filled 2013-08-24: qty 15

## 2013-08-24 NOTE — Progress Notes (Addendum)
[facility] PROGRESS NOTE  Shirley Strickland UXL:[id number] DOB: 1915-12-28 DOA: 08/23/2013 PCP: Junious Silk, MD  HPI/Subjective: Daughter at bedside, no complains about pain.  Assessment/Plan: Principal Problem:   Left fibular fracture Active Problems:   Fall   A-fib   Laceration of left leg   Syncope and collapse   HTN (hypertension)   Fall at home   Syncope and fall -An unwitnessed falls at home, this was thought to be syncopal from before. -Came in to the hospital with laceration of her left leg. -2-D echocardiogram, 12-lead EKG and telemetry. -Rule out acute coronary syndrome.  Left leg laceration and fibula fracture -Seen by orthopedics, recommended plastic surgery consultation. -Per orthopedics this is nonoperative fracture.  Hyponatremia -Chronic hyponatremia, sodium 127. -Obtain urine osmole, plasma all small, urine sodium and creatinine.  Atrial fibrillation -Obviously history of atrial fibrillation as her rhythm as sinus rhythm now. -She is not on anticoagulation as outpatient.  Addendum -I called the operator and the ER, there is no plastic and reconstructive surgeon on call.  Code Status: Full code Family Communication: Plan discussed with the patient. Disposition Plan: Remains inpatient   Consultants:  Dr. Eulah Pont  Procedures:  None  Antibiotics:  None   Objective: Filed Vitals:   08/24/13 1347  BP: 157/57  Pulse: 92  Temp: 98.1 F (36.7 C)  Resp: 18    Intake/Output Summary (Last 24 hours) at 08/24/13 1416 Last data filed at 08/24/13 1300  Gross per 24 hour  Intake    600 ml  Output    600 ml  Net      0 ml   Filed Weights   08/23/13 1919  Weight: 61.689 kg (136 lb)    Exam: General: Alert and awake, oriented x3, not in any acute distress. HEENT: anicteric sclera, pupils reactive to light and accommodation, EOMI CVS: S1-S2 clear, no murmur rubs or gallops Chest: clear to auscultation bilaterally, no wheezing,  rales or rhonchi Abdomen: soft nontender, nondistended, normal bowel sounds, no organomegaly Extremities: no cyanosis, clubbing or edema noted bilaterally Neuro: Cranial nerves II-XII intact, no focal neurological deficits  Data Reviewed: Basic Metabolic Panel:  Recent Labs Lab 08/23/13 2008 08/24/13 0157  NA 127*  --   K 4.5  --   CL 90*  --   CO2 23  --   GLUCOSE 159*  --   BUN 27*  --   CREATININE 1.19* 1.05  CALCIUM 10.1  --    Liver Function Tests:  Recent Labs Lab 08/23/13 2008  AST 15  ALT 10  ALKPHOS 46  BILITOT 0.6  PROT 6.2  ALBUMIN 3.0*   No results found for this basename: LIPASE, AMYLASE,  in the last 168 hours No results found for this basename: AMMONIA,  in the last 168 hours CBC:  Recent Labs Lab 08/23/13 2008 08/24/13 0157  WBC 13.2* 14.1*  NEUTROABS 12.1*  --   HGB 10.1* 8.7*  HCT 29.7* 25.9*  MCV 85.8 85.5  PLT 176 152   Cardiac Enzymes: No results found for this basename: CKTOTAL, CKMB, CKMBINDEX, TROPONINI,  in the last 168 hours BNP (last 3 results) No results found for this basename: PROBNP,  in the last 8760 hours CBG: No results found for this basename: GLUCAP,  in the last 168 hours  Micro No results found for this or any previous visit (from the past 240 hour(s)).   Studies: Dg Tibia/fibula Left  08/23/2013   CLINICAL DATA:  Larey Seat today, with pain  EXAM: LEFT TIBIA AND FIBULA - 2 VIEW  COMPARISON:  None.  FINDINGS: There is mild deformity of the neck of the fibula. There appears to be callus formation in this area. An acute fracture line is not appreciated.  IMPRESSION: Mild deformity of the neck of the fibula with evidence of periosteal reaction. This could be due to prior fracture with interval healing. If there is point tenderness corresponding to this area, an acute fracture should be suspected.   Electronically Signed   By: Esperanza Heir M.D.   On: 08/23/2013 21:52   Dg Ankle Complete Left  08/24/2013   CLINICAL DATA:   Left ankle pain and lower is regular Fall.  EXAM: LEFT ANKLE COMPLETE - 3+ VIEW  COMPARISON:  Tibia and fibula 08/23/2013.  Left ankle 07/04/2011.  FINDINGS: Soft tissue lacerations demonstrated over the lateral aspect of the distal left lower leg. Diffuse bone demineralization. No displaced fractures demonstrated in the left ankle. There is suggestion of very fracture or deformity of the 5th metatarsal bone. This likely represents old fracture deformity, in comparison to previous left foot radiograph from 07/04/2011.  IMPRESSION: Old metatarsal fracture deformity. Soft tissue lacerations. Diffuse bone demineralization. No acute fracture demonstrated in the left ankle.   Electronically Signed   By: Burman Nieves M.D.   On: 08/24/2013 01:26   Dg Ankle Complete Right  08/23/2013   CLINICAL DATA:  Fall with right ankle pain  EXAM: RIGHT ANKLE - COMPLETE 3+ VIEW  COMPARISON:  None.  FINDINGS: Diffuse osteopenia. Subtle lucency anterior tibial distal epiphysis likely a nutrient foramen. No fracture or joint effusion. Mortise is intact.  IMPRESSION: No acute traumatic osseous abnormality, although would recommend follow up imaging if symptoms persist in one week given diffuse osteopenia.   Electronically Signed   By: Esperanza Heir M.D.   On: 08/23/2013 21:56    Scheduled Meds: . calcium-vitamin D  2 tablet Oral Daily  . cholecalciferol  1,000 Units Oral Daily  . docusate sodium  100 mg Oral BID  . docusate sodium  100 mg Oral BID  . heparin  5,000 Units Subcutaneous Q8H  . irbesartan  300 mg Oral Daily  . neomycin-bacitracin-polymyxin   Topical TID  . polyvinyl alcohol  1 drop Both Eyes Daily  . predniSONE  20 mg Oral BID  . [START ON 08/25/2013] predniSONE  20 mg Oral Q breakfast   Continuous Infusions: . dextrose 5 % and 0.9% NaCl 50 mL/hr at 08/24/13 0028       Time spent: 35 minutes    [facility]  [facility] Pager [phone] If 7PM-7AM, please contact night-coverage at  [email or web address], password [email or web address] 08/24/2013, 2:16 PM  LOS: 1 day

## 2013-08-24 NOTE — Consult Note (Addendum)
WOC wound consult note Reason for Consult: Consult requested for left leg abrasion.  Pt fell on concrete at home.   Wound type: Full thickness; 60% of skin rolled back over wound bed; dark purple and bruised.  Some areas may be non-viable.  25% of wound is open with skin loss, dark red and moist.  15% of wound is located over shin bone with exposed muscle.  This site has a poor prognosis for granulation and closure potential. Very painful when touched. Measurement: 24X13X.3cm Drainage (amount, consistency, odor) Mod amt red-tinged drainage, no odor. Periwound: Erythremia and bruising surrounding wound bed. Dressing procedure/placement/frequency: Applied steri strips to hold skin flaps over wound bed which have been re-approximated.  Mepitel contact layer applied over wound to protect and decrease discomfort and chance for dressing adherence to occur. This can remain in place and be changed 2X week. Abd pad applied over middle of wound to absorb drainage and non-adherent dressing to upper and lower exposed wound edges with daily dressing changes.  Recommend further plan of care from plastics team; please consult them if desired. This was also indicated in ortho progress notes. Pt may require a skin substitute be placed over exposed bone/muscle area on shin which is a difficult site for granulation and closure to occur. Will defer to this team for further plan of care. Please re-consult if further assistance is needed.  Thank-you,  Cammie Mcgee MSN, RN, CWOCN, [place], CNS [phone]

## 2013-08-24 NOTE — Consult Note (Signed)
ORTHOPAEDIC CONSULTATION  REQUESTING PHYSICIAN: Edsel Petrin, DO  Chief Complaint: left fibula fracture  HPI: Shirley Strickland is a 79 y.o. female who complains of  Multiple falls with Pain at her left fibula and bilateral ankles. She also has several hematomas and skin abrasions on her pre-tibial region  Past Medical History  Diagnosis Date  . 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 Cancer     pt states only skin cancer  . Numbness of legs     started after fall yesterday 07/03/11  . Bruise     bruise on left foot from fall 07/03/11  . Knee pain, acute 07/04/11    Pt states left knee twisted under her in fall 07/03/11 and now painful  . Rash and nonspecific skin eruption 07/04/11    rash on bilateral upper legs  . Syncope   . Fall   . HTN (hypertension)   . A-fib    Past Surgical History  Procedure Laterality Date  . Elbow surgery      right  . Appendectomy    . Exploratory laparotomy  07/04/11    years ago to find cause of bleeding   History   Social History  . Marital Status: Widowed    Spouse Name: N/A    Number of Children: N/A  . Years of Education: N/A   Social History Main Topics  . Smoking status: Never Smoker   . Smokeless tobacco: Never Used  . Alcohol Use: 1.2 oz/week    2 Cans of beer per week     Comment: 2 cans beer per day  . Drug Use: Yes  . Sexual Activity: No   Other Topics Concern  . None   Social History Narrative  . None   History reviewed. No pertinent family history. Allergies  Allergen Reactions  . Clarithromycin Nausea Only  . [employer] Allergy] Other (See Comments)    Stomach pains and passed out recently - doesn't remember reaction before this weekend  . Codeine Nausea And Vomiting  . Darvocet [Propoxyphene N-Acetaminophen] Nausea And Vomiting  . Demadex [Torsemide] Hives and Nausea And Vomiting    UNKNOWN  . Diovan Hct [Valsartan-Hydrochlorothiazide] Other (See Comments)    unknown  . Eggs Or Egg-Derived Products  Nausea And Vomiting  . Ex-Lax [Senna] Other (See Comments)    unknown  . Ibuprofen Nausea Only  . Indanediones Other (See Comments)    unknown  . Indapamide Other (See Comments)    unknown  . Morphine And Related Nausea And Vomiting  . Sulfa Antibiotics Other (See Comments)    Couldn't breathe  . Benzonatate Itching and Rash    Eyes swelled  . Penicillins Rash  . Septra [Bactrim] Rash   Prior to Admission medications   Medication Sig Start Date End Date Taking? Authorizing Provider  calcium-vitamin D (OSCAL WITH D) 500-200 MG-UNIT per tablet Take 2 tablets by mouth daily.   Yes Historical Provider, MD  cholecalciferol (VITAMIN D) 1000 UNITS tablet Take 1,000 Units by mouth daily.    Yes Historical Provider, MD  Docusate Calcium (STOOL SOFTENER PO) Take 1 tablet by mouth at bedtime as needed (constipation).   Yes Historical Provider, MD  loteprednol (LOTEMAX) 0.5 % ophthalmic suspension Place 1 drop into both eyes daily as needed (itching).   Yes Historical Provider, MD  Polyethyl Glycol-Propyl Glycol (SYSTANE OP) Place 1 drop into both eyes daily.   Yes Historical Provider, MD  predniSONE (DELTASONE) 20 MG  tablet Take 20 mg by mouth See admin instructions. Take 1 tablet (20 mg) twice daily for 4 days, then take 1 tablet (20 mg) daily for 4 days   Yes Historical Provider, MD  telmisartan (MICARDIS) 80 MG tablet Take 80 mg by mouth daily.     Yes Historical Provider, MD   Dg Tibia/fibula Left  08/23/2013   CLINICAL DATA:  [place] today, with pain  EXAM: LEFT TIBIA AND FIBULA - 2 VIEW  COMPARISON:  None.  FINDINGS: There is mild deformity of the neck of the fibula. There appears to be callus formation in this area. An acute fracture line is not appreciated.  IMPRESSION: Mild deformity of the neck of the fibula with evidence of periosteal reaction. This could be due to prior fracture with interval healing. If there is point tenderness corresponding to this area, an acute fracture should be  suspected.   Electronically Signed   By: Esperanza Heir M.D.   On: 08/23/2013 21:52   Dg Ankle Complete Left  08/24/2013   CLINICAL DATA:  Left ankle pain and lower is regular Fall.  EXAM: LEFT ANKLE COMPLETE - 3+ VIEW  COMPARISON:  Tibia and fibula 08/23/2013.  Left ankle 07/04/2011.  FINDINGS: Soft tissue lacerations demonstrated over the lateral aspect of the distal left lower leg. Diffuse bone demineralization. No displaced fractures demonstrated in the left ankle. There is suggestion of very fracture or deformity of the 5th metatarsal bone. This likely represents old fracture deformity, in comparison to previous left foot radiograph from 07/04/2011.  IMPRESSION: Old metatarsal fracture deformity. Soft tissue lacerations. Diffuse bone demineralization. No acute fracture demonstrated in the left ankle.   Electronically Signed   By: Burman Nieves M.D.   On: 08/24/2013 01:26   Dg Ankle Complete Right  08/23/2013   CLINICAL DATA:  Fall with right ankle pain  EXAM: RIGHT ANKLE - COMPLETE 3+ VIEW  COMPARISON:  None.  FINDINGS: Diffuse osteopenia. Subtle lucency anterior tibial distal epiphysis likely a nutrient foramen. No fracture or joint effusion. Mortise is intact.  IMPRESSION: No acute traumatic osseous abnormality, although would recommend follow up imaging if symptoms persist in one week given diffuse osteopenia.   Electronically Signed   By: Esperanza Heir M.D.   On: 08/23/2013 21:56    Positive ROS: All other systems have been reviewed and were otherwise negative with the exception of those mentioned in the HPI and as above.  Labs cbc  Recent Labs  08/23/13 2008  WBC 13.2*  HGB 10.1*  HCT 29.7*  PLT 176    Labs inflam No results found for this basename: ESR, CRP,  in the last 72 hours  Labs coag No results found for this basename: INR, PT, PTT,  in the last 72 hours   Recent Labs  08/23/13 2008  NA 127*  K 4.5  CL 90*  CO2 23  GLUCOSE 159*  BUN 27*  CREATININE  1.19*  CALCIUM 10.1    Physical Exam: Filed Vitals:   08/23/13 2354  BP: 167/70  Pulse: 101  Temp: 99 F (37.2 C)  Resp: 20   General: Alert, no acute distress Cardiovascular: No pedal edema Respiratory: No cyanosis, no use of accessory musculature GI: No organomegaly, abdomen is soft and non-tender Skin: No lesions in the area of chief complaint Neurologic: Sensation intact distally Psychiatric: Patient is competent for consent with normal mood and affect Lymphatic: No axillary or cervical lymphadenopathy  MUSCULOSKELETAL:  RLE: joint ROM is intact,  SILT  DP/SP/S/S/T nerve, 2+ DP, +TA/GS/EHL Compartments soft Large pre-tibial hematoma at risk for full thickness loss  LLE: TTP at proximal fibulat SILT DP/SP/S/S/T nerve, 2+ DP, +TA/GS/EHL Compartments soft No Crepitous Large wound on the pre-tibial region with some full thickness dermal loss.   Other extremities are atraumatic with painless ROM and NVI.  Assessment: Left fibular fracture. Bilateral pre-tibial wounds  Plan: Non-op for Fibula fracture, no bracing needed Recommend Plastic surgery consult for wound management Weight Bearing Status: WBAT PT VTE px: SCD's and chemical per the primary team   Margarita Rana, D, MD Cell [phone]   08/24/2013 1:29 AM

## 2013-08-24 NOTE — Progress Notes (Signed)
Clinical Social Work Department BRIEF PSYCHOSOCIAL ASSESSMENT 08/24/2013  Patient:  LANECIA, SLIVA     Account Number:  [id number]     Admit date:  08/23/2013  Clinical Social Worker:  Hendricks Milo  Date/Time:  08/24/2013 11:47 AM  Referred by:  Physician  Date Referred:  08/24/2013 Referred for  SNF Placement   Other Referral:   Interview type:  Patient Other interview type:    PSYCHOSOCIAL DATA Living Status:  ALONE Admitted from facility:   Level of care:   Primary support name:  Dennie Bible [phone] Primary support relationship to patient:  FAMILY Degree of support available:   Dennie Bible is patient's sister-in-law and POA. She is very supportive and was a bedside during the assessment.    CURRENT CONCERNS  Other Concerns:    SOCIAL WORK ASSESSMENT / PLAN Clinical Child psychotherapist (CSW) met with patient, sister-in-law Higinio Plan and niece Malachy Mood. Patient was agreeable to short term rehab in a skilled nursing facility. Patient's first choice is [facility]. Patient reported that she understands she needs rehab and knows she will eventually have to go live somewhere long term. Sister-in-law and niece are also agreeable to SNF search and reported that they would help the patient make a decision about a facility.   Assessment/plan status:  Psychosocial Support/Ongoing Assessment of Needs Other assessment/ plan:   Information/referral to community resources:   CSW gave sister-in-law SNF list.    PATIENT'S/FAMILY'S RESPONSE TO PLAN OF CARE: Patient, sister-in-law, and niece all thanked CSW.

## 2013-08-24 NOTE — Progress Notes (Addendum)
Clinical Social Work Department CLINICAL SOCIAL WORK PLACEMENT NOTE 08/24/2013  Patient:  Shirley Strickland, Shirley Strickland  Account Number:  [id number] Admit date:  08/23/2013  Clinical Social Worker:  Jetta Lout, [place]  Date/time:  08/24/2013 12:07 PM  Clinical Social Work is seeking post-discharge placement for this patient at the following level of care:   SKILLED NURSING   (*CSW will update this form in Epic as items are completed)   08/24/2013  Patient/family provided with [facility] Department of Clinical Social Work's list of facilities offering this level of care within the geographic area requested by the patient (or if unable, by the patient's family).  08/24/2013  Patient/family informed of their freedom to choose among providers that offer the needed level of care, that participate in Medicare, Medicaid or managed care program needed by the patient, have an available bed and are willing to accept the patient.  08/24/2013  Patient/family informed of [facility]' ownership interest in [facility], as well as of the fact that they are under no obligation to receive care at this facility.  PASARR submitted to EDS on 07/09/2011 PASARR number received from [employer] on 07/10/2011  FL2 transmitted to all facilities in geographic area requested by pt/family on  08/24/2013 FL2 transmitted to all facilities within larger geographic area on   Patient informed that his/her managed care company has contracts with or will negotiate with  certain facilities, including the following:     Patient/family informed of bed offers received: 08/26/13  Patient chooses bed at [facility] Physician recommends and patient chooses bed at    Patient to be transferred to [facility] on 08/29/13   Patient to be transferred to facility by ambulance  The following physician request were entered in Epic:   Additional Comments:

## 2013-08-24 NOTE — Progress Notes (Signed)
Utilization review completed.  

## 2013-08-25 DIAGNOSIS — I059 Rheumatic mitral valve disease, unspecified: Secondary | ICD-10-CM

## 2013-08-25 DIAGNOSIS — I5032 Chronic diastolic (congestive) heart failure: Secondary | ICD-10-CM

## 2013-08-25 DIAGNOSIS — S81809A Unspecified open wound, unspecified lower leg, initial encounter: Secondary | ICD-10-CM

## 2013-08-25 LAB — CBC
HCT: 24.4 % — ABNORMAL LOW (ref 36.0–46.0)
Hemoglobin: 8.2 g/dL — ABNORMAL LOW (ref 12.0–15.0)
MCH: 29 pg (ref 26.0–34.0)
MCHC: 33.6 g/dL (ref 30.0–36.0)
MCV: 86.2 fL (ref 78.0–100.0)
Platelets: 168 10*3/uL (ref 150–400)
RBC: 2.83 MIL/uL — ABNORMAL LOW (ref 3.87–5.11)
RDW: 13.2 % (ref 11.5–15.5)
WBC: 10.6 10*3/uL — ABNORMAL HIGH (ref 4.0–10.5)

## 2013-08-25 LAB — URINE CULTURE: Colony Count: >=100000

## 2013-08-25 LAB — BASIC METABOLIC PANEL
BUN: 26 mg/dL — ABNORMAL HIGH (ref 6–23)
CO2: 23 mEq/L (ref 19–32)
Calcium: 8.8 mg/dL (ref 8.4–10.5)
Chloride: 100 mEq/L (ref 96–112)
Creatinine, Ser: 0.95 mg/dL (ref 0.50–1.10)
GFR calc Af Amer: 57 mL/min — ABNORMAL LOW (ref >90)
GFR calc non Af Amer: 49 mL/min — ABNORMAL LOW (ref >90)
Glucose, Bld: 162 mg/dL — ABNORMAL HIGH (ref 70–99)
Potassium: 5 mEq/L (ref 3.7–5.3)
Sodium: 132 mEq/L — ABNORMAL LOW (ref 137–147)

## 2013-08-25 LAB — PRO B NATRIURETIC PEPTIDE: Pro B Natriuretic peptide (BNP): 3100 pg/mL — ABNORMAL HIGH (ref 0–450)

## 2013-08-25 MED ORDER — FUROSEMIDE 10 MG/ML IJ SOLN
40.0000 mg | Freq: Two times a day (BID) | INTRAMUSCULAR | Status: DC
  Administered 2013-08-25 – 2013-08-26 (×2): 40 mg via INTRAVENOUS
  Filled 2013-08-25 (×4): qty 4

## 2013-08-25 NOTE — Progress Notes (Addendum)
[facility] PROGRESS NOTE  Shirley Strickland [id number] DOB: 01/29/1916 DOA: 08/23/2013 PCP: [name], MD  HPI/Subjective: Daughter at bedside, no complains about pain.  Assessment/Plan: Principal Problem:   Left fibular fracture Active Problems:   Fall   A-fib   Laceration of left leg   Syncope and collapse   HTN (hypertension)   Fall at home   Chronic diastolic heart failure   Syncope and fall -An unwitnessed falls at home, this was thought to be syncopal from before. -Came in to the hospital with laceration of her left leg. -2-D echocardiogram-done, waiting for results although suspect diastolic dysfunction as etiology given markedly elevated BNP,  12-lead EKG and telemetry-unremarkable -Rule out acute coronary syndrome-troponin initially unremarkable. Repeat troponin tomorrow morning  Suspected acute diastolic heart failure: Placed IV Foley and started IV Lasix.  Left leg laceration and fibula fracture -Seen by orthopedics, recommended plastic surgery consultation.-Plus protocol because holidays, will try again 1/2. In the meantime, have consulted wound care -Per orthopedics this is nonoperative fracture.  Hyponatremia -Chronic hyponatremia, sodium 127. Initially, better with hydration. -Obtain urine osmole, plasma all small, urine sodium and creatinine.  Atrial fibrillation -Obviously history of atrial fibrillation as her rhythm as sinus rhythm now. -She is not on anticoagulation as outpatient.  Addendum -I called the operator and the ER, there is no plastic and reconstructive surgeon on call.-Have consulted wound care  Code Status: Full code Family Communication: Plan discussed with the patient. Disposition Plan: Remains inpatient   Consultants:  Dr. Percell Miller  Procedures:  None  Antibiotics:  None   Objective: Filed Vitals:   08/25/13 1724  BP: 175/87  Pulse: 89  Temp: 98.9 F (37.2 C)  Resp: 18    Intake/Output Summary  (Last 24 hours) at 08/25/13 1808 Last data filed at 08/25/13 1330  Gross per 24 hour  Intake 1757.5 ml  Output    250 ml  Net 1507.5 ml   Filed Weights   08/23/13 1919 08/24/13 2204  Weight: 61.689 kg (136 lb) 62.279 kg (137 lb 4.8 oz)    Exam: General: Alert and awake, oriented x3, not in any acute distress. CVS: S1-S2 clear, soft 2/6 systolic ejection murmur Lungs: clear to auscultation bilaterally, no wheezing, rales or rhonchi Abdomen: soft nontender, nondistended, normal bowel sounds, no organomegaly Extremities: no cyanosis, clubbing, trace edema. Right lower extremity quite bruised on the anterior aspect. Left lower extremity bandaged   Data Reviewed: Basic Metabolic Panel:  Recent Labs Lab 08/23/13 2008 08/24/13 0157 08/25/13 0427  NA 127*  --  132*  K 4.5  --  5.0  CL 90*  --  100  CO2 23  --  23  GLUCOSE 159*  --  162*  BUN 27*  --  26*  CREATININE 1.19* 1.05 0.95  CALCIUM 10.1  --  8.8   Liver Function Tests:  Recent Labs Lab 08/23/13 2008  AST 15  ALT 10  ALKPHOS 46  BILITOT 0.6  PROT 6.2  ALBUMIN 3.0*   No results found for this basename: LIPASE, AMYLASE,  in the last 168 hours No results found for this basename: AMMONIA,  in the last 168 hours CBC:  Recent Labs Lab 08/23/13 2008 08/24/13 0157 08/25/13 0427  WBC 13.2* 14.1* 10.6*  NEUTROABS 12.1*  --   --   HGB 10.1* 8.7* 8.2*  HCT 29.7* 25.9* 24.4*  MCV 85.8 85.5 86.2  PLT 176 152 168   Cardiac Enzymes: No results found for this basename: CKTOTAL, CKMB,  CKMBINDEX, TROPONINI,  in the last 168 hours BNP (last 3 results)  Recent Labs  08/25/13 0427  PROBNP 3100.0*   CBG: No results found for this basename: GLUCAP,  in the last 168 hours  Micro Recent Results (from the past 240 hour(s))  URINE CULTURE     Status: None   Collection Time    08/24/13  5:16 AM      Result Value Range Status   Specimen Description URINE, RANDOM   Final   Special Requests NONE   Final   Culture   Setup Time     Final   Value: 08/24/2013 09:00     Performed at [place]     Final   Value: >=100,000 COLONIES/ML     Performed at [facility]   Culture     Final   Value: Multiple bacterial morphotypes present, none predominant. Suggest appropriate recollection if clinically indicated.     Performed at [facility]   Report Status 08/25/2013 FINAL   Final     Studies: Dg Tibia/fibula Left  08/23/2013   CLINICAL DATA:  [place] today, with pain  EXAM: LEFT TIBIA AND FIBULA - 2 VIEW  COMPARISON:  None.  FINDINGS: There is mild deformity of the neck of the fibula. There appears to be callus formation in this area. An acute fracture line is not appreciated.  IMPRESSION: Mild deformity of the neck of the fibula with evidence of periosteal reaction. This could be due to prior fracture with interval healing. If there is point tenderness corresponding to this area, an acute fracture should be suspected.   Electronically Signed   By: Skipper Cliche M.D.   On: 08/23/2013 21:52   Dg Ankle Complete Left  08/24/2013   CLINICAL DATA:  Left ankle pain and lower is regular Fall.  EXAM: LEFT ANKLE COMPLETE - 3+ VIEW  COMPARISON:  Tibia and fibula 08/23/2013.  Left ankle 07/04/2011.  FINDINGS: Soft tissue lacerations demonstrated over the lateral aspect of the distal left lower leg. Diffuse bone demineralization. No displaced fractures demonstrated in the left ankle. There is suggestion of very fracture or deformity of the 5th metatarsal bone. This likely represents old fracture deformity, in comparison to previous left foot radiograph from 07/04/2011.  IMPRESSION: Old metatarsal fracture deformity. Soft tissue lacerations. Diffuse bone demineralization. No acute fracture demonstrated in the left ankle.   Electronically Signed   By: Lucienne Capers M.D.   On: 08/24/2013 01:26   Dg Ankle Complete Right  08/23/2013   CLINICAL DATA:  Fall with right ankle pain  EXAM:  RIGHT ANKLE - COMPLETE 3+ VIEW  COMPARISON:  None.  FINDINGS: Diffuse osteopenia. Subtle lucency anterior tibial distal epiphysis likely a nutrient foramen. No fracture or joint effusion. Mortise is intact.  IMPRESSION: No acute traumatic osseous abnormality, although would recommend follow up imaging if symptoms persist in one week given diffuse osteopenia.   Electronically Signed   By: Skipper Cliche M.D.   On: 08/23/2013 21:56    Scheduled Meds: . calcium-vitamin D  2 tablet Oral Daily  . cholecalciferol  1,000 Units Oral Daily  . docusate sodium  100 mg Oral BID  . furosemide  40 mg Intravenous BID  . heparin  5,000 Units Subcutaneous Q8H  . irbesartan  300 mg Oral Daily  . polyvinyl alcohol  1 drop Both Eyes Daily  . predniSONE  20 mg Oral Q breakfast   Continuous Infusions:  Time spent: 25 minutes    [facility] Pager [phone] If 7PM-7AM, please contact night-coverage at [email or web address], password [facility] 08/25/2013, 6:08 PM  LOS: 2 days

## 2013-08-25 NOTE — Progress Notes (Signed)
Echocardiogram 2D Echocardiogram has been performed.  Joelene Millin 08/25/2013, 3:50 PM

## 2013-08-26 ENCOUNTER — Encounter (HOSPITAL_COMMUNITY): Payer: Self-pay | Admitting: Emergency Medicine

## 2013-08-26 LAB — CBC
HCT: 25.8 % — ABNORMAL LOW (ref 36.0–46.0)
HEMOGLOBIN: 8.8 g/dL — AB (ref 12.0–15.0)
MCH: 29.1 pg (ref 26.0–34.0)
MCHC: 34.1 g/dL (ref 30.0–36.0)
MCV: 85.4 fL (ref 78.0–100.0)
Platelets: 229 10*3/uL (ref 150–400)
RBC: 3.02 MIL/uL — ABNORMAL LOW (ref 3.87–5.11)
RDW: 13.2 % (ref 11.5–15.5)
WBC: 13 10*3/uL — ABNORMAL HIGH (ref 4.0–10.5)

## 2013-08-26 LAB — URINE MICROSCOPIC-ADD ON

## 2013-08-26 LAB — URINALYSIS, ROUTINE W REFLEX MICROSCOPIC
Bilirubin Urine: NEGATIVE
GLUCOSE, UA: NEGATIVE mg/dL
Ketones, ur: NEGATIVE mg/dL
Nitrite: NEGATIVE
PROTEIN: NEGATIVE mg/dL
Specific Gravity, Urine: 1.013 (ref 1.005–1.030)
Urobilinogen, UA: 0.2 mg/dL (ref 0.0–1.0)
pH: 6.5 (ref 5.0–8.0)

## 2013-08-26 LAB — BASIC METABOLIC PANEL
BUN: 25 mg/dL — ABNORMAL HIGH (ref 6–23)
CO2: 27 mEq/L (ref 19–32)
Calcium: 8.9 mg/dL (ref 8.4–10.5)
Chloride: 98 mEq/L (ref 96–112)
Creatinine, Ser: 0.92 mg/dL (ref 0.50–1.10)
GFR calc Af Amer: 59 mL/min — ABNORMAL LOW (ref >90)
GFR calc non Af Amer: 51 mL/min — ABNORMAL LOW (ref >90)
GLUCOSE: 100 mg/dL — AB (ref 70–99)
POTASSIUM: 3.9 meq/L (ref 3.7–5.3)
Sodium: 135 mEq/L — ABNORMAL LOW (ref 137–147)

## 2013-08-26 MED ORDER — AMLODIPINE BESYLATE 5 MG PO TABS
5.0000 mg | ORAL_TABLET | Freq: Every day | ORAL | Status: DC
  Administered 2013-08-26 – 2013-08-28 (×3): 5 mg via ORAL
  Filled 2013-08-26 (×3): qty 1

## 2013-08-26 MED ORDER — FUROSEMIDE 20 MG PO TABS
10.0000 mg | ORAL_TABLET | Freq: Every day | ORAL | Status: DC
  Administered 2013-08-27 – 2013-08-29 (×3): 10 mg via ORAL
  Filled 2013-08-26 (×3): qty 0.5

## 2013-08-26 NOTE — Consult Note (Addendum)
WOC consult requested again; this was already performed on 12/31 for left leg full thickness laceration.  Please refer to previous progress notes for assessment and plan of care until plastics consult available, which is pending according to primary team progress notes from yesterday. Please re-consult if further assistance is needed.  Thank-you,  Julien Girt MSN, [place], [place], [place], [place]

## 2013-08-26 NOTE — Progress Notes (Signed)
[facility] PROGRESS NOTE  MAURISHA MONGEAU [id number] DOB: 23-Feb-1916 DOA: 08/23/2013 PCP: [name], MD  Assessment/Plan: Principal Problem:  Left fibular fracture  Active Problems:  Fall  A-fib  Laceration of left leg  Syncope and collapse  HTN (hypertension)  Fall at home  Chronic diastolic heart failure   1. Syncope and fall; neuro exam no focal  -An unwitnessed falls at home, this was thought to be syncopal from before.  -Came in to the hospital with laceration of her left leg.  -12-lead EKG and telemetry-unremarkable  -repeat UA tr/o uti; remove foley   2. Suspected acute diastolic heart failure: echo : LVEF 55%, could not evaluated diastolic function   -Improved on IV Lasix. Lung clear, no more evidence of HF; changed to PO diuretics   3. Left leg laceration and fibula fracture  -Seen by orthopedics, recommended plastic surgery consultation. have consulted wound care  -Per orthopedics this is nonoperative fracture. -d/w plastics who kindly agreed to see patient   4. Hyponatremia  -Chronic hyponatremia, sodium 127. Initially, better with hydration.  -Obtain urine osmole, plasma all small, urine sodium and creatinine.   5. Atrial fibrillation  -Obviously history of atrial fibrillation as her rhythm as sinus rhythm now.  -She is not on anticoagulation as outpatient due to fall risk   6. HTN uncontrolled; added amlodipine    Code Status: full Family Communication: d/w patient  (indicate person spoken with, relationship, and if by phone, the number) Disposition Plan: snf    Consultants:  Ortho   Procedures:  None   Antibiotics:  None  (indicate start date, and stop date if known)  HPI/Subjective: alert  Objective: Filed Vitals:   08/26/13 1354  BP: 164/68  Pulse: 89  Temp: 98 F (36.7 C)  Resp: 17    Intake/Output Summary (Last 24 hours) at 08/26/13 1502 Last data filed at 08/26/13 1355  Gross per 24 hour  Intake    420 ml   Output   4750 ml  Net  -4330 ml   Filed Weights   08/23/13 1919 08/24/13 2204 08/25/13 2055  Weight: 61.689 kg (136 lb) 62.279 kg (137 lb 4.8 oz) 64.638 kg (142 lb 8 oz)    Exam:   General:  alert  Cardiovascular: s1,s2 rrr  Respiratory: CTA BL  Abdomen: soft, nt, nd   Musculoskeletal: LE laceration, hematoma    Data Reviewed: Basic Metabolic Panel:  Recent Labs Lab 08/23/13 2008 08/24/13 0157 08/25/13 0427 08/26/13 0547  NA 127*  --  132* 135*  K 4.5  --  5.0 3.9  CL 90*  --  100 98  CO2 23  --  23 27  GLUCOSE 159*  --  162* 100*  BUN 27*  --  26* 25*  CREATININE 1.19* 1.05 0.95 0.92  CALCIUM 10.1  --  8.8 8.9   Liver Function Tests:  Recent Labs Lab 08/23/13 2008  AST 15  ALT 10  ALKPHOS 46  BILITOT 0.6  PROT 6.2  ALBUMIN 3.0*   No results found for this basename: LIPASE, AMYLASE,  in the last 168 hours No results found for this basename: AMMONIA,  in the last 168 hours CBC:  Recent Labs Lab 08/23/13 2008 08/24/13 0157 08/25/13 0427 08/26/13 0547  WBC 13.2* 14.1* 10.6* 13.0*  NEUTROABS 12.1*  --   --   --   HGB 10.1* 8.7* 8.2* 8.8*  HCT 29.7* 25.9* 24.4* 25.8*  MCV 85.8 85.5 86.2 85.4  PLT 176 152  168 229   Cardiac Enzymes: No results found for this basename: CKTOTAL, CKMB, CKMBINDEX, TROPONINI,  in the last 168 hours BNP (last 3 results)  Recent Labs  08/25/13 0427  PROBNP 3100.0*   CBG: No results found for this basename: GLUCAP,  in the last 168 hours  Recent Results (from the past 240 hour(s))  URINE CULTURE     Status: None   Collection Time    08/24/13  5:16 AM      Result Value Range Status   Specimen Description URINE, RANDOM   Final   Special Requests NONE   Final   Culture  Setup Time     Final   Value: 08/24/2013 09:00     Performed at [place]     Final   Value: >=100,000 COLONIES/ML     Performed at [facility]   Culture     Final   Value: Multiple bacterial morphotypes  present, none predominant. Suggest appropriate recollection if clinically indicated.     Performed at [facility]   Report Status 08/25/2013 FINAL   Final     Studies: No results found.  Scheduled Meds: . calcium-vitamin D  2 tablet Oral Daily  . cholecalciferol  1,000 Units Oral Daily  . docusate sodium  100 mg Oral BID  . furosemide  40 mg Intravenous BID  . heparin  5,000 Units Subcutaneous Q8H  . irbesartan  300 mg Oral Daily  . polyvinyl alcohol  1 drop Both Eyes Daily  . predniSONE  20 mg Oral Q breakfast   Continuous Infusions:   Principal Problem:   Left fibular fracture Active Problems:   Fall   A-fib   Laceration of left leg   Syncope and collapse   HTN (hypertension)   Fall at home   Chronic diastolic heart failure    Time spent: >35 minutes     Kinnie Feil  [facility] Pager [phone]. If 7PM-7AM, please contact night-coverage at [email or web address], password [facility] 08/26/2013, 3:02 PM  LOS: 3 days

## 2013-08-26 NOTE — Progress Notes (Signed)
08/26/2013 patient foley was removed at 1700. She had 250cc of clear yellow urine.Rico Sheehan RN

## 2013-08-26 NOTE — Consult Note (Signed)
Date of Consult 08/26/12 Location: [place]  Reason for Consult:open wound left leg Referring Physician: Drs. Andrena, Margerum is an 80 y.o. female.  HPI: Independent living female with fall 08/23/13. Suffered left fibula fracture, closed. No operative treatment needed, WBAT. Patient has pain over R ankle (old injuries noted) and left leg. Patient agrees she should not be living alone at this time and plan presently for SNF.   PMH significant for atrial fibrillation (no anticoagulation), skin cancer, anemia, hypertension    Past Surgical History  Procedure Laterality Date  . Elbow surgery      right  . Appendectomy    . Exploratory laparotomy  07/04/11    years ago to find cause of bleeding    History reviewed. No pertinent family history.  Social History:  reports that she has never smoked. She has never used smokeless tobacco. She reports that she drinks about 1.2 ounces of alcohol per week. She reports that she uses illicit drugs.  Allergies:  Allergies  Allergen Reactions  . Clarithromycin Nausea Only  . [employer] Allergy] Other (See Comments)    Stomach pains and passed out recently - doesn't remember reaction before this weekend  . Codeine Nausea And Vomiting  . Darvocet [Propoxyphene N-Acetaminophen] Nausea And Vomiting  . Demadex [Torsemide] Hives and Nausea And Vomiting    UNKNOWN  . Diovan Hct [Valsartan-Hydrochlorothiazide] Other (See Comments)    unknown  . Eggs Or Egg-Derived Products Nausea And Vomiting  . Ex-Lax [Senna] Other (See Comments)    unknown  . Ibuprofen Nausea Only  . Indanediones Other (See Comments)    unknown  . Indapamide Other (See Comments)    unknown  . Morphine And Related Nausea And Vomiting  . Sulfa Antibiotics Other (See Comments)    Couldn't breathe  . Benzonatate Itching and Rash    Eyes swelled  . Penicillins Rash  . Septra [Bactrim] Rash    Medications: I have reviewed the patient's current  medications.  Labs reviewed, significant for Hb 8.8 ROS Blood pressure 164/68, pulse 89, temperature 98 F (36.7 C), temperature source Oral, resp. rate 17, height 5\' 6"  (1.676 m), weight 64.638 kg (142 lb 8 oz), SpO2 96.00%. Physical Exam Alert, oriented RLE with pitting edema, ecchymoses and TTP, no open area LLE with stellate avulsion skin anterior tibial surface with ecchymoses. Avulsed areas have been tacked down with steris. With ecchymotic thin skin, loss soft tissue over anterior tibialis muscle and additional areas of SQ fat exposed. Wound is clean, brisk bleeding of tissue  Assessment/Plan: Agree with wound care as ordered while she remains inpatient. No antibiotics needed.  Upon SNF d/c, recommend new wound care orders with Promogran matrix to open wound. Cover with Mepilex or Adaptic and cover with dry dressing three times weekly.  Contact information provided to patient and family. Please arrange f/u with me in office within week of discharge.  Discussed that she may benefit from procedures in future to aid with wound care, such as A Cell application. Do not feel skin graft/auto graft will be well tolerated in this patient.   Irene Limbo, MD [facility] [phone]   Irene Limbo 08/26/2013, 4:31 PM

## 2013-08-27 DIAGNOSIS — N390 Urinary tract infection, site not specified: Secondary | ICD-10-CM

## 2013-08-27 LAB — BASIC METABOLIC PANEL
BUN: 29 mg/dL — ABNORMAL HIGH (ref 6–23)
CALCIUM: 9.3 mg/dL (ref 8.4–10.5)
CO2: 30 meq/L (ref 19–32)
Chloride: 95 mEq/L — ABNORMAL LOW (ref 96–112)
Creatinine, Ser: 0.91 mg/dL (ref 0.50–1.10)
GFR calc Af Amer: 60 mL/min — ABNORMAL LOW (ref >90)
GFR calc non Af Amer: 52 mL/min — ABNORMAL LOW (ref >90)
GLUCOSE: 99 mg/dL (ref 70–99)
Potassium: 4.1 mEq/L (ref 3.7–5.3)
Sodium: 135 mEq/L — ABNORMAL LOW (ref 137–147)

## 2013-08-27 MED ORDER — HYDRALAZINE HCL 20 MG/ML IJ SOLN
INTRAMUSCULAR | Status: AC
  Administered 2013-08-27: 15 mg
  Filled 2013-08-27: qty 1

## 2013-08-27 MED ORDER — LEVOFLOXACIN IN D5W 250 MG/50ML IV SOLN
250.0000 mg | INTRAVENOUS | Status: DC
  Administered 2013-08-27 – 2013-08-28 (×2): 250 mg via INTRAVENOUS
  Filled 2013-08-27 (×4): qty 50

## 2013-08-27 MED ORDER — HYDRALAZINE HCL 20 MG/ML IJ SOLN
5.0000 mg | Freq: Four times a day (QID) | INTRAMUSCULAR | Status: DC | PRN
  Administered 2013-08-27: 5 mg via INTRAVENOUS
  Filled 2013-08-27 (×2): qty 1

## 2013-08-27 MED ORDER — LEVOFLOXACIN IN D5W 500 MG/100ML IV SOLN: 500.0000 mg | INTRAVENOUS | Status: DC

## 2013-08-27 NOTE — Progress Notes (Signed)
[facility] PROGRESS NOTE  KEYETTA HOLLINGWORTH QBH:[id number] DOB: 10/09/1915 DOA: 08/23/2013 PCP: [name], MD  Assessment/Plan: Principal Problem:  Left fibular fracture  Active Problems:  Fall  A-fib  Laceration of left leg  Syncope and collapse  HTN (hypertension)  Fall at home  Chronic diastolic heart failure   1. Syncope and fall; neuro exam no focal; probable uti related  -An unwitnessed falls at home, this was thought to be syncopal from before.  -Came in to the hospital with laceration of her left leg.  -12-lead EKG and telemetry-unremarkable   2. Suspected acute diastolic heart failure: echo : LVEF 55%, could not evaluated diastolic function   -Improved on IV Lasix. Lungs are clear, no more evidence of HF; changed to PO diuretics   3. Left leg laceration and fibula fracture  -Seen by orthopedics, recommended plastic surgery consultation. have consulted wound care  -Per orthopedics this is nonoperative fracture. -per plastics: "Upon SNF d/c, recommend new wound care orders with Promogran matrix to open wound. Cover with Mepilex or Adaptic and cover with dry dressing three times weekly. Do not feel skin graft/auto graft will be well tolerated in this patient"  4. Hyponatremia -Chronic hyponatremia, sodium 127.Marland Kitchen Resolved   5. Atrial fibrillation  -Obviously history of atrial fibrillation as her rhythm as sinus rhythm now.  -She is not on anticoagulation as outpatient due to fall risk   6. HTN uncontrolled; added amlodipine   7. Probable UTI; terat with empiric atx 3 days   Code Status: full Family Communication: d/w patient  (indicate person spoken with, relationship, and if by phone, the number) Disposition Plan: snf awaiting    Consultants:  Ortho   Procedures:  None   Antibiotics:  levofloxacin 08/27/13>>>>  HPI/Subjective: alert  Objective: Filed Vitals:   08/27/13 1300  BP: 146/83  Pulse: 99  Temp: 97.8 F (36.6 C)  Resp: 20     Intake/Output Summary (Last 24 hours) at 08/27/13 1335 Last data filed at 08/27/13 0706  Gross per 24 hour  Intake    200 ml  Output   2450 ml  Net  -2250 ml   Filed Weights   08/24/13 2204 08/25/13 2055 08/26/13 2054  Weight: 62.279 kg (137 lb 4.8 oz) 64.638 kg (142 lb 8 oz) 64.7 kg (142 lb 10.2 oz)    Exam:   General:  alert  Cardiovascular: s1,s2 rrr  Respiratory: CTA BL  Abdomen: soft, nt, nd   Musculoskeletal: LE laceration, hematoma    Data Reviewed: Basic Metabolic Panel:  Recent Labs Lab 08/23/13 2008 08/24/13 0157 08/25/13 0427 08/26/13 0547 08/27/13 0450  NA 127*  --  132* 135* 135*  K 4.5  --  5.0 3.9 4.1  CL 90*  --  100 98 95*  CO2 23  --  23 27 30   GLUCOSE 159*  --  162* 100* 99  BUN 27*  --  26* 25* 29*  CREATININE 1.19* 1.05 0.95 0.92 0.91  CALCIUM 10.1  --  8.8 8.9 9.3   Liver Function Tests:  Recent Labs Lab 08/23/13 2008  AST 15  ALT 10  ALKPHOS 46  BILITOT 0.6  PROT 6.2  ALBUMIN 3.0*   No results found for this basename: LIPASE, AMYLASE,  in the last 168 hours No results found for this basename: AMMONIA,  in the last 168 hours CBC:  Recent Labs Lab 08/23/13 2008 08/24/13 0157 08/25/13 0427 08/26/13 0547  WBC 13.2* 14.1* 10.6* 13.0*  NEUTROABS 12.1*  --   --   --  HGB 10.1* 8.7* 8.2* 8.8*  HCT 29.7* 25.9* 24.4* 25.8*  MCV 85.8 85.5 86.2 85.4  PLT 176 152 168 229   Cardiac Enzymes: No results found for this basename: CKTOTAL, CKMB, CKMBINDEX, TROPONINI,  in the last 168 hours BNP (last 3 results)  Recent Labs  08/25/13 0427  PROBNP 3100.0*   CBG: No results found for this basename: GLUCAP,  in the last 168 hours  Recent Results (from the past 240 hour(s))  URINE CULTURE     Status: None   Collection Time    08/24/13  5:16 AM      Result Value Range Status   Specimen Description URINE, RANDOM   Final   Special Requests NONE   Final   Culture  Setup Time     Final   Value: 08/24/2013 09:00      Performed at [place]     Final   Value: >=100,000 COLONIES/ML     Performed at [facility]   Culture     Final   Value: Multiple bacterial morphotypes present, none predominant. Suggest appropriate recollection if clinically indicated.     Performed at [facility]   Report Status 08/25/2013 FINAL   Final     Studies: No results found.  Scheduled Meds: . amLODipine  5 mg Oral Daily  . calcium-vitamin D  2 tablet Oral Daily  . cholecalciferol  1,000 Units Oral Daily  . docusate sodium  100 mg Oral BID  . furosemide  10 mg Oral Daily  . heparin  5,000 Units Subcutaneous Q8H  . irbesartan  300 mg Oral Daily  . polyvinyl alcohol  1 drop Both Eyes Daily   Continuous Infusions:   Principal Problem:   Left fibular fracture Active Problems:   Fall   A-fib   Laceration of left leg   Syncope and collapse   HTN (hypertension)   Fall at home   Chronic diastolic heart failure    Time spent: >35 minutes     Kinnie Feil  [facility] Pager [phone]. If 7PM-7AM, please contact night-coverage at [email or web address], password [facility] 08/27/2013, 1:35 PM  LOS: 4 days

## 2013-08-28 LAB — BASIC METABOLIC PANEL
BUN: 30 mg/dL — ABNORMAL HIGH (ref 6–23)
CHLORIDE: 96 meq/L (ref 96–112)
CO2: 29 mEq/L (ref 19–32)
Calcium: 9.5 mg/dL (ref 8.4–10.5)
Creatinine, Ser: 1.17 mg/dL — ABNORMAL HIGH (ref 0.50–1.10)
GFR calc non Af Amer: 38 mL/min — ABNORMAL LOW (ref >90)
GFR, EST AFRICAN AMERICAN: 44 mL/min — AB (ref >90)
Glucose, Bld: 103 mg/dL — ABNORMAL HIGH (ref 70–99)
POTASSIUM: 4.4 meq/L (ref 3.7–5.3)
Sodium: 134 mEq/L — ABNORMAL LOW (ref 137–147)

## 2013-08-28 MED ORDER — METOPROLOL TARTRATE 12.5 MG HALF TABLET
12.5000 mg | ORAL_TABLET | Freq: Two times a day (BID) | ORAL | Status: DC
  Administered 2013-08-28 – 2013-08-29 (×3): 12.5 mg via ORAL
  Filled 2013-08-28 (×4): qty 1

## 2013-08-28 NOTE — Progress Notes (Signed)
[facility] PROGRESS NOTE  BRITANNI YARDE ZYS:[id number] DOB: 09/08/1915 DOA: 08/23/2013 PCP: [name], MD  Assessment/Plan: Principal Problem:  Left fibular fracture  Active Problems:  Fall  A-fib  Laceration of left leg  Syncope and collapse  HTN (hypertension)  Fall at home  Chronic diastolic heart failure   1. Syncope and fall; neuro exam no focal; probable uti related  -An unwitnessed falls at home, this was thought to be syncopal from before.  -Came in to the hospital with laceration of her left leg. Some SVT; start BB    2. Suspected acute diastolic heart failure: echo : LVEF 55%, could not evaluated diastolic function   -Improved on IV Lasix. Lungs are clear, no more evidence of HF; changed to PO diuretics   3. Left leg laceration and fibula fracture  -Seen by orthopedics, recommended plastic surgery consultation. have consulted wound care  -Per orthopedics this is nonoperative fracture. -per plastics: "Upon SNF d/c, recommend new wound care orders with Promogran matrix to open wound. Cover with Mepilex or Adaptic and cover with dry dressing three times weekly. Do not feel skin graft/auto graft will be well tolerated in this patient"  4. Hyponatremia -Chronic hyponatremia, sodium 127.Marland Kitchen Resolved   5. Atrial fibrillation  -Obviously history of atrial fibrillation as her rhythm as sinus rhythm now.  -She is not on anticoagulation as outpatient due to fall risk  -start BB due to arrythmia non sustained  6. HTN uncontrolled; added amlodipine   7. Probable UTI; terat with empiric atx 3 days   Code Status: full Family Communication: d/w patient  (indicate person spoken with, relationship, and if by phone, the number) Disposition Plan: snf awaiting    Consultants:  Ortho   Procedures:  None   Antibiotics:  levofloxacin 08/27/13>>>>  HPI/Subjective: alert  Objective: Filed Vitals:   08/28/13 1039  BP: 144/48  Pulse: 78  Temp:   Resp: 16     Intake/Output Summary (Last 24 hours) at 08/28/13 1125 Last data filed at 08/28/13 0815  Gross per 24 hour  Intake    210 ml  Output      0 ml  Net    210 ml   Filed Weights   08/24/13 2204 08/25/13 2055 08/26/13 2054  Weight: 62.279 kg (137 lb 4.8 oz) 64.638 kg (142 lb 8 oz) 64.7 kg (142 lb 10.2 oz)    Exam:   General:  alert  Cardiovascular: s1,s2 rrr  Respiratory: CTA BL  Abdomen: soft, nt, nd   Musculoskeletal: LE laceration, hematoma    Data Reviewed: Basic Metabolic Panel:  Recent Labs Lab 08/23/13 2008 08/24/13 0157 08/25/13 0427 08/26/13 0547 08/27/13 0450 08/28/13 0620  NA 127*  --  132* 135* 135* 134*  K 4.5  --  5.0 3.9 4.1 4.4  CL 90*  --  100 98 95* 96  CO2 23  --  23 27 30 29   GLUCOSE 159*  --  162* 100* 99 103*  BUN 27*  --  26* 25* 29* 30*  CREATININE 1.19* 1.05 0.95 0.92 0.91 1.17*  CALCIUM 10.1  --  8.8 8.9 9.3 9.5   Liver Function Tests:  Recent Labs Lab 08/23/13 2008  AST 15  ALT 10  ALKPHOS 46  BILITOT 0.6  PROT 6.2  ALBUMIN 3.0*   No results found for this basename: LIPASE, AMYLASE,  in the last 168 hours No results found for this basename: AMMONIA,  in the last 168 hours CBC:  Recent Labs  Lab 08/23/13 2008 08/24/13 0157 08/25/13 0427 08/26/13 0547  WBC 13.2* 14.1* 10.6* 13.0*  NEUTROABS 12.1*  --   --   --   HGB 10.1* 8.7* 8.2* 8.8*  HCT 29.7* 25.9* 24.4* 25.8*  MCV 85.8 85.5 86.2 85.4  PLT 176 152 168 229   Cardiac Enzymes: No results found for this basename: CKTOTAL, CKMB, CKMBINDEX, TROPONINI,  in the last 168 hours BNP (last 3 results)  Recent Labs  08/25/13 0427  PROBNP 3100.0*   CBG: No results found for this basename: GLUCAP,  in the last 168 hours  Recent Results (from the past 240 hour(s))  URINE CULTURE     Status: None   Collection Time    08/24/13  5:16 AM      Result Value Range Status   Specimen Description URINE, RANDOM   Final   Special Requests NONE   Final   Culture  Setup Time      Final   Value: 08/24/2013 09:00     Performed at [place]     Final   Value: >=100,000 COLONIES/ML     Performed at [facility]   Culture     Final   Value: Multiple bacterial morphotypes present, none predominant. Suggest appropriate recollection if clinically indicated.     Performed at [facility]   Report Status 08/25/2013 FINAL   Final     Studies: No results found.  Scheduled Meds: . calcium-vitamin D  2 tablet Oral Daily  . cholecalciferol  1,000 Units Oral Daily  . docusate sodium  100 mg Oral BID  . furosemide  10 mg Oral Daily  . heparin  5,000 Units Subcutaneous Q8H  . irbesartan  300 mg Oral Daily  . levofloxacin (LEVAQUIN) IV  250 mg Intravenous Q24H  . metoprolol tartrate  12.5 mg Oral BID  . polyvinyl alcohol  1 drop Both Eyes Daily   Continuous Infusions:   Principal Problem:   Left fibular fracture Active Problems:   Fall   A-fib   Laceration of left leg   Syncope and collapse   HTN (hypertension)   Fall at home   Chronic diastolic heart failure    Time spent: >35 minutes     Kinnie Feil  [facility] Pager [phone]. If 7PM-7AM, please contact night-coverage at [email or web address], password [facility] 08/28/2013, 11:25 AM  LOS: 5 days

## 2013-08-28 NOTE — Progress Notes (Signed)
Notified of SVT at 0700, 0730, and 0915. Pt denies pain or shortness of breath. BP stable. 130/60. HR 92 and irregular. Pt reports baseline chest tightness at times, but denise feeling this recently. Will continue to monitor. Manya Silvas, RN

## 2013-08-28 NOTE — Progress Notes (Signed)
Pt with 9 beats vtach. Asymptomatic.Talking on phone. Stated she may have been worked up about moving her things and selling her home, but not being able to be a part of the process. Denied pain or shortness of breath. BP 144/48 manually. HR 78 apical, irregular. Dr. Daleen Bo notified. Advised of pt reporting baseline chest tightness intermittently, and midsternal burning x 1 week. MD to f/u with pt. Continue to monitor. Manya Silvas, RN

## 2013-08-29 ENCOUNTER — Non-Acute Institutional Stay (SKILLED_NURSING_FACILITY): Payer: Medicare Other | Admitting: Internal Medicine

## 2013-08-29 ENCOUNTER — Encounter: Payer: Self-pay | Admitting: Internal Medicine

## 2013-08-29 DIAGNOSIS — S81812D Laceration without foreign body, left lower leg, subsequent encounter: Secondary | ICD-10-CM

## 2013-08-29 DIAGNOSIS — I4891 Unspecified atrial fibrillation: Secondary | ICD-10-CM

## 2013-08-29 DIAGNOSIS — W19XXXD Unspecified fall, subsequent encounter: Secondary | ICD-10-CM

## 2013-08-29 DIAGNOSIS — S8290XD Unspecified fracture of unspecified lower leg, subsequent encounter for closed fracture with routine healing: Secondary | ICD-10-CM

## 2013-08-29 DIAGNOSIS — E871 Hypo-osmolality and hyponatremia: Secondary | ICD-10-CM

## 2013-08-29 DIAGNOSIS — W19XXXA Unspecified fall, initial encounter: Secondary | ICD-10-CM

## 2013-08-29 DIAGNOSIS — N390 Urinary tract infection, site not specified: Secondary | ICD-10-CM

## 2013-08-29 DIAGNOSIS — S82402D Unspecified fracture of shaft of left fibula, subsequent encounter for closed fracture with routine healing: Secondary | ICD-10-CM

## 2013-08-29 DIAGNOSIS — Y92009 Unspecified place in unspecified non-institutional (private) residence as the place of occurrence of the external cause: Principal | ICD-10-CM

## 2013-08-29 DIAGNOSIS — Z5189 Encounter for other specified aftercare: Secondary | ICD-10-CM

## 2013-08-29 DIAGNOSIS — I5032 Chronic diastolic (congestive) heart failure: Secondary | ICD-10-CM

## 2013-08-29 MED ORDER — METOPROLOL TARTRATE 12.5 MG HALF TABLET: 12.5000 mg | ORAL_TABLET | Freq: Two times a day (BID) | ORAL | Status: DC

## 2013-08-29 MED ORDER — FUROSEMIDE 20 MG PO TABS: 10.0000 mg | ORAL_TABLET | ORAL | Status: DC | PRN

## 2013-08-29 MED ORDER — LEVOFLOXACIN 250 MG PO TABS
250.0000 mg | ORAL_TABLET | Freq: Once | ORAL | Status: AC
  Administered 2013-08-29: 250 mg via ORAL
  Filled 2013-08-29: qty 1

## 2013-08-29 NOTE — Progress Notes (Signed)
Patient to be discharged to [facility]. PIV removed this morning. Telemetry box #16 removed and returned to nurse's station. Family at bedside to gather belongings. Report called to RN at facility. Discharge packet to be sent to facility with ambulance. Will monitor.  Aaron Edelman, Qasim Diveley [place]

## 2013-08-29 NOTE — Progress Notes (Signed)
MRN: [id number] Name: Shirley Strickland  Sex: female Age: 80 y.o. DOB: Dec 31, 1915  [place] #: Helene Kelp Facility/Room: Level Of Care: SNF Provider: Inocencio Homes D Emergency Contacts: Extended Emergency Contact Information Primary Emergency Contact: Oda Cogan [place] Phone: [phone] Relation: Other Secondary Emergency Contact: Smith,Sara  [place] Phone: [phone] Relation: Niece  Code Status: FULL  Allergies: Clarithromycin; [place]; Codeine; Darvocet; Demadex; Diovan hct; Eggs or egg-derived products; Ex-lax; Ibuprofen; Indanediones; Indapamide; Morphine and related; Sulfa antibiotics; Benzonatate; Penicillins; and Septra  Chief Complaint  Patient presents with  . nursing home admission    HPI: Patient is 80 y.o. female who has h/o afib who fell at home and fractured her fibula, and later sustained a wound to left calf. She is here for wound care and PT/OT.  Past Medical History  Diagnosis Date  . 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 Cancer     pt states only skin cancer  . Numbness of legs     started after fall yesterday 07/03/11  . Bruise     bruise on left foot from fall 07/03/11  . Knee pain, acute 07/04/11    Pt states left knee twisted under her in fall 07/03/11 and now painful  . Rash and nonspecific skin eruption 07/04/11    rash on bilateral upper legs  . Syncope   . Fall   . HTN (hypertension)   . A-fib     Past Surgical History  Procedure Laterality Date  . Elbow surgery      right  . Appendectomy    . Exploratory laparotomy  07/04/11    years ago to find cause of bleeding      Medication List    Notice   This visit is during an admission. Changes to the med list made in this visit will be reflected in the After Visit Summary of the admission.      No orders of the defined types were placed in this encounter.    Immunization History  Administered Date(s) Administered  . Td 08/24/2013     History  Substance Use Topics  . Smoking status: Never Smoker   . Smokeless tobacco: Never Used  . Alcohol Use: 1.2 oz/week    2 Cans of beer per week     Comment: 2 cans beer per day    Family history is noncontributory    Review of Systems  DATA OBTAINED: from patient, ONLY C/O IS LEG HURTS WITH WEIGHT BEARING;ONLY REQUEST IS SHE HAVE 2 BEERS A DAY -SHE HAS DONE THIS FOR YEARS-STARTED BY MD WHEN SHE HAD POOR APPETITE GENERAL: Feels well no fevers, fatigue, appetite changes SKIN: No itching, rash  EYES: No eye pain, redness, discharge EARS: No earache, tinnitus, change in hearing NOSE: No congestion, drainage or bleeding  MOUTH/THROAT: No mouth or tooth pain, No sore throat, No difficulty chewing or swallowing  RESPIRATORY: No cough, wheezing, SOB CARDIAC: No chest pain, palpitations, lower extremity edema  GI: No abdominal pain, No N/V/D or constipation, No heartburn or reflux  GU: No dysuria, frequency or urgency, or incontinence  MUSCULOSKELETAL: No unrelieved bone/joint pain NEUROLOGIC: No headache, dizziness or focal weakness PSYCHIATRIC: No overt anxiety or sadness. Sleeps well. No behavior issue.   Filed Vitals:   08/31/13 1136  BP: 177/69  Pulse: 91  Temp: 98.5 F (36.9 C)  Resp: 20    Physical Exam  GENERAL APPEARANCE: Alert, conversant. Appropriately groomed. No acute distress.  SKIN: No diaphoresis rash; l CALF WITH DRESSING IN PLACE HEAD: Normocephalic, atraumatic  EYES: Conjunctiva/lids clear. Pupils round, reactive. EOMs intact.  EARS: External exam WNL, canals clear. Hearing grossly normal.  NOSE: No deformity or discharge.  MOUTH/THROAT: Lips w/o lesions. RESPIRATORY: Breathing is even, unlabored. Lung sounds are clear   CARDIOVASCULAR: Heart RRR no murmurs, rubs or gallops. No peripheral edema.  GASTROINTESTINAL: Abdomen is soft, non-tender, not distended w/ normal bowel sounds. GENITOURINARY: Bladder non tender, not distended   MUSCULOSKELETAL: No abnormal joints or musculature NEUROLOGIC: Oriented X3. Cranial nerves 2-12 grossly intact. Moves all extremities no tremor. PSYCHIATRIC: Mood and affect appropriate to situation, no behavioral issues  Patient Active Problem List   Diagnosis Date Noted  . Hyponatremia 08/31/2013  . UTI (urinary tract infection) 08/31/2013  . Chronic diastolic heart failure 50/04/3817  . Left fibular fracture 08/24/2013  . Laceration of left leg 08/23/2013  . Syncope and collapse 08/23/2013  . HTN (hypertension) 08/23/2013  . Fall at home 08/23/2013  . Sacral insufficiency fracture 07/07/2011  . Fall   . A-fib     CBC    Component Value Date/Time   WBC 13.0* 08/26/2013 0547   RBC 3.02* 08/26/2013 0547   RBC 3.60* 07/10/2011 1010   HGB 8.8* 08/26/2013 0547   HCT 25.8* 08/26/2013 0547   PLT 229 08/26/2013 0547   MCV 85.4 08/26/2013 0547   LYMPHSABS 0.5* 08/23/2013 2008   MONOABS 0.6 08/23/2013 2008   EOSABS 0.0 08/23/2013 2008   BASOSABS 0.0 08/23/2013 2008    CMP     Component Value Date/Time   NA 134* 08/28/2013 0620   K 4.4 08/28/2013 0620   CL 96 08/28/2013 0620   CO2 29 08/28/2013 0620   GLUCOSE 103* 08/28/2013 0620   BUN 30* 08/28/2013 0620   CREATININE 1.17* 08/28/2013 0620   CALCIUM 9.5 08/28/2013 0620   PROT 6.2 08/23/2013 2008   ALBUMIN 3.0* 08/23/2013 2008   AST 15 08/23/2013 2008   ALT 10 08/23/2013 2008   ALKPHOS 46 08/23/2013 2008   BILITOT 0.6 08/23/2013 2008   GFRNONAA 38* 08/28/2013 0620   GFRAA 44* 08/28/2013 0620    Assessment and Plan  Fall at home Unwitnessed fall,prob UTI related-sustained leg lac and fibular fx; No foal Neuro signs but SVT so started on B blocker  Laceration of left leg Not a surgical candidate for repair or flap so wound care until resolution  Left fibular fracture Ortho saw pt in hosp;weight bearing when tolerated  Chronic diastolic heart failure [id number]%;IRCVELFY with IV lasix; d/c on low dose Lasix QOD  A-fib H/o as pt in NSR;no  coag sec to fall risk;bBlocker due to SVT  Hyponatremia Baseline around 127  UTI (urinary tract infection) Empiric tx for 3 days in hospital    Hennie Duos, MD

## 2013-08-29 NOTE — Discharge Summary (Signed)
Physician Discharge Summary  Shirley Strickland IRJ:[id number] DOB: Apr 05, 1916 DOA: 08/23/2013  PCP: Shirley Patricia, MD  Admit date: 08/23/2013 Discharge date: 08/29/2013  Time spent: >35 minutes  Recommendations for Outpatient Follow-up:  F/u with plastic surgery in 1 week  F/u with PCP in 1-2 weeks post rehab  Discharge Diagnoses:  Principal Problem:   Left fibular fracture Active Problems:   Fall   A-fib   Laceration of left leg   Syncope and collapse   HTN (hypertension)   Fall at home   Chronic diastolic heart failure   Discharge Condition: stable   Diet recommendation: heart healthy   Filed Weights   08/24/13 2204 08/25/13 2055 08/26/13 2054  Weight: 62.279 kg (137 lb 4.8 oz) 64.638 kg (142 lb 8 oz) 64.7 kg (142 lb 10.2 oz)    History of present illness/Hospital Course:  80 y.o. female with hx of HTN, easy bruisability, hx of afib, had a syncopal episode a week ago, hurting her right ankle, presents to the ER with 3 falls today. She didn't lose her consciousness these three times, only saying that her legs were weak and gave out. She had no CP, SOB, palpitation, fever, chills, nausea, vomtiing or black stool.   Principal Problem:  Left fibular fracture  Active Problems:  Fall  A-fib  Laceration of left leg  Syncope and collapse  HTN (hypertension)  Fall at home  Chronic diastolic heart failure    1. Syncope and fall; neuro exam no focal; probable uti related  -An unwitnessed falls at home, this was thought to be syncopal from before.  -Came in to the hospital with laceration of her left leg. Some SVT; start BB; stable  2. Suspected acute diastolic heart failure: echo : LVEF 55%, could not evaluated diastolic function  -Improved on IV Lasix. Lungs are clear, no more evidence of HF; changed to PO diuretics prn 3. Left leg laceration and fibula fracture  -Seen by orthopedics; Per orthopedics this is nonoperative fracture.  -per plastics: no surgical candidate;   "wound care orders with Promogran matrix to open wound. Cover with Mepilex or Adaptic and cover with dry dressing three times weekly. Do not feel skin graft/auto graft will be well tolerated in this patient"  -f/u in 1 week outpatient  4. Hyponatremia -Chronic hyponatremia, sodium 127.Marland Kitchen Resolved  5. Atrial fibrillation  -Obviously history of atrial fibrillation as her rhythm as sinus rhythm now.  -She is not on anticoagulation as outpatient due to fall risk  -start BB due to arrythmia non sustained  6. Probable UTI; treated empiric atx 3 days     Procedures:  None  (i.e. Studies not automatically included, echos, thoracentesis, etc; not x-rays)  Consultations:  Plastics  ortho  Discharge Exam: Filed Vitals:   08/29/13 0911  BP: 136/53  Pulse: 72  Temp:   Resp: 18    General: alert Cardiovascular: s1,s2 rrr Respiratory: CTA BL  Discharge Instructions  Discharge Orders   Future Orders Complete By Expires   Diet - low sodium heart healthy  As directed    Discharge instructions  As directed    Comments:     Follow up with plastic surgery in 1 week   Increase activity slowly  As directed        Medication List    STOP taking these medications       predniSONE 20 MG tablet  Commonly known as:  DELTASONE      TAKE these medications  calcium-vitamin D 500-200 MG-UNIT per tablet  Commonly known as:  OSCAL WITH D  Take 2 tablets by mouth daily.     cholecalciferol 1000 UNITS tablet  Commonly known as:  VITAMIN D  Take 1,000 Units by mouth daily.     furosemide 20 MG tablet  Commonly known as:  LASIX  Take 0.5 tablets (10 mg total) by mouth every other day as needed for fluid or edema.     LOTEMAX 0.5 % ophthalmic suspension  Generic drug:  loteprednol  Place 1 drop into both eyes daily as needed (itching).     metoprolol tartrate 12.5 mg Tabs tablet  Commonly known as:  LOPRESSOR  Take 0.5 tablets (12.5 mg total) by mouth 2 (two) times daily.      STOOL SOFTENER PO  Take 1 tablet by mouth at bedtime as needed (constipation).     SYSTANE OP  Place 1 drop into both eyes daily.     telmisartan 80 MG tablet  Commonly known as:  MICARDIS  Take 80 mg by mouth daily.       Allergies  Allergen Reactions  . Clarithromycin Nausea Only  . Cod [employer] Allergy] Other (See Comments)    Stomach pains and passed out recently - doesn't remember reaction before this weekend  . Codeine Nausea And Vomiting  . Darvocet [Propoxyphene N-Acetaminophen] Nausea And Vomiting  . Demadex [Torsemide] Hives and Nausea And Vomiting    UNKNOWN  . Diovan Hct [Valsartan-Hydrochlorothiazide] Other (See Comments)    unknown  . Eggs Or Egg-Derived Products Nausea And Vomiting  . Ex-Lax [Senna] Other (See Comments)    unknown  . Ibuprofen Nausea Only  . Indanediones Other (See Comments)    unknown  . Indapamide Other (See Comments)    unknown  . Morphine And Related Nausea And Vomiting  . Sulfa Antibiotics Other (See Comments)    Couldn't breathe  . Benzonatate Itching and Rash    Eyes swelled  . Penicillins Rash  . Septra [Bactrim] Rash       Follow-up Information   Follow up with [facility], BRINDA, MD In 1 week.   Specialty:  Plastic Surgery   Contact information:   [address] [place] [id number] [phone]        The results of significant diagnostics from this hospitalization (including imaging, microbiology, ancillary and laboratory) are listed below for reference.    Significant Diagnostic Studies: Dg Tibia/fibula Left  08/23/2013   CLINICAL DATA:  [place] today, with pain  EXAM: LEFT TIBIA AND FIBULA - 2 VIEW  COMPARISON:  None.  FINDINGS: There is mild deformity of the neck of the fibula. There appears to be callus formation in this area. An acute fracture line is not appreciated.  IMPRESSION: Mild deformity of the neck of the fibula with evidence of periosteal reaction. This could be due to prior fracture with  interval healing. If there is point tenderness corresponding to this area, an acute fracture should be suspected.   Electronically Signed   By: Skipper Cliche M.D.   On: 08/23/2013 21:52   Dg Ankle Complete Left  08/24/2013   CLINICAL DATA:  Left ankle pain and lower is regular Fall.  EXAM: LEFT ANKLE COMPLETE - 3+ VIEW  COMPARISON:  Tibia and fibula 08/23/2013.  Left ankle 07/04/2011.  FINDINGS: Soft tissue lacerations demonstrated over the lateral aspect of the distal left lower leg. Diffuse bone demineralization. No displaced fractures demonstrated in the left ankle. There is  suggestion of very fracture or deformity of the 5th metatarsal bone. This likely represents old fracture deformity, in comparison to previous left foot radiograph from 07/04/2011.  IMPRESSION: Old metatarsal fracture deformity. Soft tissue lacerations. Diffuse bone demineralization. No acute fracture demonstrated in the left ankle.   Electronically Signed   By: Lucienne Capers M.D.   On: 08/24/2013 01:26   Dg Ankle Complete Right  08/23/2013   CLINICAL DATA:  Fall with right ankle pain  EXAM: RIGHT ANKLE - COMPLETE 3+ VIEW  COMPARISON:  None.  FINDINGS: Diffuse osteopenia. Subtle lucency anterior tibial distal epiphysis likely a nutrient foramen. No fracture or joint effusion. Mortise is intact.  IMPRESSION: No acute traumatic osseous abnormality, although would recommend follow up imaging if symptoms persist in one week given diffuse osteopenia.   Electronically Signed   By: Skipper Cliche M.D.   On: 08/23/2013 21:56    Microbiology: Recent Results (from the past 240 hour(s))  URINE CULTURE     Status: None   Collection Time    08/24/13  5:16 AM      Result Value Range Status   Specimen Description URINE, RANDOM   Final   Special Requests NONE   Final   Culture  Setup Time     Final   Value: 08/24/2013 09:00     Performed at [facility]     Final   Value: >=100,000 COLONIES/ML     Performed  at [facility]   Culture     Final   Value: Multiple bacterial morphotypes present, none predominant. Suggest appropriate recollection if clinically indicated.     Performed at [facility]   Report Status 08/25/2013 FINAL   Final     Labs: Basic Metabolic Panel:  Recent Labs Lab 08/23/13 2008 08/24/13 0157 08/25/13 0427 08/26/13 0547 08/27/13 0450 08/28/13 0620  NA 127*  --  132* 135* 135* 134*  K 4.5  --  5.0 3.9 4.1 4.4  CL 90*  --  100 98 95* 96  CO2 23  --  23 27 30 29   GLUCOSE 159*  --  162* 100* 99 103*  BUN 27*  --  26* 25* 29* 30*  CREATININE 1.19* 1.05 0.95 0.92 0.91 1.17*  CALCIUM 10.1  --  8.8 8.9 9.3 9.5   Liver Function Tests:  Recent Labs Lab 08/23/13 2008  AST 15  ALT 10  ALKPHOS 46  BILITOT 0.6  PROT 6.2  ALBUMIN 3.0*   No results found for this basename: LIPASE, AMYLASE,  in the last 168 hours No results found for this basename: AMMONIA,  in the last 168 hours CBC:  Recent Labs Lab 08/23/13 2008 08/24/13 0157 08/25/13 0427 08/26/13 0547  WBC 13.2* 14.1* 10.6* 13.0*  NEUTROABS 12.1*  --   --   --   HGB 10.1* 8.7* 8.2* 8.8*  HCT 29.7* 25.9* 24.4* 25.8*  MCV 85.8 85.5 86.2 85.4  PLT 176 152 168 229   Cardiac Enzymes: No results found for this basename: CKTOTAL, CKMB, CKMBINDEX, TROPONINI,  in the last 168 hours BNP: BNP (last 3 results)  Recent Labs  08/25/13 0427  PROBNP 3100.0*   CBG: No results found for this basename: GLUCAP,  in the last 168 hours     Signed:  Rowe Clack N  [facility] 08/29/2013, 12:07 PM

## 2013-08-29 NOTE — Progress Notes (Signed)
Pt states she has a very large skin tear to her left lower extremity. Gauze kerlix dressing intact at present. Pt states it does not need to be changed right now. LLE very tender to touch. Will monitor.  Aaron Edelman, [place]

## 2013-08-29 NOTE — Consult Note (Addendum)
WOC follow-up: Plastics team has now seen the patient and recommended further plan of care for left leg wound care on 1/2.  They plan to follow as an outpatient.  Please refer further questions to this team and re-consult if further assistance is needed.  Thank-you,  Julien Girt MSN, [place], [place], [place], [place]

## 2013-08-31 DIAGNOSIS — N390 Urinary tract infection, site not specified: Secondary | ICD-10-CM | POA: Insufficient documentation

## 2013-08-31 DIAGNOSIS — E871 Hypo-osmolality and hyponatremia: Secondary | ICD-10-CM | POA: Insufficient documentation

## 2013-08-31 NOTE — Assessment & Plan Note (Signed)
Baseline around 127

## 2013-08-31 NOTE — Assessment & Plan Note (Signed)
Ortho saw pt in hosp;weight bearing when tolerated

## 2013-08-31 NOTE — Assessment & Plan Note (Signed)
H/o as pt in NSR;no coag sec to fall risk;bBlocker due to SVT

## 2013-08-31 NOTE — Assessment & Plan Note (Addendum)
LVEF-55%;improved with IV lasix; d/c on low dose Lasix QOD

## 2013-08-31 NOTE — Assessment & Plan Note (Signed)
Empiric tx for 3 days in hospital

## 2013-08-31 NOTE — Assessment & Plan Note (Signed)
Not a surgical candidate for repair or flap so wound care until resolution

## 2013-08-31 NOTE — Assessment & Plan Note (Signed)
Unwitnessed fall,prob UTI related-sustained leg lac and fibular fx; No foal Neuro signs but SVT so started on B blocker

## 2013-09-13 ENCOUNTER — Non-Acute Institutional Stay (SKILLED_NURSING_FACILITY): Payer: Medicare Other | Admitting: Nurse Practitioner

## 2013-09-13 DIAGNOSIS — R21 Rash and other nonspecific skin eruption: Secondary | ICD-10-CM

## 2013-09-13 NOTE — Progress Notes (Signed)
Patient ID: Shirley Strickland, female   DOB: [birth year], 80 y.o.   MRN: [id number]    Nursing Home Location:  [place] of Service: SNF (31)  PCP: [name], MD  Allergies  Allergen Reactions  . Clarithromycin Nausea Only  . [employer] Allergy] Other (See Comments)    Stomach pains and passed out recently - doesn't remember reaction before this weekend  . Codeine Nausea And Vomiting  . Darvocet [Propoxyphene N-Acetaminophen] Nausea And Vomiting  . Demadex [Torsemide] Hives and Nausea And Vomiting    UNKNOWN  . Diovan Hct [Valsartan-Hydrochlorothiazide] Other (See Comments)    unknown  . Eggs Or Egg-Derived Products Nausea And Vomiting  . Ex-Lax [Senna] Other (See Comments)    unknown  . Ibuprofen Nausea Only  . Indanediones Other (See Comments)    unknown  . Indapamide Other (See Comments)    unknown  . Morphine And Related Nausea And Vomiting  . Sulfa Antibiotics Other (See Comments)    Couldn't breathe  . Benzonatate Itching and Rash    Eyes swelled  . Penicillins Rash  . Septra [Bactrim] Rash    Chief Complaint  Patient presents with  . Acute Visit    HPI:  80 year old female being seen today due to itching and rash over the past 2 days; pt reports this happens frequently at home and takes predisone as needed for this; she keeps supply on hand so she can take it at the first sign of a rash and this helps it; benedryl makes her nauseous and she does not tolerate it well; rash on chest, stomach, back and bilateral arms, does not effect swallowing, or  Tongue; no trouble eating; no shortness of breath or wheezing   Review of Systems:  Review of Systems  Constitutional: Negative for fever and chills.  Respiratory: Negative for cough, shortness of breath and wheezing.   Cardiovascular: Negative for chest pain, palpitations and leg swelling.  Gastrointestinal: Negative for heartburn, nausea, vomiting and abdominal pain.  Skin: Positive for  itching and rash.  Neurological: Negative for dizziness and sensory change.     Past Medical History  Diagnosis Date  . 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 Cancer     pt states only skin cancer  . Numbness of legs     started after fall yesterday 07/03/11  . Bruise     bruise on left foot from fall 07/03/11  . Knee pain, acute 07/04/11    Pt states left knee twisted under her in fall 07/03/11 and now painful  . Rash and nonspecific skin eruption 07/04/11    rash on bilateral upper legs  . Syncope   . Fall   . HTN (hypertension)   . A-fib    Past Surgical History  Procedure Laterality Date  . Elbow surgery      right  . Appendectomy    . Exploratory laparotomy  07/04/11    years ago to find cause of bleeding   Social History:   reports that she has never smoked. She has never used smokeless tobacco. She reports that she drinks about 1.2 ounces of alcohol per week. She reports that she does not use illicit drugs.  No family history on file.  Medications: Patient's Medications  New Prescriptions   No medications on file  Previous Medications   CALCIUM-VITAMIN D (OSCAL WITH D) 500-200 MG-UNIT PER TABLET    Take 2 tablets by mouth daily.  CHOLECALCIFEROL (VITAMIN D) 1000 UNITS TABLET    Take 1,000 Units by mouth daily.    DOCUSATE CALCIUM (STOOL SOFTENER PO)    Take 1 tablet by mouth at bedtime as needed (constipation).   FUROSEMIDE (LASIX) 20 MG TABLET    Take 0.5 tablets (10 mg total) by mouth every other day as needed for fluid or edema.   LOTEPREDNOL (LOTEMAX) 0.5 % OPHTHALMIC SUSPENSION    Place 1 drop into both eyes daily as needed (itching).   METOPROLOL TARTRATE (LOPRESSOR) 12.5 MG TABS TABLET    Take 0.5 tablets (12.5 mg total) by mouth 2 (two) times daily.   POLYETHYL GLYCOL-PROPYL GLYCOL (SYSTANE OP)    Place 1 drop into both eyes daily.   TELMISARTAN (MICARDIS) 80 MG TABLET    Take 80 mg by mouth daily.    Modified Medications   No medications on file    Discontinued Medications   No medications on file     Physical Exam: Physical Exam  HENT:  Head: Normocephalic and atraumatic.  Right Ear: External ear normal.  Left Ear: External ear normal.  Nose: Nose normal.  Mouth/Throat: Oropharynx is clear and moist. No oropharyngeal exudate.  No swelling or rash noted in mouth   Neck: Normal range of motion. Neck supple. No JVD present. No tracheal deviation present. No thyromegaly present.  Cardiovascular: Normal rate, regular rhythm and normal heart sounds.   Pulmonary/Chest: Effort normal and breath sounds normal. No stridor. No respiratory distress. She has no wheezes.  Abdominal: Soft. Bowel sounds are normal.  Lymphadenopathy:    She has no cervical adenopathy.  Skin: Rash (diffuse red rash on chest, breast, back, bilateral arms-- pt itching cont) noted.  Psychiatric: Affect normal.    Filed Vitals:   09/13/13 1502  BP: 116/54  Pulse: 60  Temp: 97.3 F (36.3 C)  Resp: 20  SpO2: 98%      Labs reviewed: Basic Metabolic Panel:  Recent Labs  08/26/13 0547 08/27/13 0450 08/28/13 0620  NA 135* 135* 134*  K 3.9 4.1 4.4  CL 98 95* 96  CO2 27 30 29   GLUCOSE 100* 99 103*  BUN 25* 29* 30*  CREATININE 0.92 0.91 1.17*  CALCIUM 8.9 9.3 9.5   Liver Function Tests:  Recent Labs  08/23/13 2008  AST 15  ALT 10  ALKPHOS 46  BILITOT 0.6  PROT 6.2  ALBUMIN 3.0*   No results found for this basename: LIPASE, AMYLASE,  in the last 8760 hours No results found for this basename: AMMONIA,  in the last 8760 hours CBC:  Recent Labs  08/23/13 2008 08/24/13 0157 08/25/13 0427 08/26/13 0547  WBC 13.2* 14.1* 10.6* 13.0*  NEUTROABS 12.1*  --   --   --   HGB 10.1* 8.7* 8.2* 8.8*  HCT 29.7* 25.9* 24.4* 25.8*  MCV 85.8 85.5 86.2 85.4  PLT 176 152 168 229     Assessment/Plan 1. Rash -pt uses prednisone as needed at home due to frequent rashes/hives due to being "allergic to everything" -will give prednisone 40 mg PO  daily for 3 days -Vistaril 50 mg q 6 hours for 3 days -zantac 150 mg BID   - to notify if rash becomes worse, shortness of breath or airway becomes involved

## 2013-09-23 ENCOUNTER — Inpatient Hospital Stay (HOSPITAL_COMMUNITY)
Admission: EM | Admit: 2013-09-23 | Discharge: 2013-09-30 | DRG: 253 | Disposition: A | Discharge status: Skilled Nursing Facility | Payer: Medicare Other | Attending: Internal Medicine | Admitting: Internal Medicine

## 2013-09-23 ENCOUNTER — Encounter (HOSPITAL_COMMUNITY): Payer: Self-pay | Admitting: Emergency Medicine

## 2013-09-23 DIAGNOSIS — I5032 Chronic diastolic (congestive) heart failure: Secondary | ICD-10-CM

## 2013-09-23 DIAGNOSIS — D509 Iron deficiency anemia, unspecified: Secondary | ICD-10-CM | POA: Diagnosis present

## 2013-09-23 DIAGNOSIS — Y92009 Unspecified place in unspecified non-institutional (private) residence as the place of occurrence of the external cause: Secondary | ICD-10-CM

## 2013-09-23 DIAGNOSIS — I82412 Acute embolism and thrombosis of left femoral vein: Secondary | ICD-10-CM

## 2013-09-23 DIAGNOSIS — I514 Myocarditis, unspecified: Secondary | ICD-10-CM | POA: Diagnosis present

## 2013-09-23 DIAGNOSIS — Z889 Allergy status to unspecified drugs, medicaments and biological substances status: Secondary | ICD-10-CM

## 2013-09-23 DIAGNOSIS — W19XXXA Unspecified fall, initial encounter: Secondary | ICD-10-CM

## 2013-09-23 DIAGNOSIS — I82409 Acute embolism and thrombosis of unspecified deep veins of unspecified lower extremity: Secondary | ICD-10-CM

## 2013-09-23 DIAGNOSIS — L03116 Cellulitis of left lower limb: Secondary | ICD-10-CM

## 2013-09-23 DIAGNOSIS — T502X5A Adverse effect of carbonic-anhydrase inhibitors, benzothiadiazides and other diuretics, initial encounter: Secondary | ICD-10-CM | POA: Diagnosis not present

## 2013-09-23 DIAGNOSIS — N179 Acute kidney failure, unspecified: Secondary | ICD-10-CM | POA: Diagnosis present

## 2013-09-23 DIAGNOSIS — L03119 Cellulitis of unspecified part of limb: Secondary | ICD-10-CM

## 2013-09-23 DIAGNOSIS — R710 Precipitous drop in hematocrit: Secondary | ICD-10-CM | POA: Diagnosis present

## 2013-09-23 DIAGNOSIS — I824Y9 Acute embolism and thrombosis of unspecified deep veins of unspecified proximal lower extremity: Principal | ICD-10-CM | POA: Diagnosis present

## 2013-09-23 DIAGNOSIS — I4891 Unspecified atrial fibrillation: Secondary | ICD-10-CM

## 2013-09-23 DIAGNOSIS — D649 Anemia, unspecified: Secondary | ICD-10-CM | POA: Diagnosis present

## 2013-09-23 DIAGNOSIS — L02419 Cutaneous abscess of limb, unspecified: Secondary | ICD-10-CM | POA: Diagnosis present

## 2013-09-23 DIAGNOSIS — I10 Essential (primary) hypertension: Secondary | ICD-10-CM

## 2013-09-23 DIAGNOSIS — Z79899 Other long term (current) drug therapy: Secondary | ICD-10-CM

## 2013-09-23 DIAGNOSIS — D638 Anemia in other chronic diseases classified elsewhere: Secondary | ICD-10-CM | POA: Diagnosis present

## 2013-09-23 DIAGNOSIS — L0390 Cellulitis, unspecified: Secondary | ICD-10-CM

## 2013-09-23 LAB — CBC WITH DIFFERENTIAL/PLATELET
Basophils Absolute: 0 10*3/uL (ref 0.0–0.1)
Basophils Relative: 0 % (ref 0–1)
Eosinophils Absolute: 0.4 10*3/uL (ref 0.0–0.7)
Eosinophils Relative: 3 % (ref 0–5)
HEMATOCRIT: 29.1 % — AB (ref 36.0–46.0)
HEMOGLOBIN: 9.5 g/dL — AB (ref 12.0–15.0)
LYMPHS ABS: 2 10*3/uL (ref 0.7–4.0)
LYMPHS PCT: 13 % (ref 12–46)
MCH: 27.6 pg (ref 26.0–34.0)
MCHC: 32.6 g/dL (ref 30.0–36.0)
MCV: 84.6 fL (ref 78.0–100.0)
MONO ABS: 1.8 10*3/uL — AB (ref 0.1–1.0)
MONOS PCT: 12 % (ref 3–12)
NEUTROS ABS: 10.8 10*3/uL — AB (ref 1.7–7.7)
Neutrophils Relative %: 72 % (ref 43–77)
Platelets: 241 10*3/uL (ref 150–400)
RBC: 3.44 MIL/uL — ABNORMAL LOW (ref 3.87–5.11)
RDW: 14.4 % (ref 11.5–15.5)
WBC: 15 10*3/uL — AB (ref 4.0–10.5)

## 2013-09-23 LAB — COMPREHENSIVE METABOLIC PANEL
ALT: 8 U/L (ref 0–35)
AST: 10 U/L (ref 0–37)
Albumin: 2.5 g/dL — ABNORMAL LOW (ref 3.5–5.2)
Alkaline Phosphatase: 82 U/L (ref 39–117)
BUN: 29 mg/dL — AB (ref 6–23)
CHLORIDE: 95 meq/L — AB (ref 96–112)
CO2: 25 mEq/L (ref 19–32)
CREATININE: 1.28 mg/dL — AB (ref 0.50–1.10)
Calcium: 9.2 mg/dL (ref 8.4–10.5)
GFR calc Af Amer: 40 mL/min — ABNORMAL LOW (ref >90)
GFR calc non Af Amer: 34 mL/min — ABNORMAL LOW (ref >90)
GLUCOSE: 145 mg/dL — AB (ref 70–99)
Potassium: 5.1 mEq/L (ref 3.7–5.3)
Sodium: 133 mEq/L — ABNORMAL LOW (ref 137–147)
Total Bilirubin: 0.3 mg/dL (ref 0.3–1.2)
Total Protein: 6.1 g/dL (ref 6.0–8.3)

## 2013-09-23 MED ORDER — DEXTROSE 5 % IV SOLN
1.0000 g | INTRAVENOUS | Status: DC
  Filled 2013-09-23: qty 10

## 2013-09-23 MED ORDER — ENOXAPARIN SODIUM 60 MG/0.6ML ~~LOC~~ SOLN
60.0000 mg | Freq: Once | SUBCUTANEOUS | Status: DC
  Filled 2013-09-23: qty 0.6

## 2013-09-23 MED ORDER — VANCOMYCIN HCL IN DEXTROSE 1-5 GM/200ML-% IV SOLN: 1000.0000 mg | Freq: Once | INTRAVENOUS | Status: DC

## 2013-09-23 NOTE — ED Notes (Signed)
Pt arrives via [facility] from [place] for eval of left leg swelling and increased pain. Pt has open fib fx from previous encounter in December, has been doing PT for injury and today noticed increased swelling and pain. Pt plastic surgeon stated that pt should come to ED for eval of possible DVT. PTAR states couldn't feel a pulse en route. No meds given en route, pt is alert and oriented, nad noted. Family at bedside.

## 2013-09-23 NOTE — ED Provider Notes (Signed)
CSN: [id number]     Arrival date & time 09/23/13  1958 History   First MD Initiated Contact with Patient 09/23/13 2113     Chief Complaint  Patient presents with  . Leg Pain  . Leg Swelling   (Consider location/radiation/quality/duration/timing/severity/associated sxs/prior Treatment) Patient is a 80 y.o. female presenting with leg pain. The history is provided by the patient and a relative. No language interpreter was used.  Leg Pain Location:  Leg Injury: yes   Mechanism of injury: amputation   Pain details:    Quality:  Aching   Severity:  Severe   Onset quality:  Gradual   Duration:  1 day   Timing:  Constant   Progression:  Worsening Chronicity:  Recurrent Foreign body present:  No foreign bodies Prior injury to area:  Yes Relieved by:  Nothing Worsened by:  Nothing tried Ineffective treatments:  None tried Pt is a resident at [place] [facility] home. She is going thru Pt and being treated by Dr. Ashley Mariner Plastic surgeon  [phone])  Pt was seen today and MD did not think pt had infection.   Pt reports leg is red and swollen.   Md concerned about DVT.  Pt is not on a blood thinner.  Pt was on lovenox in December when she was in the hospital.    Past Medical History  Diagnosis Date  . 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 Cancer     pt states only skin cancer  . Numbness of legs     started after fall yesterday 07/03/11  . Bruise     bruise on left foot from fall 07/03/11  . Knee pain, acute 07/04/11    Pt states left knee twisted under her in fall 07/03/11 and now painful  . Rash and nonspecific skin eruption 07/04/11    rash on bilateral upper legs  . Syncope   . Fall   . HTN (hypertension)   . A-fib    Past Surgical History  Procedure Laterality Date  . Elbow surgery      right  . Appendectomy    . Exploratory laparotomy  07/04/11    years ago to find cause of bleeding   No family history on file. History  Substance Use Topics  . Smoking status: Never  Smoker   . Smokeless tobacco: Never Used  . Alcohol Use: 1.2 oz/week    2 Cans of beer per week     Comment: 2 cans beer per day   OB History   Grav Para Term Preterm Abortions TAB SAB Ect Mult Living                 Review of Systems  Skin: Positive for wound.  All other systems reviewed and are negative.    Allergies  Clarithromycin; Cod; Codeine; Darvocet; Demadex; Diovan hct; Eggs or egg-derived products; Ex-lax; Ibuprofen; Indanediones; Indapamide; Morphine and related; Sulfa antibiotics; Benzonatate; Penicillins; and Septra  Home Medications   Current Outpatient Rx  Name  Route  Sig  Dispense  Refill  . calcium-vitamin D (OSCAL WITH D) 500-200 MG-UNIT per tablet   Oral   Take 2 tablets by mouth daily.         . cholecalciferol (VITAMIN D) 1000 UNITS tablet   Oral   Take 1,000 Units by mouth daily.          . furosemide (LASIX) 20 MG tablet   Oral   Take 0.5 tablets (10 mg total) by  mouth every other day as needed for fluid or edema.   30 tablet      . loteprednol (LOTEMAX) 0.5 % ophthalmic suspension   Both Eyes   Place 1 drop into both eyes daily as needed (itching).         . metoprolol tartrate (LOPRESSOR) 12.5 mg TABS tablet   Oral   Take 0.5 tablets (12.5 mg total) by mouth 2 (two) times daily.         Vladimir Faster Glycol-Propyl Glycol (SYSTANE OP)   Both Eyes   Place 1 drop into both eyes daily.         Marland Kitchen telmisartan (MICARDIS) 80 MG tablet   Oral   Take 80 mg by mouth daily.            BP 128/48  Pulse 67  Temp(Src) 98.9 F (37.2 C) (Oral)  Resp 20  SpO2 98% Physical Exam  Nursing note and vitals reviewed. Constitutional: She appears well-developed and well-nourished.  HENT:  Head: Normocephalic.  Right Ear: External ear normal.  Left Ear: External ear normal.  Eyes: Conjunctivae and EOM are normal. Pupils are equal, round, and reactive to light.  Neck: Normal range of motion. Neck supple.  Cardiovascular: Normal rate,  normal heart sounds and intact distal pulses.   Pulmonary/Chest: Effort normal and breath sounds normal.  Abdominal: Soft. Bowel sounds are normal.  Musculoskeletal: She exhibits tenderness.  Open bleeding wound,  Erythema around wound to heel.  Erythema and swelling to groin,  Neurological: She is alert. She has normal reflexes.  Skin: There is erythema.  Psychiatric: She has a normal mood and affect.    ED Course  Procedures (including critical care time) Labs Review Labs Reviewed  CBC WITH DIFFERENTIAL - Abnormal; Notable for the following:    WBC 15.0 (*)    RBC 3.44 (*)    Hemoglobin 9.5 (*)    HCT 29.1 (*)    Neutro Abs 10.8 (*)    Monocytes Absolute 1.8 (*)    All other components within normal limits  COMPREHENSIVE METABOLIC PANEL - Abnormal; Notable for the following:    Sodium 133 (*)    Chloride 95 (*)    Glucose, Bld 145 (*)    BUN 29 (*)    Creatinine, Ser 1.28 (*)    Albumin 2.5 (*)    GFR calc non Af Amer 34 (*)    GFR calc Af Amer 40 (*)    All other components within normal limits   Imaging Review No results found.  EKG Interpretation   None       MDM RN was able to doppler a pulse.   I ordered vancomycin and rocephin.   I think swelling is infection however pt will need a doppler in am.  I will consult hospitalist for admission and to discuss Lovenox   1. Cellulitis of left leg        Fransico Meadow, [place] 09/23/13 2343

## 2013-09-24 DIAGNOSIS — M79609 Pain in unspecified limb: Secondary | ICD-10-CM

## 2013-09-24 DIAGNOSIS — M7989 Other specified soft tissue disorders: Secondary | ICD-10-CM

## 2013-09-24 DIAGNOSIS — L02419 Cutaneous abscess of limb, unspecified: Secondary | ICD-10-CM

## 2013-09-24 DIAGNOSIS — I5032 Chronic diastolic (congestive) heart failure: Secondary | ICD-10-CM

## 2013-09-24 DIAGNOSIS — W19XXXA Unspecified fall, initial encounter: Secondary | ICD-10-CM

## 2013-09-24 DIAGNOSIS — I4891 Unspecified atrial fibrillation: Secondary | ICD-10-CM

## 2013-09-24 DIAGNOSIS — L03119 Cellulitis of unspecified part of limb: Secondary | ICD-10-CM

## 2013-09-24 DIAGNOSIS — L0390 Cellulitis, unspecified: Secondary | ICD-10-CM

## 2013-09-24 DIAGNOSIS — I10 Essential (primary) hypertension: Secondary | ICD-10-CM

## 2013-09-24 DIAGNOSIS — L0291 Cutaneous abscess, unspecified: Secondary | ICD-10-CM

## 2013-09-24 LAB — BASIC METABOLIC PANEL
BUN: 29 mg/dL — AB (ref 6–23)
CHLORIDE: 96 meq/L (ref 96–112)
CO2: 23 mEq/L (ref 19–32)
Calcium: 8.8 mg/dL (ref 8.4–10.5)
Creatinine, Ser: 1.27 mg/dL — ABNORMAL HIGH (ref 0.50–1.10)
GFR calc Af Amer: 40 mL/min — ABNORMAL LOW (ref >90)
GFR calc non Af Amer: 35 mL/min — ABNORMAL LOW (ref >90)
Glucose, Bld: 116 mg/dL — ABNORMAL HIGH (ref 70–99)
POTASSIUM: 5 meq/L (ref 3.7–5.3)
Sodium: 133 mEq/L — ABNORMAL LOW (ref 137–147)

## 2013-09-24 LAB — CBC
HEMATOCRIT: 26.8 % — AB (ref 36.0–46.0)
HEMOGLOBIN: 9.2 g/dL — AB (ref 12.0–15.0)
MCH: 28.9 pg (ref 26.0–34.0)
MCHC: 34.3 g/dL (ref 30.0–36.0)
MCV: 84.3 fL (ref 78.0–100.0)
Platelets: 218 10*3/uL (ref 150–400)
RBC: 3.18 MIL/uL — AB (ref 3.87–5.11)
RDW: 14.4 % (ref 11.5–15.5)
WBC: 14.5 10*3/uL — ABNORMAL HIGH (ref 4.0–10.5)

## 2013-09-24 LAB — PROTIME-INR
INR: 1.01 (ref 0.00–1.49)
PROTHROMBIN TIME: 13.1 s (ref 11.6–15.2)

## 2013-09-24 LAB — APTT: aPTT: 33 seconds (ref 24–37)

## 2013-09-24 MED ORDER — DOXYCYCLINE HYCLATE 100 MG IV SOLR
100.0000 mg | Freq: Once | INTRAVENOUS | Status: AC
  Administered 2013-09-24: 100 mg via INTRAVENOUS
  Filled 2013-09-24: qty 100

## 2013-09-24 MED ORDER — ACETAMINOPHEN 650 MG RE SUPP: 650.0000 mg | Freq: Four times a day (QID) | RECTAL | Status: DC | PRN

## 2013-09-24 MED ORDER — ONDANSETRON HCL 4 MG/2ML IJ SOLN: 4.0000 mg | Freq: Four times a day (QID) | INTRAMUSCULAR | Status: DC | PRN

## 2013-09-24 MED ORDER — SODIUM CHLORIDE 0.9 % IV SOLN
INTRAVENOUS | Status: AC
  Administered 2013-09-24: 02:00:00 via INTRAVENOUS

## 2013-09-24 MED ORDER — ENOXAPARIN SODIUM 100 MG/ML ~~LOC~~ SOLN: 100.0000 mg | SUBCUTANEOUS | Status: DC

## 2013-09-24 MED ORDER — ONDANSETRON HCL 4 MG PO TABS: 4.0000 mg | ORAL_TABLET | Freq: Four times a day (QID) | ORAL | Status: DC | PRN

## 2013-09-24 MED ORDER — DOXYCYCLINE HYCLATE 100 MG IV SOLR
100.0000 mg | Freq: Two times a day (BID) | INTRAVENOUS | Status: DC
  Administered 2013-09-24 – 2013-09-27 (×8): 100 mg via INTRAVENOUS
  Filled 2013-09-24 (×9): qty 100

## 2013-09-24 MED ORDER — ACETAMINOPHEN 325 MG PO TABS
650.0000 mg | ORAL_TABLET | Freq: Four times a day (QID) | ORAL | Status: DC | PRN
  Administered 2013-09-27: 650 mg via ORAL
  Filled 2013-09-24: qty 2

## 2013-09-24 MED ORDER — ENOXAPARIN SODIUM 100 MG/ML ~~LOC~~ SOLN
100.0000 mg | Freq: Once | SUBCUTANEOUS | Status: DC
  Filled 2013-09-24: qty 1

## 2013-09-24 MED ORDER — LOTEPREDNOL ETABONATE 0.5 % OP SUSP: 1.0000 [drp] | Freq: Every day | OPHTHALMIC | Status: DC | PRN

## 2013-09-24 MED ORDER — SODIUM CHLORIDE 0.9 % IJ SOLN
3.0000 mL | Freq: Two times a day (BID) | INTRAMUSCULAR | Status: DC
  Administered 2013-09-24 – 2013-09-30 (×10): 3 mL via INTRAVENOUS

## 2013-09-24 MED ORDER — ENOXAPARIN SODIUM 60 MG/0.6ML ~~LOC~~ SOLN
60.0000 mg | SUBCUTANEOUS | Status: DC
  Filled 2013-09-24: qty 0.6

## 2013-09-24 MED ORDER — ENOXAPARIN SODIUM 60 MG/0.6ML ~~LOC~~ SOLN
60.0000 mg | Freq: Once | SUBCUTANEOUS | Status: AC
  Administered 2013-09-24: 60 mg via SUBCUTANEOUS
  Filled 2013-09-24: qty 0.6

## 2013-09-24 MED ORDER — METOPROLOL TARTRATE 12.5 MG HALF TABLET
12.5000 mg | ORAL_TABLET | Freq: Two times a day (BID) | ORAL | Status: DC
  Administered 2013-09-24 – 2013-09-30 (×13): 12.5 mg via ORAL
  Filled 2013-09-24 (×15): qty 1

## 2013-09-24 MED ORDER — ENOXAPARIN SODIUM 80 MG/0.8ML ~~LOC~~ SOLN
65.0000 mg | SUBCUTANEOUS | Status: DC
  Administered 2013-09-24 – 2013-09-27 (×3): 65 mg via SUBCUTANEOUS
  Filled 2013-09-24 (×5): qty 0.8

## 2013-09-24 NOTE — Progress Notes (Signed)
*  Preliminary Results* Left lower extremity venous duplex completed. Left lower extremity is positive for acute, occlusive, deep vein thrombosis involving the left saphenofemoral junction, common femoral, femoral, popliteal, and posterior tibial veins. There is no evidence of left Baker's cyst.  Preliminary results discussed with Chester Holstein, RN.  09/24/2013 12:15 PM  Maudry Mayhew, RVT, RDCS, RDMS

## 2013-09-24 NOTE — Progress Notes (Signed)
ANTICOAGULATION CONSULT NOTE - Initial Consult  Pharmacy Consult for Lovenox Indication: r/o DVT  Allergies  Allergen Reactions  . Clarithromycin Nausea Only  . [employer] Allergy] Other (See Comments)    Stomach pains and passed out recently - doesn't remember reaction before this weekend  . Codeine Nausea And Vomiting  . Darvocet [Propoxyphene N-Acetaminophen] Nausea And Vomiting  . Demadex [Torsemide] Hives and Nausea And Vomiting    UNKNOWN  . Diovan Hct [Valsartan-Hydrochlorothiazide] Other (See Comments)    unknown  . Eggs Or Egg-Derived Products Nausea And Vomiting  . Ex-Lax [Senna] Other (See Comments)    unknown  . Ibuprofen Nausea Only  . Indanediones Other (See Comments)    unknown  . Indapamide Other (See Comments)    unknown  . Morphine And Related Nausea And Vomiting  . Sulfa Antibiotics Other (See Comments)    Couldn't breathe  . Benzonatate Itching and Rash    Eyes swelled  . Penicillins Rash  . Septra [Bactrim] Rash    Patient Measurements: Height: 5' 6.14" (168 cm) Weight: 142 lb 10.2 oz (64.7 kg) IBW/kg (Calculated) : 59.63  Vital Signs: Temp: 98.2 F (36.8 C) (01/31 0030) Temp src: Oral (01/30 2004) BP: 146/50 mmHg (01/31 0030) Pulse Rate: 74 (01/31 0030)  Labs:  Recent Labs  09/23/13 2138 09/23/13 2323  HGB 9.5*  --   HCT 29.1*  --   PLT 241  --   APTT  --  33  LABPROT  --  13.1  INR  --  1.01  CREATININE 1.28*  --     Estimated Creatinine Clearance: 24.7 ml/min (by C-G formula based on Cr of 1.28).   Medical History: Past Medical History  Diagnosis Date  . 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 Cancer     pt states only skin cancer  . Numbness of legs     started after fall yesterday 07/03/11  . Bruise     bruise on left foot from fall 07/03/11  . Knee pain, acute 07/04/11    Pt states left knee twisted under her in fall 07/03/11 and now painful  . Rash and nonspecific skin eruption 07/04/11    rash on bilateral upper  legs  . Syncope   . Fall   . HTN (hypertension)   . A-fib     Medications:  Prescriptions prior to admission  Medication Sig Dispense Refill  . calcium-vitamin D (OSCAL WITH D) 500-200 MG-UNIT per tablet Take 2 tablets by mouth daily.      . cholecalciferol (VITAMIN D) 1000 UNITS tablet Take 1,000 Units by mouth daily.       . furosemide (LASIX) 20 MG tablet Take 0.5 tablets (10 mg total) by mouth every other day as needed for fluid or edema.  30 tablet    . loteprednol (LOTEMAX) 0.5 % ophthalmic suspension Place 1 drop into both eyes daily as needed (itching).      . metoprolol tartrate (LOPRESSOR) 12.5 mg TABS tablet Take 0.5 tablets (12.5 mg total) by mouth 2 (two) times daily.      Vladimir Faster Glycol-Propyl Glycol (SYSTANE OP) Place 1 drop into both eyes daily.      Marland Kitchen telmisartan (MICARDIS) 80 MG tablet Take 80 mg by mouth daily.         Scheduled:  . doxycycline (VIBRAMYCIN) IV  100 mg Intravenous Q12H  . enoxaparin (LOVENOX) injection  100 mg Subcutaneous Once  . [START ON 09/25/2013] enoxaparin (LOVENOX) injection  100 mg Subcutaneous Q24H  . metoprolol tartrate  12.5 mg Oral BID  . sodium chloride  3 mL Intravenous Q12H   Infusions:  . sodium chloride      Assessment: 80yo female had fall on 12/30 w/ LLE wound, now concern for cellulitis vs DVT, awaiting imaging, to begin Lovenox until DVT r/o.  Goal of Therapy:  Anti-Xa level 0.6-1 units/ml 4hrs after LMWH dose given Monitor platelets by anticoagulation protocol: Yes   Plan:  Will begin Lovenox 60mg  SQ Q24H and monitor CBC.  Shirley Strickland, PharmD, BCPS  09/24/2013,1:28 AM

## 2013-09-24 NOTE — H&P (Signed)
[facility] History and Physical  Shirley Strickland YKD:[id number] DOB: [birth year] DOA: 09/23/2013  Referring physician:PA: Marcene Brawn PCP: [name], MD   Chief Complaint: LLE swelling, erythema, and discomfort  HPI: Shirley Strickland is a 80 y.o. female  Presenting to the ED with the above complaints. Patient has history of recent fall on December 30 with subsequent left lower extremity wound. Patient has been followed by plastic surgeon. After routine evaluation patient was sent to the hospital for further evaluation for suspected left lower extremity DVT. The patient states that the leg became more swollen, erythematous, and uncomfortable today. The problem has persisted since onset and is not getting any better and as a result patient decided to go get an evaluation.  While in the ED PA evaluated patient and patient was subsequently placed on IV antibiotics. . We were subsequently consulted for further evaluation and recommendations  Review of Systems:  Constitutional:  No weight loss, night sweats, Fevers, chills, fatigue.  HEENT:  No headaches, Difficulty swallowing,Tooth/dental problems,Sore throat,  No sneezing, itching, ear ache, nasal congestion, post nasal drip,  Cardio-vascular:  No chest pain, Orthopnea, PND, swelling in lower extremities, anasarca, dizziness, palpitations  GI:  No heartburn, indigestion, abdominal pain, nausea, vomiting, diarrhea, change in bowel habits, loss of appetite  Resp:  No shortness of breath with exertion or at rest. No excess mucus, no productive cough, No non-productive cough, No coughing up of blood.No change in color of mucus.No wheezing.No chest wall deformity  Skin:  + erythema at lower extremity GU:  no dysuria, change in color of urine, no urgency or frequency. No flank pain.  Musculoskeletal:  Pain in left lower extremity,  No back pain.  Psych:  No change in mood or affect. No depression or anxiety. No memory loss.   Past  Medical History  Diagnosis Date  . 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 Cancer     pt states only skin cancer  . Numbness of legs     started after fall yesterday 07/03/11  . Bruise     bruise on left foot from fall 07/03/11  . Knee pain, acute 07/04/11    Pt states left knee twisted under her in fall 07/03/11 and now painful  . Rash and nonspecific skin eruption 07/04/11    rash on bilateral upper legs  . Syncope   . Fall   . HTN (hypertension)   . A-fib    Past Surgical History  Procedure Laterality Date  . Elbow surgery      right  . Appendectomy    . Exploratory laparotomy  07/04/11    years ago to find cause of bleeding   Social History:  reports that she has never smoked. She has never used smokeless tobacco. She reports that she drinks about 1.2 ounces of alcohol per week. She reports that she does not use illicit drugs.  Allergies  Allergen Reactions  . Clarithromycin Nausea Only  . [employer] Allergy] Other (See Comments)    Stomach pains and passed out recently - doesn't remember reaction before this weekend  . Codeine Nausea And Vomiting  . Darvocet [Propoxyphene N-Acetaminophen] Nausea And Vomiting  . Demadex [Torsemide] Hives and Nausea And Vomiting    UNKNOWN  . Diovan Hct [Valsartan-Hydrochlorothiazide] Other (See Comments)    unknown  . Eggs Or Egg-Derived Products Nausea And Vomiting  . Ex-Lax [Senna] Other (See Comments)    unknown  . Ibuprofen Nausea Only  . Indanediones  Other (See Comments)    unknown  . Indapamide Other (See Comments)    unknown  . Morphine And Related Nausea And Vomiting  . Sulfa Antibiotics Other (See Comments)    Couldn't breathe  . Benzonatate Itching and Rash    Eyes swelled  . Penicillins Rash  . Septra [Bactrim] Rash    No family history on file.   Prior to Admission medications   Medication Sig Start Date End Date Taking? Authorizing Provider  calcium-vitamin D (OSCAL WITH D) 500-200 MG-UNIT per tablet Take 2  tablets by mouth daily.   Yes Historical Provider, MD  cholecalciferol (VITAMIN D) 1000 UNITS tablet Take 1,000 Units by mouth daily.    Yes Historical Provider, MD  furosemide (LASIX) 20 MG tablet Take 0.5 tablets (10 mg total) by mouth every other day as needed for fluid or edema. 08/29/13  Yes Kinnie Feil, MD  loteprednol (LOTEMAX) 0.5 % ophthalmic suspension Place 1 drop into both eyes daily as needed (itching).   Yes Historical Provider, MD  metoprolol tartrate (LOPRESSOR) 12.5 mg TABS tablet Take 0.5 tablets (12.5 mg total) by mouth 2 (two) times daily. 08/29/13  Yes Kinnie Feil, MD  Polyethyl Glycol-Propyl Glycol (SYSTANE OP) Place 1 drop into both eyes daily.   Yes Historical Provider, MD  telmisartan (MICARDIS) 80 MG tablet Take 80 mg by mouth daily.     Yes Historical Provider, MD   Physical Exam: Filed Vitals:   09/24/13 0000  BP: 137/42  Pulse: 73  Temp:   Resp:     BP 137/42  Pulse 73  Temp(Src) 98.9 F (37.2 C) (Oral)  Resp 20  SpO2 97%  General:  Appears calm and comfortable Eyes: PERRL, normal lids, irises & conjunctiva ENT: grossly normal hearing, lips & tongue Neck: no LAD, masses or thyromegaly Cardiovascular: RRR, no m/r/g. No LE edema. Respiratory: CTA bilaterally, no w/r/r. Normal respiratory effort. Abdomen: soft, ntnd Skin: Erythema at left lower extremity that extends up to groin, left lower extremity edema when compared to right Musculoskeletal: Discomfort with palpation of left lower extremity, no cyanosis Psychiatric: grossly normal mood and affect, speech fluent and appropriate Neurologic: Patient answers questions appropriately, no facial symmetry, moves all extremities, sensation to light touch at toes intact           Labs on Admission:  Basic Metabolic Panel:  Recent Labs Lab 09/23/13 2138  NA 133*  K 5.1  CL 95*  CO2 25  GLUCOSE 145*  BUN 29*  CREATININE 1.28*  CALCIUM 9.2   Liver Function Tests:  Recent Labs Lab  09/23/13 2138  AST 10  ALT 8  ALKPHOS 82  BILITOT 0.3  PROT 6.1  ALBUMIN 2.5*   No results found for this basename: LIPASE, AMYLASE,  in the last 168 hours No results found for this basename: AMMONIA,  in the last 168 hours CBC:  Recent Labs Lab 09/23/13 2138  WBC 15.0*  NEUTROABS 10.8*  HGB 9.5*  HCT 29.1*  MCV 84.6  PLT 241   Cardiac Enzymes: No results found for this basename: CKTOTAL, CKMB, CKMBINDEX, TROPONINI,  in the last 168 hours  BNP (last 3 results)  Recent Labs  08/25/13 0427  PROBNP 3100.0*   CBG: No results found for this basename: GLUCAP,  in the last 168 hours  Radiological Exams on Admission: No results found.  EKG: Independently reviewed. Sinus tachycardia with no ST elevations or depressions  Assessment/Plan Principle problem: Cellulitis vs LLE DVT -  We'll plan on lower extremity duplex scan next a.m. at this juncture we'll treat - Should DVT come back negative can discontinue Lovenox - Monitor in telemetry - Given the multiple drug allergies we'll cover with doxycycline IV. Should patient's symptoms be secondary to new DVT then may consider discontinuing antibiotics  Active Problems:   A-fib - Will continue beta blocker - Currently rate controlled - Not on Coumadin at home most likely suspecting secondary to recent history of falls, at this juncture will be placed on Lovenox for possible VTE (pharmacy to dose)    HTN (hypertension) - Continue beta blocker - Monitor blood pressures    Chronic diastolic heart failure - Compensated we'll continue beta blocker - Given elevated BUN and creatinine ratio will hold Lasix   Code Status: full Family Communication: Discussed with daughter at bedside Disposition Plan: Telemetry  Time spent: > 50 minutes  Velvet Bathe [facility] Pager [phone]

## 2013-09-24 NOTE — Progress Notes (Signed)
[facility]                                                                              Patient Demographics  Shirley Strickland, is a 80 y.o. female, DOB - 05-Nov-1915, OEU:[id number]  Admit date - 09/23/2013   Admitting Physician Velvet Bathe, MD  Outpatient Primary MD for the patient is [name], MD  LOS - 1   Chief Complaint  Patient presents with  . Leg Pain  . Leg Swelling        Assessment & Plan   Left lower extremity swelling secondary to Cellulitis vs LLE DVT  -Pending lower ext doppler to rule out DVT -Will continue Lovenox, may discontinue if duplex is negative -Cellulitis: multiple drug allergies, will continue IV doxycycline    A-fib  -Currently rate controlled, continue metoprolol -Not on Coumadin at home most likely suspecting secondary to recent history of falls, at this juncture continue Lovenox for possible VTE (pharmacy to dose)   HTN (hypertension)  -Continue metoprolol and add medications if necessary  Chronic diastolic heart failure  -Compensated -Lasix held due to Creatinine  AKI -Likely secondary to lasix -Will continue to monitor  Leukocytosis -Likely reactive vs secondary to cellulitis, patient is afebrile -Will continue to monitor  Code Status: Full  Family Communication: None at bedside.   Disposition Plan: Admitted.  Time Spent in minutes   30 minutes  Procedures None  Consults  None  DVT Prophylaxis  Lovenox   Lab Results  Component Value Date   PLT 218 09/24/2013    Medications  Scheduled Meds: . doxycycline (VIBRAMYCIN) IV  100 mg Intravenous Q12H  . [START ON 09/25/2013] enoxaparin (LOVENOX) injection  60 mg Subcutaneous Q24H  . metoprolol tartrate  12.5 mg Oral BID  . sodium chloride  3 mL Intravenous Q12H   Continuous Infusions: . sodium chloride 50 mL/hr at 09/24/13 0148   PRN Meds:.acetaminophen, acetaminophen, loteprednol, ondansetron (ZOFRAN) IV, ondansetron  Antibiotics    Anti-infectives     Start     Dose/Rate Route Frequency Ordered Stop   09/24/13 1200  doxycycline (VIBRAMYCIN) 100 mg in dextrose 5 % 250 mL IVPB     100 mg 125 mL/hr over 120 Minutes Intravenous Every 12 hours 09/24/13 0120     09/24/13 0145  doxycycline (VIBRAMYCIN) 100 mg in dextrose 5 % 250 mL IVPB     100 mg 125 mL/hr over 120 Minutes Intravenous  Once 09/24/13 0138 09/24/13 0430   09/23/13 2330  vancomycin (VANCOCIN) IVPB 1000 mg/200 mL premix  Status:  Discontinued     1,000 mg 200 mL/hr over 60 Minutes Intravenous  Once 09/23/13 2320 09/24/13 0120   09/23/13 2330  cefTRIAXone (ROCEPHIN) 1 g in dextrose 5 % 50 mL IVPB  Status:  Discontinued     1 g 100 mL/hr over 30 Minutes Intravenous Every 24 hours 09/23/13 2320 09/24/13 0120        Subjective:   Jase Himmelberger seen and examined today.  Patient complains of left leg pain and swelling.  She has no other complaints at this time.  Objective:   Filed Vitals:   09/24/13 0030 09/24/13 0100 09/24/13 0230 09/24/13 1540  BP: 146/50   142/46  Pulse: 74  71 73  Temp: 98.2 F (36.8 C)   97.6 F (36.4 C)  TempSrc:      Resp: 18   16  Height:  5' 6.14" (1.68 m)    Weight:  64.7 kg (142 lb 10.2 oz)    SpO2: 97%   100%    Wt Readings from Last 3 Encounters:  09/24/13 64.7 kg (142 lb 10.2 oz)  08/26/13 64.7 kg (142 lb 10.2 oz)  07/04/11 61.6 kg (135 lb 12.9 oz)    No intake or output data in the 24 hours ending 09/24/13 0745  Exam  General: Well developed, well nourished, NAD, appears stated age  HEENT: NCAT, PERRLA, EOMI, Anicteic Sclera, mucous membranes moist. No pharyngeal erythema or exudates  Neck: Supple, no JVD, no masses  Cardiovascular: S1 S2 auscultated. Irregular.  Respiratory: Clear to auscultation bilaterally with equal chest rise  Abdomen: Soft, nontender, nondistended, + bowel sounds  Extremities: warm dry without cyanosis clubbing. Left leg wrapped   Neuro: AAOx3, cranial nerves grossly intact.   Skin: Without  rashes exudates or nodules  Psych: Normal affect and demeanor with intact judgement and insight   Data Review   Micro Results No results found for this or any previous visit (from the past 240 hour(s)).  Radiology Reports No results found.  CBC  Recent Labs Lab 09/23/13 2138 09/24/13 0538  WBC 15.0* 14.5*  HGB 9.5* 9.2*  HCT 29.1* 26.8*  PLT 241 218  MCV 84.6 84.3  MCH 27.6 28.9  MCHC 32.6 34.3  RDW 14.4 14.4  LYMPHSABS 2.0  --   MONOABS 1.8*  --   EOSABS 0.4  --   BASOSABS 0.0  --     Chemistries   Recent Labs Lab 09/23/13 2138 09/24/13 0538  NA 133* 133*  K 5.1 5.0  CL 95* 96  CO2 25 23  GLUCOSE 145* 116*  BUN 29* 29*  CREATININE 1.28* 1.27*  CALCIUM 9.2 8.8  AST 10  --   ALT 8  --   ALKPHOS 82  --   BILITOT 0.3  --    ------------------------------------------------------------------------------------------------------------------ estimated creatinine clearance is 24.9 ml/min (by C-G formula based on Cr of 1.27). ------------------------------------------------------------------------------------------------------------------ No results found for this basename: HGBA1C,  in the last 72 hours ------------------------------------------------------------------------------------------------------------------ No results found for this basename: CHOL, HDL, LDLCALC, TRIG, CHOLHDL, LDLDIRECT,  in the last 72 hours ------------------------------------------------------------------------------------------------------------------ No results found for this basename: TSH, T4TOTAL, FREET3, T3FREE, THYROIDAB,  in the last 72 hours ------------------------------------------------------------------------------------------------------------------ No results found for this basename: VITAMINB12, FOLATE, FERRITIN, TIBC, IRON, RETICCTPCT,  in the last 72 hours  Coagulation profile  Recent Labs Lab 09/23/13 2323  INR 1.01    No results found for this basename:  DDIMER,  in the last 72 hours  Cardiac Enzymes No results found for this basename: CK, CKMB, TROPONINI, MYOGLOBIN,  in the last 168 hours ------------------------------------------------------------------------------------------------------------------ No components found with this basename: POCBNP,     Markeem Noreen D.O. on 09/24/2013 at 7:45 AM  Between 7am to 7pm - Pager - [phone]  After 7pm go to [email or web address] - password [email or web address]  And look for the night coverage person covering for me after hours  [facility] Office  [phone]

## 2013-09-25 DIAGNOSIS — I82409 Acute embolism and thrombosis of unspecified deep veins of unspecified lower extremity: Secondary | ICD-10-CM

## 2013-09-25 LAB — BASIC METABOLIC PANEL
BUN: 24 mg/dL — ABNORMAL HIGH (ref 6–23)
CHLORIDE: 97 meq/L (ref 96–112)
CO2: 24 mEq/L (ref 19–32)
Calcium: 8.3 mg/dL — ABNORMAL LOW (ref 8.4–10.5)
Creatinine, Ser: 1.11 mg/dL — ABNORMAL HIGH (ref 0.50–1.10)
GFR calc non Af Amer: 41 mL/min — ABNORMAL LOW (ref >90)
GFR, EST AFRICAN AMERICAN: 47 mL/min — AB (ref >90)
GLUCOSE: 108 mg/dL — AB (ref 70–99)
POTASSIUM: 4.9 meq/L (ref 3.7–5.3)
SODIUM: 131 meq/L — AB (ref 137–147)

## 2013-09-25 LAB — CBC
HCT: 24.3 % — ABNORMAL LOW (ref 36.0–46.0)
Hemoglobin: 8.2 g/dL — ABNORMAL LOW (ref 12.0–15.0)
MCH: 28.3 pg (ref 26.0–34.0)
MCHC: 33.7 g/dL (ref 30.0–36.0)
MCV: 83.8 fL (ref 78.0–100.0)
Platelets: 206 10*3/uL (ref 150–400)
RBC: 2.9 MIL/uL — ABNORMAL LOW (ref 3.87–5.11)
RDW: 14.3 % (ref 11.5–15.5)
WBC: 13.2 10*3/uL — ABNORMAL HIGH (ref 4.0–10.5)

## 2013-09-25 MED ORDER — WARFARIN SODIUM 2.5 MG PO TABS
2.5000 mg | ORAL_TABLET | Freq: Once | ORAL | Status: AC
  Administered 2013-09-25: 2.5 mg via ORAL
  Filled 2013-09-25: qty 1

## 2013-09-25 MED ORDER — COUMADIN BOOK
Freq: Once | Status: AC
  Administered 2013-09-25: 18:00:00
  Filled 2013-09-25: qty 1

## 2013-09-25 MED ORDER — WARFARIN VIDEO
Freq: Once | Status: AC
  Administered 2013-09-25: 16:00:00

## 2013-09-25 MED ORDER — WARFARIN - PHARMACIST DOSING INPATIENT: Freq: Every day | Status: DC

## 2013-09-25 NOTE — Progress Notes (Signed)
ANTICOAGULATION CONSULT NOTE - Initial Consult  Pharmacy Consult for Coumadin Indication: DVT  Allergies  Allergen Reactions  . Clarithromycin Nausea Only  . [employer] Allergy] Other (See Comments)    Stomach pains and passed out recently - doesn't remember reaction before this weekend  . Codeine Nausea And Vomiting  . Darvocet [Propoxyphene N-Acetaminophen] Nausea And Vomiting  . Demadex [Torsemide] Hives and Nausea And Vomiting    UNKNOWN  . Diovan Hct [Valsartan-Hydrochlorothiazide] Other (See Comments)    unknown  . Eggs Or Egg-Derived Products Nausea And Vomiting  . Ex-Lax [Senna] Other (See Comments)    unknown  . Ibuprofen Nausea Only  . Indanediones Other (See Comments)    unknown  . Indapamide Other (See Comments)    unknown  . Morphine And Related Nausea And Vomiting  . Sulfa Antibiotics Other (See Comments)    Couldn't breathe  . Benzonatate Itching and Rash    Eyes swelled  . Penicillins Rash  . Septra [Bactrim] Rash    Patient Measurements: Height: 5' 6.14" (168 cm) Weight: 142 lb 10.2 oz (64.7 kg) IBW/kg (Calculated) : 59.63  Vital Signs: Temp: 98 F (36.7 C) (02/01 0618) BP: 127/42 mmHg (02/01 0618) Pulse Rate: 72 (02/01 0618)  Labs:  Recent Labs  09/23/13 2138 09/23/13 2323 09/24/13 0538 09/25/13 0413  HGB 9.5*  --  9.2* 8.2*  HCT 29.1*  --  26.8* 24.3*  PLT 241  --  218 206  APTT  --  33  --   --   LABPROT  --  13.1  --   --   INR  --  1.01  --   --   CREATININE 1.28*  --  1.27* 1.11*    Estimated Creatinine Clearance: 28.5 ml/min (by C-G formula based on Cr of 1.11).   Medical History: Past Medical History  Diagnosis Date  . 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 Cancer     pt states only skin cancer  . Numbness of legs     started after fall yesterday 07/03/11  . Bruise     bruise on left foot from fall 07/03/11  . Knee pain, acute 07/04/11    Pt states left knee twisted under her in fall 07/03/11 and now painful  . Rash  and nonspecific skin eruption 07/04/11    rash on bilateral upper legs  . Syncope   . Fall   . HTN (hypertension)   . A-fib     Medications:  Prescriptions prior to admission  Medication Sig Dispense Refill  . calcium-vitamin D (OSCAL WITH D) 500-200 MG-UNIT per tablet Take 2 tablets by mouth daily.      . cholecalciferol (VITAMIN D) 1000 UNITS tablet Take 1,000 Units by mouth daily.       . furosemide (LASIX) 20 MG tablet Take 0.5 tablets (10 mg total) by mouth every other day as needed for fluid or edema.  30 tablet    . loteprednol (LOTEMAX) 0.5 % ophthalmic suspension Place 1 drop into both eyes daily as needed (itching).      . metoprolol tartrate (LOPRESSOR) 12.5 mg TABS tablet Take 0.5 tablets (12.5 mg total) by mouth 2 (two) times daily.      Vladimir Faster Glycol-Propyl Glycol (SYSTANE OP) Place 1 drop into both eyes daily.      Marland Kitchen telmisartan (MICARDIS) 80 MG tablet Take 80 mg by mouth daily.         Scheduled:  . coumadin book  Does not apply Once  . doxycycline (VIBRAMYCIN) IV  100 mg Intravenous Q12H  . enoxaparin (LOVENOX) injection  65 mg Subcutaneous Q24H  . metoprolol tartrate  12.5 mg Oral BID  . sodium chloride  3 mL Intravenous Q12H  . warfarin  2.5 mg Oral ONCE-1800  . warfarin   Does not apply Once  . Warfarin - Pharmacist Dosing Inpatient   Does not apply q1800    Assessment: 80yo female now w/ confirmed DVT to begin Coumadin.  Goal of Therapy:  INR 2-3   Plan:  Will give Coumadin 2.5mg  x1 today and monitor INR for dose adjustments and begin Coumadin education.  Wynona Neat, PharmD, BCPS  09/25/2013,7:41 AM

## 2013-09-25 NOTE — ED Provider Notes (Signed)
Medical screening examination/treatment/procedure(s) were performed by non-physician practitioner and as supervising physician I was immediately available for consultation/collaboration.     Whalen Trompeter R. Saquoia Sianez, MD 09/25/13 0733 

## 2013-09-25 NOTE — Progress Notes (Signed)
Patient known to me from previous admission and I have been following her LLE as outpatient. Events to date noted.  Plan placement A Cell substrate to wound LLE at bedside am 09/26/13. Supplies ordered to bedside. Will write new wound care orders following this.  Irene Limbo, MD [facility]astic [facility] [phone]

## 2013-09-25 NOTE — Progress Notes (Signed)
[facility]                                                                              Patient Demographics  Shirley Strickland, is a 80 y.o. female, DOB - 1915-08-30, BOF:[id number]  Admit date - 09/23/2013   Admitting Physician Velvet Bathe, MD  Outpatient Primary MD for the patient is [name], MD  LOS - 2   Chief Complaint  Patient presents with  . Leg Pain  . Leg Swelling        Assessment & Plan   Left lower extremity swelling secondary to Cellulitis vs LLE DVT  -LE duplex: acute deep vein thrombosis involving the left common femoral vein, left femoral vein, left popliteal vein, and left posterial tibial vein. -Will continue Lovenox, will start coumadin with pharmacy to dose and follow -Cellulitis: multiple drug allergies, will continue IV doxycycline    A-fib  -Currently rate controlled, continue metoprolol -Not on Coumadin at home most likely suspecting secondary to recent history of falls, at this juncture continue Lovenox for possible VTE (pharmacy to dose)  -Spoke with niece regarding coumadin and need for supervision to avoid falls  HTN (hypertension)  -Continue metoprolol and add medications if necessary   Chronic diastolic heart failure  -Compensated -Lasix held due to Creatinine  AKI -Cr 1.11, improving -Likely secondary to lasix -Will continue to monitor  Leukocytosis -Likely reactive vs secondary to cellulitis, patient is afebrile -Will continue to monitor  Code Status: Full  Family Communication: Niece via phone.   Disposition Plan: Admitted.  Time Spent in minutes   25 minutes  Procedures  Lower Extremity Duplex Summary: - Findings consistent with acute deep vein thrombosis involving the left common femoral vein, left femoral vein, left popliteal vein, and left posterial tibial vein. - No evidence of Baker's cyst on the left.  Consults  None  DVT Prophylaxis  Lovenox   Lab Results  Component Value Date   PLT 206  09/25/2013    Medications  Scheduled Meds: . coumadin book   Does not apply Once  . doxycycline (VIBRAMYCIN) IV  100 mg Intravenous Q12H  . enoxaparin (LOVENOX) injection  65 mg Subcutaneous Q24H  . metoprolol tartrate  12.5 mg Oral BID  . sodium chloride  3 mL Intravenous Q12H  . warfarin  2.5 mg Oral ONCE-1800  . warfarin   Does not apply Once  . Warfarin - Pharmacist Dosing Inpatient   Does not apply q1800   Continuous Infusions:   PRN Meds:.acetaminophen, acetaminophen, loteprednol, ondansetron (ZOFRAN) IV, ondansetron  Antibiotics    Anti-infectives   Start     Dose/Rate Route Frequency Ordered Stop   09/24/13 1200  doxycycline (VIBRAMYCIN) 100 mg in dextrose 5 % 250 mL IVPB     100 mg 125 mL/hr over 120 Minutes Intravenous Every 12 hours 09/24/13 0120     09/24/13 0145  doxycycline (VIBRAMYCIN) 100 mg in dextrose 5 % 250 mL IVPB     100 mg 125 mL/hr over 120 Minutes Intravenous  Once 09/24/13 0138 09/24/13 0430   09/23/13 2330  vancomycin (VANCOCIN) IVPB 1000 mg/200 mL premix  Status:  Discontinued     1,000 mg 200  mL/hr over 60 Minutes Intravenous  Once 09/23/13 2320 09/24/13 0120   09/23/13 2330  cefTRIAXone (ROCEPHIN) 1 g in dextrose 5 % 50 mL IVPB  Status:  Discontinued     1 g 100 mL/hr over 30 Minutes Intravenous Every 24 hours 09/23/13 2320 09/24/13 0120        Subjective:   Oakley Orban seen and examined today.  Patient states that her left leg still hurts, however, no other complaints.    Objective:   Filed Vitals:   09/24/13 0526 09/24/13 1323 09/24/13 2200 09/25/13 0618  BP: 142/46 134/44 134/60 127/42  Pulse: 73 72 73 72  Temp: 97.6 F (36.4 C) 98.3 F (36.8 C) 98.1 F (36.7 C) 98 F (36.7 C)  TempSrc:      Resp: 16 16 17 16   Height:      Weight:      SpO2: 100% 97% 96% 95%    Wt Readings from Last 3 Encounters:  09/24/13 64.7 kg (142 lb 10.2 oz)  08/26/13 64.7 kg (142 lb 10.2 oz)  07/04/11 61.6 kg (135 lb 12.9 oz)     Intake/Output  Summary (Last 24 hours) at 09/25/13 0920 Last data filed at 09/25/13 0830  Gross per 24 hour  Intake   1903 ml  Output      0 ml  Net   1903 ml    Exam  General: Well developed, well nourished, NAD, appears stated age  HEENT: NCAT, PERRLA, EOMI, Anicteic Sclera, mucous membranes moist. No pharyngeal erythema or exudates  Neck: Supple, no JVD, no masses  Cardiovascular: S1 S2 auscultated. Irregular.  Respiratory: Clear to auscultation bilaterally with equal chest rise  Abdomen: Soft, nontender, nondistended, + bowel sounds  Extremities: warm dry without cyanosis clubbing. Left leg wrapped   Neuro: AAOx3, cranial nerves grossly intact.   Skin: Without rashes exudates or nodules  Psych: Normal affect and demeanor with intact judgement and insight   Data Review   Micro Results No results found for this or any previous visit (from the past 240 hour(s)).  Radiology Reports No results found.  CBC  Recent Labs Lab 09/23/13 2138 09/24/13 0538 09/25/13 0413  WBC 15.0* 14.5* 13.2*  HGB 9.5* 9.2* 8.2*  HCT 29.1* 26.8* 24.3*  PLT 241 218 206  MCV 84.6 84.3 83.8  MCH 27.6 28.9 28.3  MCHC 32.6 34.3 33.7  RDW 14.4 14.4 14.3  LYMPHSABS 2.0  --   --   MONOABS 1.8*  --   --   EOSABS 0.4  --   --   BASOSABS 0.0  --   --     Chemistries   Recent Labs Lab 09/23/13 2138 09/24/13 0538 09/25/13 0413  NA 133* 133* 131*  K 5.1 5.0 4.9  CL 95* 96 97  CO2 25 23 24   GLUCOSE 145* 116* 108*  BUN 29* 29* 24*  CREATININE 1.28* 1.27* 1.11*  CALCIUM 9.2 8.8 8.3*  AST 10  --   --   ALT 8  --   --   ALKPHOS 82  --   --   BILITOT 0.3  --   --    ------------------------------------------------------------------------------------------------------------------ estimated creatinine clearance is 28.5 ml/min (by C-G formula based on Cr of 1.11). ------------------------------------------------------------------------------------------------------------------ No results found  for this basename: HGBA1C,  in the last 72 hours ------------------------------------------------------------------------------------------------------------------ No results found for this basename: CHOL, HDL, LDLCALC, TRIG, CHOLHDL, LDLDIRECT,  in the last 72 hours ------------------------------------------------------------------------------------------------------------------ No results found for this basename: TSH, T4TOTAL, FREET3,  T3FREE, THYROIDAB,  in the last 72 hours ------------------------------------------------------------------------------------------------------------------ No results found for this basename: VITAMINB12, FOLATE, FERRITIN, TIBC, IRON, RETICCTPCT,  in the last 72 hours  Coagulation profile  Recent Labs Lab 09/23/13 2323  INR 1.01    No results found for this basename: DDIMER,  in the last 72 hours  Cardiac Enzymes No results found for this basename: CK, CKMB, TROPONINI, MYOGLOBIN,  in the last 168 hours ------------------------------------------------------------------------------------------------------------------ No components found with this basename: POCBNP,     Aeryn Medici D.O. on 09/25/2013 at 9:20 AM  Between 7am to 7pm - Pager - [phone]  After 7pm go to [email or web address] - password [email or web address]  And look for the night coverage person covering for me after hours  [facility] Office  [phone]

## 2013-09-25 NOTE — Evaluation (Signed)
Physical Therapy Evaluation Patient Details Name: Shirley Strickland MRN: [id number] DOB: 11/25/1915 Today's Date: 09/25/2013 Time: 4098-1191 PT Time Calculation (min): 38 min  PT Assessment / Plan / Recommendation History of Present Illness  Admitted with LLE Cellulitis (wound from fall Dec 30th), DVTs LLE  Clinical Impression  Pt admitted with above. Pt currently with functional limitations due to the deficits listed below (see PT Problem List).  Pt will benefit from skilled PT to increase their independence and safety with mobility to allow discharge to the venue listed below.       PT Assessment  Patient needs continued PT services    Follow Up Recommendations  SNF;Supervision/Assistance - 24 hour    Does the patient have the potential to tolerate intense rehabilitation      Barriers to Discharge        Equipment Recommendations  Rolling walker with 5" wheels;3in1 (PT)    Recommendations for Other Services     Frequency Min 3X/week    Precautions / Restrictions Precautions Precautions: Fall   Pertinent Vitals/Pain Pain increases with movement of LLE elevated for edema and pain control patient repositioned for comfort       Mobility  Bed Mobility Overal bed mobility: Needs Assistance Bed Mobility: Supine to Sit Supine to sit: Min assist General bed mobility comments: Cues for technique Transfers Overall transfer level: Needs assistance Equipment used:  Charlaine Dalton) Transfers: Sit to/from Stand Sit to Stand: Min assist;Mod assist General transfer comment: cues for technique; Stood to [place] with pulling up; Decr control with stand to sit, requiring +2 Ambulation/Gait Ambulation/Gait assistance:  (deferred to next session)    Exercises     PT Diagnosis: Difficulty walking;Acute pain  PT Problem List: Decreased strength;Decreased range of motion;Decreased activity tolerance;Decreased balance;Decreased mobility;Decreased knowledge of use of DME;Decreased knowledge of  precautions;Pain PT Treatment Interventions: DME instruction;Gait training;Functional mobility training;Stair training;Therapeutic activities;Therapeutic exercise;Balance training;Patient/family education     PT Goals(Current goals can be found in the care plan section) Acute Rehab PT Goals Patient Stated Goal: Eventually get home PT Goal Formulation: With patient Time For Goal Achievement: 10/09/13 Potential to Achieve Goals: Good  Visit Information  Last PT Received On: 09/25/13 Assistance Needed: +1 History of Present Illness: Admitted with LLE Cellulitis (wound from fall Dec 30th), DVTs LLE       Prior Functioning  Home Living Family/patient expects to be discharged to:: Skilled nursing facility Living Arrangements: Alone Additional Comments: was admitted from [place]: Needs assistance Gait / Transfers Assistance Needed: amb with staff at [place] [facility] [place]: [place]: Awake/alert Behavior During Therapy: WFL for tasks assessed/performed Overall Cognitive Status: Within Functional Limits for tasks assessed    Extremity/Trunk Assessment Upper Extremity Assessment Upper Extremity Assessment: Overall WFL for tasks assessed Lower Extremity Assessment Lower Extremity Assessment: Generalized weakness;LLE deficits/detail LLE Deficits / Details: Quite sensitive to any touch; Painful with motion, but able to move LLE against gravity LLE: Unable to fully assess due to pain LLE Sensation: history of peripheral neuropathy   Balance    End of Session PT - End of Session Equipment Utilized During Treatment: Other (comment) Charlaine Dalton) Activity Tolerance: Patient tolerated treatment well Patient left: in chair;with call bell/phone within reach;with family/visitor present Nurse Communication: Mobility status  GP     Quin Hoop [place], [place]  09/25/2013,  3:53 PM

## 2013-09-26 ENCOUNTER — Inpatient Hospital Stay (HOSPITAL_COMMUNITY): Payer: Medicare Other

## 2013-09-26 ENCOUNTER — Encounter (HOSPITAL_COMMUNITY): Payer: Self-pay | Admitting: Radiology

## 2013-09-26 LAB — BASIC METABOLIC PANEL
BUN: 21 mg/dL (ref 6–23)
CHLORIDE: 94 meq/L — AB (ref 96–112)
CO2: 22 mEq/L (ref 19–32)
Calcium: 8.1 mg/dL — ABNORMAL LOW (ref 8.4–10.5)
Creatinine, Ser: 1.01 mg/dL (ref 0.50–1.10)
GFR calc non Af Amer: 46 mL/min — ABNORMAL LOW (ref >90)
GFR, EST AFRICAN AMERICAN: 53 mL/min — AB (ref >90)
Glucose, Bld: 104 mg/dL — ABNORMAL HIGH (ref 70–99)
POTASSIUM: 4.4 meq/L (ref 3.7–5.3)
Sodium: 129 mEq/L — ABNORMAL LOW (ref 137–147)

## 2013-09-26 LAB — FOLATE: Folate: 15.8 ng/mL

## 2013-09-26 LAB — CBC
HEMATOCRIT: 23 % — AB (ref 36.0–46.0)
Hemoglobin: 7.7 g/dL — ABNORMAL LOW (ref 12.0–15.0)
MCH: 27.9 pg (ref 26.0–34.0)
MCHC: 33.5 g/dL (ref 30.0–36.0)
MCV: 83.3 fL (ref 78.0–100.0)
PLATELETS: 201 10*3/uL (ref 150–400)
RBC: 2.76 MIL/uL — ABNORMAL LOW (ref 3.87–5.11)
RDW: 14.1 % (ref 11.5–15.5)
WBC: 12.2 10*3/uL — AB (ref 4.0–10.5)

## 2013-09-26 LAB — SURGICAL PCR SCREEN
MRSA, PCR: NEGATIVE
Staphylococcus aureus: NEGATIVE

## 2013-09-26 LAB — RETICULOCYTES
RBC.: 3 MIL/uL — ABNORMAL LOW (ref 3.87–5.11)
Retic Count, Absolute: 36 10*3/uL (ref 19.0–186.0)
Retic Ct Pct: 1.2 % (ref 0.4–3.1)

## 2013-09-26 LAB — PROTIME-INR
INR: 1.21 (ref 0.00–1.49)
Prothrombin Time: 15 seconds (ref 11.6–15.2)

## 2013-09-26 LAB — VITAMIN B12: Vitamin B-12: 288 pg/mL (ref 211–911)

## 2013-09-26 LAB — FERRITIN: Ferritin: 206 ng/mL (ref 10–291)

## 2013-09-26 MED ORDER — WARFARIN SODIUM 2 MG PO TABS
2.0000 mg | ORAL_TABLET | Freq: Once | ORAL | Status: DC
  Filled 2013-09-26: qty 1

## 2013-09-26 NOTE — Consult Note (Signed)
HPI: Shirley Strickland is an 80 y.o. female who has been admitted with LLE cellulitis and DVT. She has been placed on Lovenox. Prior hx of Afib but not on chronic anticoagulation. Since admission, Hgb has slowly trended down. Some concern for possible spont bleed secondary to anticoagulation. CT abd finds no evidence of intraperitoneal or retroperitoneal bleed/hematoma. Further review of history reveals pt at significant fall risk. Primary MD has d/w Dr. Kathlene Cote and feel this pt has a reasonable indication to place an IVC filter. Family at bedside. Chart and current meds reviewed. Not listed in chart, but pt and family report 'dye' allergy  Past Medical History:  Past Medical History  Diagnosis Date  . 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 Cancer     pt states only skin cancer  . Numbness of legs     started after fall yesterday 07/03/11  . Bruise     bruise on left foot from fall 07/03/11  . Knee pain, acute 07/04/11    Pt states left knee twisted under her in fall 07/03/11 and now painful  . Rash and nonspecific skin eruption 07/04/11    rash on bilateral upper legs  . Syncope   . Fall   . HTN (hypertension)   . A-fib     Past Surgical History:  Past Surgical History  Procedure Laterality Date  . Elbow surgery      right  . Appendectomy    . Exploratory laparotomy  07/04/11    years ago to find cause of bleeding    Family History: History reviewed. No pertinent family history.  Social History:  reports that she has never smoked. She has never used smokeless tobacco. She reports that she drinks about 1.2 ounces of alcohol per week. She reports that she does not use illicit drugs.  Allergies:  Allergies  Allergen Reactions  . Clarithromycin Nausea Only  . [employer] Allergy] Other (See Comments)    Stomach pains and passed out recently - doesn't remember reaction before this weekend  . Codeine Nausea And Vomiting  . Darvocet [Propoxyphene N-Acetaminophen] Nausea And  Vomiting  . Demadex [Torsemide] Hives and Nausea And Vomiting    UNKNOWN  . Diovan Hct [Valsartan-Hydrochlorothiazide] Other (See Comments)    unknown  . Eggs Or Egg-Derived Products Nausea And Vomiting  . Ex-Lax [Senna] Other (See Comments)    unknown  . Ibuprofen Nausea Only  . Indanediones Other (See Comments)    unknown  . Indapamide Other (See Comments)    unknown  . Morphine And Related Nausea And Vomiting  . Sulfa Antibiotics Other (See Comments)    Couldn't breathe  . Benzonatate Itching and Rash    Eyes swelled  . Penicillins Rash  . Septra [Bactrim] Rash    Medications: Current facility-administered medications:acetaminophen (TYLENOL) suppository 650 mg, 650 mg, Rectal, Q6H PRN, Velvet Bathe, MD;  acetaminophen (TYLENOL) tablet 650 mg, 650 mg, Oral, Q6H PRN, Velvet Bathe, MD;  doxycycline (VIBRAMYCIN) 100 mg in dextrose 5 % 250 mL IVPB, 100 mg, Intravenous, Q12H, Velvet Bathe, MD, 100 mg at 09/26/13 1208 enoxaparin (LOVENOX) injection 65 mg, 65 mg, Subcutaneous, Q24H, Benjamine Sprague [place], RPH, 65 mg at 09/25/13 2358;  loteprednol (LOTEMAX) 0.5 % ophthalmic suspension 1 drop, 1 drop, Both Eyes, Daily PRN, Velvet Bathe, MD;  metoprolol tartrate (LOPRESSOR) tablet 12.5 mg, 12.5 mg, Oral, BID, Velvet Bathe, MD, 12.5 mg at 09/26/13 0923;  ondansetron (ZOFRAN) injection 4 mg, 4 mg, Intravenous, Q6H PRN, Linward Foster  Wendee Beavers, MD ondansetron [facility]) tablet 4 mg, 4 mg, Oral, Q6H PRN, Velvet Bathe, MD;  sodium chloride 0.9 % injection 3 mL, 3 mL, Intravenous, Q12H, Velvet Bathe, MD, 3 mL at 09/25/13 2200;  warfarin (COUMADIN) tablet 2 mg, 2 mg, Oral, ONCE-1800, Kendra P Hiatt, RPH;  Warfarin - Pharmacist Dosing Inpatient, , Does not apply, q1800, Rogue Bussing, RPH  Please HPI for pertinent positives, otherwise complete 10 system ROS negative.  Physical Exam: BP 153/40  Pulse 65  Temp(Src) 98.3 F (36.8 C) (Oral)  Resp 15  Ht 5' 6.14" (1.68 m)  Wt 142 lb 10.2 oz (64.7 kg)  BMI  22.92 kg/m2  SpO2 98% Body mass index is 22.92 kg/(m^2).   General Appearance:  Alert, cooperative, no distress, appears stated age  Head:  Normocephalic, without obvious abnormality, atraumatic  ENT: Unremarkable  Neck: Supple, symmetrical, trachea midline  Lungs:   Clear to auscultation bilaterally, no w/r/r, respirations unlabored without use of accessory muscles.  Heart:  Regular rate and rhythm, S1, S2 normal, no murmur, rub or gallop.  Abdomen:   Soft, non-tender, non distended.  Extremities: Extremities normal, atraumatic. LLE with edema  Pulses: 2+ and symmetric  Neurologic: Normal affect, no gross deficits.   Results for orders placed during the hospital encounter of 09/23/13 (from the past 48 hour(s))  CBC     Status: Abnormal   Collection Time    09/25/13  4:13 AM      Result Value Range   WBC 13.2 (*) 4.0 - 10.5 K/uL   RBC 2.90 (*) 3.87 - 5.11 MIL/uL   Hemoglobin 8.2 (*) 12.0 - 15.0 g/dL   HCT 24.3 (*) 36.0 - 46.0 %   MCV 83.8  78.0 - 100.0 fL   MCH 28.3  26.0 - 34.0 pg   MCHC 33.7  30.0 - 36.0 g/dL   RDW 14.3  11.5 - 15.5 %   Platelets 206  150 - 400 K/uL  BASIC METABOLIC PANEL     Status: Abnormal   Collection Time    09/25/13  4:13 AM      Result Value Range   Sodium 131 (*) 137 - 147 mEq/L   Potassium 4.9  3.7 - 5.3 mEq/L   Chloride 97  96 - 112 mEq/L   CO2 24  19 - 32 mEq/L   Glucose, Bld 108 (*) 70 - 99 mg/dL   BUN 24 (*) 6 - 23 mg/dL   Creatinine, Ser 1.11 (*) 0.50 - 1.10 mg/dL   Calcium 8.3 (*) 8.4 - 10.5 mg/dL   GFR calc non Af Amer 41 (*) >90 mL/min   GFR calc Af Amer 47 (*) >90 mL/min   Comment: (NOTE)     The eGFR has been calculated using the CKD EPI equation.     This calculation has not been validated in all clinical situations.     eGFR's persistently <90 mL/min signify possible Chronic Kidney     Disease.  PROTIME-INR     Status: None   Collection Time    09/26/13  5:56 AM      Result Value Range   Prothrombin Time 15.0  11.6 - 15.2  seconds   INR 1.21  0.00 - 1.49  CBC     Status: Abnormal   Collection Time    09/26/13  5:56 AM      Result Value Range   WBC 12.2 (*) 4.0 - 10.5 K/uL   RBC 2.76 (*) 3.87 - 5.11 MIL/uL  Hemoglobin 7.7 (*) 12.0 - 15.0 g/dL   HCT 23.0 (*) 36.0 - 46.0 %   MCV 83.3  78.0 - 100.0 fL   MCH 27.9  26.0 - 34.0 pg   MCHC 33.5  30.0 - 36.0 g/dL   RDW 14.1  11.5 - 15.5 %   Platelets 201  150 - 400 K/uL  BASIC METABOLIC PANEL     Status: Abnormal   Collection Time    09/26/13  5:56 AM      Result Value Range   Sodium 129 (*) 137 - 147 mEq/L   Potassium 4.4  3.7 - 5.3 mEq/L   Chloride 94 (*) 96 - 112 mEq/L   CO2 22  19 - 32 mEq/L   Glucose, Bld 104 (*) 70 - 99 mg/dL   BUN 21  6 - 23 mg/dL   Creatinine, Ser 1.01  0.50 - 1.10 mg/dL   Calcium 8.1 (*) 8.4 - 10.5 mg/dL   GFR calc non Af Amer 46 (*) >90 mL/min   GFR calc Af Amer 53 (*) >90 mL/min   Comment: (NOTE)     The eGFR has been calculated using the CKD EPI equation.     This calculation has not been validated in all clinical situations.     eGFR's persistently <90 mL/min signify possible Chronic Kidney     Disease.  VITAMIN B12     Status: None   Collection Time    09/26/13  7:45 AM      Result Value Range   Vitamin B-12 288  211 - 911 pg/mL   Comment: Performed at [facility]     Status: None   Collection Time    09/26/13  7:45 AM      Result Value Range   Folate 15.8     Comment: (NOTE)     Reference Ranges            Deficient:       0.4 - 3.3 ng/mL            Indeterminate:   3.4 - 5.4 ng/mL            Normal:              > 5.4 ng/mL     Performed at [facility]  FERRITIN     Status: None   Collection Time    09/26/13  7:45 AM      Result Value Range   Ferritin 206  10 - 291 ng/mL   Comment: Performed at [facility]     Status: Abnormal   Collection Time    09/26/13  7:45 AM      Result Value Range   Retic Ct Pct 1.2  0.4 - 3.1 %   RBC. 3.00 (*) 3.87 - 5.11  MIL/uL   Retic Count, Manual 36.0  19.0 - 186.0 K/uL   Ct Abdomen Pelvis Wo Contrast  09/26/2013   CLINICAL DATA:  Evaluate for retroperitoneal hemorrhage.  EXAM: CT ABDOMEN AND PELVIS WITHOUT CONTRAST  TECHNIQUE: Multidetector CT imaging of the abdomen and pelvis was performed following the standard protocol without intravenous contrast.  COMPARISON:  None.  FINDINGS: Areas of minimal dependent atelectasis are appreciated within the lung bases. There are also areas of minimal bibasilar pleural thickening. The lung bases otherwise unremarkable. Within the dome of the medial segment left lobe of the liver a 2 cm low attenuating focus is identified with  Hounsfield units of 2.9. The liver is otherwise unremarkable.  Bilateral cortical thinning is identified within the kidneys without evidence of hydronephrosis, hydroureter, nephrolithiasis, nor ureteral lithiasis.  Evaluation of pancreas demonstrates a 1.7 x 1.5 cm low attenuating nodule along the medial aspect of the head of the pancreas. This area demonstrates counsel units of 7.21. Image 24 series 5. Pancreas is otherwise unremarkable.  Evaluation of the spleen that demonstrates a calcified nodular focus within the apex of the splenic hilum. 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 Spleen is otherwise unremarkable.  There is no evidence of an abdominal aortic aneurysm. Atherosclerotic calcifications are appreciated within the aorta.  Within limitations of a noncontrast CT there is no evidence of abdominal or pelvic free fluid, loculated fluid collections nor evidence of an intraperitoneal nor retroperitoneal hematoma. Within the limitations of a noncontrast CT there is no evidence of bowel obstruction, enteritis, colitis, diverticulitis, North appendicitis. The urinary bladder is distended with urine. The pelvis is otherwise unremarkable. There is no evidence of pneumoperitoneum.  There is no evidence of aggressive appearing osseous lesions.  Multilevel degenerative disc disease changes appreciated within the lumbar spine. Areas of superior endplate central depression are appreciated involving L3 and L4. The bones are osteopenic.  There is no evidence of an abdominal wall nor inguinal hernia.  IMPRESSION: 1. No CT evidence of an intraperitoneal nor retroperitoneal hematoma. 2. Findings likely reflecting a small pseudocyst adjacent to the medial aspect of the pancreatic head. More ominous etiologies cannot be excluded. Further evaluation with pancreatic protocol contrasted CT is recommended. 3. Likely cysts within the left lobe of the liver 4. Atherosclerotic calcifications involving the aorta 5. Cortical thinning within the kidneys without further abnormalities 6. Multilevel spondylolysis within the lumbar spine.   Electronically Signed   By: Margaree Mackintosh M.D.   On: 09/26/2013 14:05    Assessment/Plan LLE DVT Chronic anemia-no evidence of acute bleed High risk for falls and therefore high risk for chronic anticoagulation. Discussed indication and role of IVC filter. Explained procedure, risks, complications, use of sedation. Labs reviewed. Consent signed in chart  Ascencion Dike PA-C 09/26/2013, 3:35 PM

## 2013-09-26 NOTE — Progress Notes (Signed)
[facility]                                                                              Patient Demographics  Shirley Strickland, is a 80 y.o. female, DOB - 1916/02/06, [id number]  Admit date - 09/23/2013   Admitting Physician Velvet Bathe, MD  Outpatient Primary MD for the patient is [name], MD  LOS - 3   Chief Complaint  Patient presents with  . Leg Pain  . Leg Swelling        Assessment & Plan   Left lower extremity swelling secondary to Cellulitis vs LLE DVT  -LE duplex: acute deep vein thrombosis involving the left common femoral vein, left femoral vein, left popliteal vein, and left posterial tibial vein. -Will continue Lovenox, will start coumadin with pharmacy to dose and follow -Cellulitis: multiple drug allergies, will continue IV doxycycline    Anemia -unknown source -patient recently started on lovenox and coumadin for DVT  -Will order anemia panel and FOBT -Spoke with IR regarding possible IVC filter instead of anticoagulation, will order CT abd/pelvis without contrast to assess for ?retroperitoneal bleed  A-fib  -Currently rate controlled, continue metoprolol -Not on Coumadin at home most likely suspecting secondary to recent history of falls, at this juncture continue Lovenox for possible VTE (pharmacy to dose)  -Spoke with niece regarding coumadin and need for supervision to avoid falls  HTN (hypertension)  -Continue metoprolol and add medications if necessary   Chronic diastolic heart failure  -Compensated -Lasix held due to Creatinine  AKI -Cr 1.01, improving -Likely secondary to lasix -Will continue to monitor  Leukocytosis -Likely reactive vs secondary to cellulitis, patient is afebrile -Will continue to monitor  Code Status: Full  Family Communication: Niece via phone.   Disposition Plan: Admitted.  Time Spent in minutes   25 minutes  Procedures  Lower Extremity Duplex Summary: - Findings consistent with acute  deep vein thrombosis involving the left common femoral vein, left femoral vein, left popliteal vein, and left posterial tibial vein. - No evidence of Baker's cyst on the left.  Consults   Interventional Radiology  DVT Prophylaxis  Lovenox   Lab Results  Component Value Date   PLT 201 09/26/2013    Medications  Scheduled Meds: . doxycycline (VIBRAMYCIN) IV  100 mg Intravenous Q12H  . enoxaparin (LOVENOX) injection  65 mg Subcutaneous Q24H  . metoprolol tartrate  12.5 mg Oral BID  . sodium chloride  3 mL Intravenous Q12H  . Warfarin - Pharmacist Dosing Inpatient   Does not apply q1800   Continuous Infusions:   PRN Meds:.acetaminophen, acetaminophen, loteprednol, ondansetron (ZOFRAN) IV, ondansetron  Antibiotics    Anti-infectives   Start     Dose/Rate Route Frequency Ordered Stop   09/24/13 1200  doxycycline (VIBRAMYCIN) 100 mg in dextrose 5 % 250 mL IVPB     100 mg 125 mL/hr over 120 Minutes Intravenous Every 12 hours 09/24/13 0120     09/24/13 0145  doxycycline (VIBRAMYCIN) 100 mg in dextrose 5 % 250 mL IVPB     100 mg 125 mL/hr over 120 Minutes Intravenous  Once 09/24/13 0138 09/24/13 0430   09/23/13 2330  vancomycin (VANCOCIN)  IVPB 1000 mg/200 mL premix  Status:  Discontinued     1,000 mg 200 mL/hr over 60 Minutes Intravenous  Once 09/23/13 2320 09/24/13 0120   09/23/13 2330  cefTRIAXone (ROCEPHIN) 1 g in dextrose 5 % 50 mL IVPB  Status:  Discontinued     1 g 100 mL/hr over 30 Minutes Intravenous Every 24 hours 09/23/13 2320 09/24/13 0120        Subjective:   Wilmoth Clohessy seen and examined today.  Patient states that her left leg still hurts, however, no other complaints.    Objective:   Filed Vitals:   09/25/13 0618 09/25/13 1457 09/25/13 2100 09/26/13 0441  BP: 127/42 140/47 137/42 137/40  Pulse: 72 72 65 70  Temp: 98 F (36.7 C) 98.4 F (36.9 C) 97.9 F (36.6 C) 99.8 F (37.7 C)  TempSrc:    Oral  Resp: 16 16 15 16   Height:      Weight:      SpO2:  95% 97% 95% 95%    Wt Readings from Last 3 Encounters:  09/24/13 64.7 kg (142 lb 10.2 oz)  08/26/13 64.7 kg (142 lb 10.2 oz)  07/04/11 61.6 kg (135 lb 12.9 oz)     Intake/Output Summary (Last 24 hours) at 09/26/13 0843 Last data filed at 09/26/13 0236  Gross per 24 hour  Intake    360 ml  Output      0 ml  Net    360 ml    Exam  General: Well developed, well nourished, NAD, appears stated age  HEENT: NCAT, PERRLA, EOMI, Anicteic Sclera, mucous membranes moist.  Neck: Supple, no JVD, no masses  Cardiovascular: S1 S2 auscultated. Irregular.  Respiratory: Clear to auscultation bilaterally with equal chest rise  Abdomen: Soft, nontender, nondistended, + bowel sounds  Extremities: warm dry without cyanosis clubbing. Left leg wrapped   Neuro: AAOx3, cranial nerves grossly intact.   Skin: Without rashes exudates or nodules  Psych: Normal affect and demeanor with intact judgement and insight   Data Review   Micro Results No results found for this or any previous visit (from the past 240 hour(s)).  Radiology Reports No results found.  CBC  Recent Labs Lab 09/23/13 2138 09/24/13 0538 09/25/13 0413 09/26/13 0556  WBC 15.0* 14.5* 13.2* 12.2*  HGB 9.5* 9.2* 8.2* 7.7*  HCT 29.1* 26.8* 24.3* 23.0*  PLT 241 218 206 201  MCV 84.6 84.3 83.8 83.3  MCH 27.6 28.9 28.3 27.9  MCHC 32.6 34.3 33.7 33.5  RDW 14.4 14.4 14.3 14.1  LYMPHSABS 2.0  --   --   --   MONOABS 1.8*  --   --   --   EOSABS 0.4  --   --   --   BASOSABS 0.0  --   --   --     Chemistries   Recent Labs Lab 09/23/13 2138 09/24/13 0538 09/25/13 0413 09/26/13 0556  NA 133* 133* 131* 129*  K 5.1 5.0 4.9 4.4  CL 95* 96 97 94*  CO2 25 23 24 22   GLUCOSE 145* 116* 108* 104*  BUN 29* 29* 24* 21  CREATININE 1.28* 1.27* 1.11* 1.01  CALCIUM 9.2 8.8 8.3* 8.1*  AST 10  --   --   --   ALT 8  --   --   --   ALKPHOS 82  --   --   --   BILITOT 0.3  --   --   --     ------------------------------------------------------------------------------------------------------------------  estimated creatinine clearance is 31.3 ml/min (by C-G formula based on Cr of 1.01). ------------------------------------------------------------------------------------------------------------------ No results found for this basename: HGBA1C,  in the last 72 hours ------------------------------------------------------------------------------------------------------------------ No results found for this basename: CHOL, HDL, LDLCALC, TRIG, CHOLHDL, LDLDIRECT,  in the last 72 hours ------------------------------------------------------------------------------------------------------------------ No results found for this basename: TSH, T4TOTAL, FREET3, T3FREE, THYROIDAB,  in the last 72 hours ------------------------------------------------------------------------------------------------------------------  Recent Labs  09/26/13 0745  RETICCTPCT 1.2    Coagulation profile  Recent Labs Lab 09/23/13 2323 09/26/13 0556  INR 1.01 1.21    No results found for this basename: DDIMER,  in the last 72 hours  Cardiac Enzymes No results found for this basename: CK, CKMB, TROPONINI, MYOGLOBIN,  in the last 168 hours ------------------------------------------------------------------------------------------------------------------ No components found with this basename: POCBNP,     Nikola Blackston D.O. on 09/26/2013 at 8:43 AM  Between 7am to 7pm - Pager - [phone]  After 7pm go to [email or web address] - password [email or web address]  And look for the night coverage person covering for me after hours  [facility] Office  [phone]

## 2013-09-26 NOTE — Progress Notes (Signed)
Date: 09/26/13 Location: [facility] Inpatient  HD # 4 DVT LLE  Events noted, started on Coumadin. Continues on IV Doxy for cellulitis. Has been receiving moist dressings to LLE  PE:  LLE with continued significant edema , redness largely surrounding wound, cellulitis vs chronic wound  TTP over thigh medially Wound with minimal slough and remaining open wound granulated  A/P  Plan A Cell matrix application today. New wound orders written for every other day application surgilube and I will reassess in a week for new wound orders.  Procedure note: Diagnosis: open wound of left leg Procedure: Application of A Cell matrix 75 cm2 to left leg  Slough removed from wound with scissors and rinsed with saline. A Cell Burn Matrix meshed, SN [id number] applied to wound bed after preparation and secured to adjacent skin with steris. Mepilex applied as cover dressing and surgical lubricant applied over Mepilex. Dry Kerlix applied. Patient tolerated well.   Irene Limbo, MD [facility]astic [facility] Reconstructive Surgery [phone]

## 2013-09-26 NOTE — Consult Note (Signed)
Agree 

## 2013-09-26 NOTE — Progress Notes (Signed)
Utilization review completed.  

## 2013-09-27 ENCOUNTER — Inpatient Hospital Stay (HOSPITAL_COMMUNITY): Payer: Medicare Other

## 2013-09-27 DIAGNOSIS — Y92009 Unspecified place in unspecified non-institutional (private) residence as the place of occurrence of the external cause: Secondary | ICD-10-CM

## 2013-09-27 LAB — IRON AND TIBC
IRON: 11 ug/dL — AB (ref 42–135)
Saturation Ratios: 5 % — ABNORMAL LOW (ref 20–55)
TIBC: 225 ug/dL — ABNORMAL LOW (ref 250–470)
UIBC: 214 ug/dL (ref 125–400)

## 2013-09-27 LAB — PROTIME-INR
INR: 1 (ref 0.00–1.49)
PROTHROMBIN TIME: 13 s (ref 11.6–15.2)

## 2013-09-27 LAB — CBC
HCT: 23.8 % — ABNORMAL LOW (ref 36.0–46.0)
Hemoglobin: 8 g/dL — ABNORMAL LOW (ref 12.0–15.0)
MCH: 27.9 pg (ref 26.0–34.0)
MCHC: 33.6 g/dL (ref 30.0–36.0)
MCV: 82.9 fL (ref 78.0–100.0)
PLATELETS: 215 10*3/uL (ref 150–400)
RBC: 2.87 MIL/uL — ABNORMAL LOW (ref 3.87–5.11)
RDW: 14 % (ref 11.5–15.5)
WBC: 11.6 10*3/uL — ABNORMAL HIGH (ref 4.0–10.5)

## 2013-09-27 MED ORDER — FENTANYL CITRATE 0.05 MG/ML IJ SOLN
INTRAMUSCULAR | Status: AC
  Filled 2013-09-27: qty 2

## 2013-09-27 MED ORDER — ONDANSETRON HCL 4 MG/2ML IJ SOLN
INTRAMUSCULAR | Status: AC
  Filled 2013-09-27: qty 2

## 2013-09-27 MED ORDER — FENTANYL CITRATE 0.05 MG/ML IJ SOLN
INTRAMUSCULAR | Status: AC | PRN
  Administered 2013-09-27: 25 ug via INTRAVENOUS

## 2013-09-27 MED ORDER — ONDANSETRON HCL 4 MG/2ML IJ SOLN
INTRAMUSCULAR | Status: AC | PRN
  Administered 2013-09-27: 4 mg via INTRAVENOUS

## 2013-09-27 MED ORDER — MIDAZOLAM HCL 2 MG/2ML IJ SOLN
INTRAMUSCULAR | Status: AC
  Filled 2013-09-27: qty 4

## 2013-09-27 MED ORDER — MIDAZOLAM HCL 2 MG/2ML IJ SOLN
INTRAMUSCULAR | Status: AC | PRN
  Administered 2013-09-27: 0.5 mg via INTRAVENOUS

## 2013-09-27 MED ORDER — DOCUSATE SODIUM 100 MG PO CAPS
100.0000 mg | ORAL_CAPSULE | Freq: Two times a day (BID) | ORAL | Status: DC
  Administered 2013-09-27 – 2013-09-30 (×7): 100 mg via ORAL
  Filled 2013-09-27 (×8): qty 1

## 2013-09-27 NOTE — Progress Notes (Signed)
Pt going for IVC procedure on 2/3. Pt was ordered Lovenox 65 mg. Paged provider on call about continuing to administer lovenox despite pending procedure. Provider Schorr stated to continue with administration. Will continue to monitor.

## 2013-09-27 NOTE — Procedures (Signed)
IVCgram (CO2) IVC filter placement No complication No blood loss. See complete dictation in [facility].

## 2013-09-27 NOTE — Progress Notes (Signed)
Physical Therapy Treatment Patient Details Name: Shirley Strickland MRN: [id number] DOB: 1916-07-25 Today's Date: 09/27/2013 Time: 1040-1055 PT Time Calculation (min): 15 min  PT Assessment / Plan / Recommendation  History of Present Illness Admitted with LLE Cellulitis (wound from fall Dec 30th), DVTs LLE   PT Comments   Good progress with mobility; Pain LLE is limiting amb distance today; Continue to recommend SNF for post-acute rehab  Follow Up Recommendations  SNF;Supervision/Assistance - 24 hour     Does the patient have the potential to tolerate intense rehabilitation     Barriers to Discharge        Equipment Recommendations  Rolling walker with 5" wheels;3in1 (PT)    Recommendations for Other Services    Frequency Min 3X/week   Progress towards PT Goals Progress towards PT goals: Progressing toward goals  Plan Current plan remains appropriate    Precautions / Restrictions Precautions Precautions: Fall   Pertinent Vitals/Pain RLE 5/10 elevated for edema and pain control     Mobility  Bed Mobility Overal bed mobility: Needs Assistance Bed Mobility: Supine to Sit Supine to sit: Min assist General bed mobility comments: Cues for technique Transfers Overall transfer level: Needs assistance Equipment used: Rolling walker (2 wheeled) Transfers: Sit to/from Stand Sit to Stand: Min assist General transfer comment: cues for technique, safety, and hand placement Ambulation/Gait Ambulation/Gait assistance: Min guard Ambulation Distance (Feet): 6 Feet Assistive device: Rolling walker (2 wheeled) Gait Pattern/deviations: Step-to pattern Gait velocity: slow General Gait Details: Cues for technigue, sequence, posture    Exercises     PT Diagnosis:    PT Problem List:   PT Treatment Interventions:     PT Goals (current goals can now be found in the care plan section) Acute Rehab PT Goals Patient Stated Goal: Eventually get home PT Goal Formulation: With patient Time  For Goal Achievement: 10/09/13 Potential to Achieve Goals: Good  Visit Information  Last PT Received On: 09/27/13 Assistance Needed: +1 History of Present Illness: Admitted with LLE Cellulitis (wound from fall Dec 30th), DVTs LLE    Subjective Data  Subjective: Happy to get OOB Patient Stated Goal: Eventually get home   Cognition  Cognition Arousal/Alertness: Awake/alert Behavior During Therapy: WFL for tasks assessed/performed Overall Cognitive Status: Within Functional Limits for tasks assessed    Balance     End of Session PT - End of Session Activity Tolerance: Patient tolerated treatment well Patient left: in chair;with call bell/phone within reach Nurse Communication: Mobility status   GP     [facility] [place], [place]  09/27/2013, 4:16 PM

## 2013-09-27 NOTE — Progress Notes (Addendum)
[facility]                                                                              Patient Demographics  Shirley Strickland, is a 80 y.o. female, DOB - 1916/04/23, ZJI:[id number]  Admit date - 09/23/2013   Admitting Physician Velvet Bathe, MD  Outpatient Primary MD for the patient is [name], MD  LOS - 4   Chief Complaint  Patient presents with  . Leg Pain  . Leg Swelling      Interim history 80 year old female presented with left worse with swelling erythema and discomfort found to have cellulitis and left lower extremity DVT. Place on Lovenox and was going to be bridged to Coumadin however hemoglobin continues to drop. Spoke with interventional radiology regarding IVC filter placement that took place on 09/27/2013. Discontinue Lovenox at this time. Patient will likely need nursing home placement.  Assessment & Plan   Left lower extremity swelling secondary to Cellulitis vs LLE DVT  -LE duplex: acute deep vein thrombosis involving the left common femoral vein, left femoral vein, left popliteal vein, and left posterial tibial vein. -Cellulitis: multiple drug allergies, will continue IV doxycycline, improving    -Will discontinue Lovenox and Coumadin, INR will be placing IVC filter today.  Anemia -unknown source, possibly related to anticoagulation -Will stop lovenox and coumadin -FOBT pending, -Anemia panel reviewed, good iron stores -CT abd/pelvis did not show retroperitoneal bleed  A-fib  -Currently rate controlled, continue metoprolol -Not on Coumadin at home most likely suspecting secondary to recent history of falls, at this juncture continue Lovenox for possible VTE (pharmacy to dose)  -This with the house and patient the risks versus benefits of anticoagulation with patient being a fall risk. -For anticoagulation at this time.  HTN (hypertension)  -Continue metoprolol and add medications if necessary   Chronic diastolic heart failure    -Compensated -Lasix held due to Creatinine  AKI -Cr 1.01, improving -Likely secondary to lasix -Will continue to monitor  Leukocytosis -Likely reactive vs secondary to cellulitis, patient is afebrile -Will continue to monitor  Code Status: Full  Family Communication: Niece at bedside   Disposition Plan: Admitted. Will likely need SNF.  Time Spent in minutes   25 minutes  Procedures  09/27/2013 IVC filter placed by IR  Lower Extremity Duplex Summary: - Findings consistent with acute deep vein thrombosis involving the left common femoral vein, left femoral vein, left popliteal vein, and left posterial tibial vein. - No evidence of Baker's cyst on the left.  Consults   Interventional Radiology  DVT Prophylaxis  Lovenox   Lab Results  Component Value Date   PLT 215 09/27/2013    Medications  Scheduled Meds: . docusate sodium  100 mg Oral BID  . doxycycline (VIBRAMYCIN) IV  100 mg Intravenous Q12H  . enoxaparin (LOVENOX) injection  65 mg Subcutaneous Q24H  . fentaNYL      . metoprolol tartrate  12.5 mg Oral BID  . midazolam      . ondansetron      . sodium chloride  3 mL Intravenous Q12H   Continuous Infusions:   PRN Meds:.acetaminophen, acetaminophen, loteprednol, ondansetron (ZOFRAN) IV, ondansetron  Antibiotics  Anti-infectives   Start     Dose/Rate Route Frequency Ordered Stop   09/24/13 1200  doxycycline (VIBRAMYCIN) 100 mg in dextrose 5 % 250 mL IVPB     100 mg 125 mL/hr over 120 Minutes Intravenous Every 12 hours 09/24/13 0120     09/24/13 0145  doxycycline (VIBRAMYCIN) 100 mg in dextrose 5 % 250 mL IVPB     100 mg 125 mL/hr over 120 Minutes Intravenous  Once 09/24/13 0138 09/24/13 0430   09/23/13 2330  vancomycin (VANCOCIN) IVPB 1000 mg/200 mL premix  Status:  Discontinued     1,000 mg 200 mL/hr over 60 Minutes Intravenous  Once 09/23/13 2320 09/24/13 0120   09/23/13 2330  cefTRIAXone (ROCEPHIN) 1 g in dextrose 5 % 50 mL IVPB  Status:   Discontinued     1 g 100 mL/hr over 30 Minutes Intravenous Every 24 hours 09/23/13 2320 09/24/13 0120        Subjective:   [name] seen and examined today.  Patient has no complaints today.  Objective:   Filed Vitals:   09/27/13 0841 09/27/13 0850 09/27/13 0855 09/27/13 0900  BP: 170/57 158/44 159/51 123/49  Pulse:  69 67 68  Temp:      TempSrc:      Resp: 22 24 22 16   Height:      Weight:      SpO2: 98% 100% 97% 97%    Wt Readings from Last 3 Encounters:  09/24/13 64.7 kg (142 lb 10.2 oz)  08/26/13 64.7 kg (142 lb 10.2 oz)  07/04/11 61.6 kg (135 lb 12.9 oz)     Intake/Output Summary (Last 24 hours) at 09/27/13 1055 Last data filed at 09/27/13 0100  Gross per 24 hour  Intake    980 ml  Output    500 ml  Net    480 ml    Exam  General: Well developed, well nourished, NAD, appears stated age  HEENT: NCAT, PERRLA, EOMI, Anicteic Sclera, mucous membranes moist.  Neck: Supple, no JVD, no masses  Cardiovascular: S1 S2 auscultated. Irregular.  Respiratory: Clear to auscultation bilaterally with equal chest rise  Abdomen: Soft, nontender, nondistended, + bowel sounds  Extremities: warm dry without cyanosis clubbing. Left leg wrapped   Neuro: AAOx3, cranial nerves grossly intact.   Skin: Without rashes exudates or nodules  Psych: Normal affect and demeanor with intact judgement and insight   Data Review   Micro Results Recent Results (from the past 240 hour(s))  SURGICAL PCR SCREEN     Status: None   Collection Time    09/26/13  3:41 PM      Result Value Range Status   MRSA, PCR NEGATIVE  NEGATIVE Final   Staphylococcus aureus NEGATIVE  NEGATIVE Final   Comment:            The Xpert SA Assay (FDA     approved for NASAL specimens     in patients over 7 years of age),     is one component of     a comprehensive surveillance     program.  Test performance has     been validated by [employer] for patients greater     than or equal to 19  year old.     It is not intended     to diagnose infection nor to     guide or monitor treatment.    Radiology Reports No results found.  CBC  Recent Labs  Lab 09/23/13 2138 09/24/13 0538 09/25/13 0413 09/26/13 0556 09/27/13 0635  WBC 15.0* 14.5* 13.2* 12.2* 11.6*  HGB 9.5* 9.2* 8.2* 7.7* 8.0*  HCT 29.1* 26.8* 24.3* 23.0* 23.8*  PLT 241 218 206 201 215  MCV 84.6 84.3 83.8 83.3 82.9  MCH 27.6 28.9 28.3 27.9 27.9  MCHC 32.6 34.3 33.7 33.5 33.6  RDW 14.4 14.4 14.3 14.1 14.0  LYMPHSABS 2.0  --   --   --   --   MONOABS 1.8*  --   --   --   --   EOSABS 0.4  --   --   --   --   BASOSABS 0.0  --   --   --   --     Chemistries   Recent Labs Lab 09/23/13 2138 09/24/13 0538 09/25/13 0413 09/26/13 0556  NA 133* 133* 131* 129*  K 5.1 5.0 4.9 4.4  CL 95* 96 97 94*  CO2 25 23 24 22   GLUCOSE 145* 116* 108* 104*  BUN 29* 29* 24* 21  CREATININE 1.28* 1.27* 1.11* 1.01  CALCIUM 9.2 8.8 8.3* 8.1*  AST 10  --   --   --   ALT 8  --   --   --   ALKPHOS 82  --   --   --   BILITOT 0.3  --   --   --    ------------------------------------------------------------------------------------------------------------------ estimated creatinine clearance is 31.3 ml/min (by C-G formula based on Cr of 1.01). ------------------------------------------------------------------------------------------------------------------ No results found for this basename: HGBA1C,  in the last 72 hours ------------------------------------------------------------------------------------------------------------------ No results found for this basename: CHOL, HDL, LDLCALC, TRIG, CHOLHDL, LDLDIRECT,  in the last 72 hours ------------------------------------------------------------------------------------------------------------------ No results found for this basename: TSH, T4TOTAL, FREET3, T3FREE, THYROIDAB,  in the last 72  hours ------------------------------------------------------------------------------------------------------------------  Recent Labs  09/26/13 0745  VITAMINB12 288  FOLATE 15.8  FERRITIN 206  RETICCTPCT 1.2    Coagulation profile  Recent Labs Lab 09/23/13 2323 09/26/13 0556 09/27/13 0635  INR 1.01 1.21 1.00    No results found for this basename: DDIMER,  in the last 72 hours  Cardiac Enzymes No results found for this basename: CK, CKMB, TROPONINI, MYOGLOBIN,  in the last 168 hours ------------------------------------------------------------------------------------------------------------------ No components found with this basename: POCBNP,     Norine Reddington D.O. on 09/27/2013 at 10:55 AM  Between 7am to 7pm - Pager - [phone]  After 7pm go to [email or web address] - password [email or web address]  And look for the night coverage person covering for me after hours  [facility] Office  [phone]

## 2013-09-28 ENCOUNTER — Inpatient Hospital Stay (HOSPITAL_COMMUNITY): Payer: Medicare Other

## 2013-09-28 LAB — PROTIME-INR
INR: 1.17 (ref 0.00–1.49)
Prothrombin Time: 14.7 seconds (ref 11.6–15.2)

## 2013-09-28 LAB — ABO/RH: ABO/RH(D): A NEG

## 2013-09-28 LAB — OCCULT BLOOD X 1 CARD TO LAB, STOOL: Fecal Occult Bld: NEGATIVE

## 2013-09-28 LAB — CBC
HEMATOCRIT: 21.7 % — AB (ref 36.0–46.0)
HEMOGLOBIN: 7.2 g/dL — AB (ref 12.0–15.0)
MCH: 27.6 pg (ref 26.0–34.0)
MCHC: 33.2 g/dL (ref 30.0–36.0)
MCV: 83.1 fL (ref 78.0–100.0)
Platelets: 219 10*3/uL (ref 150–400)
RBC: 2.61 MIL/uL — ABNORMAL LOW (ref 3.87–5.11)
RDW: 14 % (ref 11.5–15.5)
WBC: 9.6 10*3/uL (ref 4.0–10.5)

## 2013-09-28 LAB — PREPARE RBC (CROSSMATCH)

## 2013-09-28 MED ORDER — DOXYCYCLINE HYCLATE 100 MG PO TABS
100.0000 mg | ORAL_TABLET | Freq: Two times a day (BID) | ORAL | Status: DC
  Administered 2013-09-28 – 2013-09-30 (×5): 100 mg via ORAL
  Filled 2013-09-28 (×6): qty 1

## 2013-09-28 MED ORDER — FUROSEMIDE 20 MG PO TABS
20.0000 mg | ORAL_TABLET | Freq: Every day | ORAL | Status: DC
  Administered 2013-09-28 – 2013-09-30 (×3): 20 mg via ORAL
  Filled 2013-09-28 (×3): qty 1

## 2013-09-28 NOTE — Progress Notes (Addendum)
[facility]                                                                              Patient Demographics  Shirley Strickland, is a 80 y.o. female, DOB - [birth year], HX:[id number]  Admit date - 09/23/2013   Admitting Physician Velvet Bathe, MD  Outpatient Primary MD for the patient is Shirley Patricia, MD  LOS - 5   Chief Complaint  Patient presents with  . Leg Pain  . Leg Swelling      Interim history 80 year old female presented with left worse with swelling erythema and discomfort found to have cellulitis and left lower extremity DVT. Place on Lovenox and was going to be bridged to Coumadin however hemoglobin continues to drop. Spoke with interventional radiology regarding IVC filter placement that took place on 09/27/2013. Discontinued Lovenox . Patient will need nursing home placement.  Assessment & Plan   Left lower extremity swelling secondary to Cellulitis and LLE DVT  -LE duplex: acute deep vein thrombosis involving the left common femoral vein, left femoral vein, left popliteal vein, and left posterial tibial vein. -Cellulitis: multiple drug allergies, will continue doxycycline, improving, wound care per Dr. Iran Planas appreciated -Yesterday anticoagulation i.e Lovenox and Coumadin were discontinued due to worsening anemia and IVC filter placed  Anemia -could be dilutional, acute illness, some blood loss from wound L leg -her anticoagulation was stopped yesterday , hemoccult negative  -Anemia panel c/w iron deficiency -CT abd/pelvis did not show retroperitoneal bleed -transfuse 1 unit PRBC today, do not anticipate need for aggressive workup given advanced ago  A-fib  -Currently rate controlled, continue metoprolol -Not on Coumadin at home most likely suspecting secondary to recent history of falls, at this juncture continue Lovenox for possible VTE (pharmacy to dose)  -This with the house and patient the risks versus benefits of anticoagulation with patient  being a fall risk. -For anticoagulation at this time.  R ankle swelling with limited RoM -check Xray r/o fracture  HTN (hypertension)  -Continue metoprolol    Chronic diastolic heart failure  -Compensated -Lasix on hold, resume at low dose or PRN at discharge -will restart lasix today after blood  AKI -Cr 1.01, improved -lasix held initially  Leukocytosis -resolved  Code Status: Full  Family Communication: no family at bedside   Disposition Plan: Admitted. Will need SNF in 24-48hours if stable Time Spent in minutes   25 minutes  Procedures  09/27/2013 IVC filter placed by IR  Lower Extremity Duplex Summary: - Findings consistent with acute deep vein thrombosis involving the left common femoral vein, left femoral vein, left popliteal vein, and left posterial tibial vein. - No evidence of Baker's cyst on the left.  Consults   Interventional Radiology  DVT Prophylaxis  SCDs  Lab Results  Component Value Date   PLT 219 09/28/2013    Medications  Scheduled Meds: . docusate sodium  100 mg Oral BID  . doxycycline  100 mg Oral Q12H  . metoprolol tartrate  12.5 mg Oral BID  . sodium chloride  3 mL Intravenous Q12H   Continuous Infusions:   PRN Meds:.acetaminophen, acetaminophen, loteprednol, ondansetron (ZOFRAN) IV, ondansetron  Antibiotics    Anti-infectives  Start     Dose/Rate Route Frequency Ordered Stop   09/28/13 1045  doxycycline (VIBRA-TABS) tablet 100 mg     100 mg Oral Every 12 hours 09/28/13 1033     09/24/13 1200  doxycycline (VIBRAMYCIN) 100 mg in dextrose 5 % 250 mL IVPB  Status:  Discontinued     100 mg 125 mL/hr over 120 Minutes Intravenous Every 12 hours 09/24/13 0120 09/28/13 1033   09/24/13 0145  doxycycline (VIBRAMYCIN) 100 mg in dextrose 5 % 250 mL IVPB     100 mg 125 mL/hr over 120 Minutes Intravenous  Once 09/24/13 0138 09/24/13 0430   09/23/13 2330  vancomycin (VANCOCIN) IVPB 1000 mg/200 mL premix  Status:  Discontinued     1,000  mg 200 mL/hr over 60 Minutes Intravenous  Once 09/23/13 2320 09/24/13 0120   09/23/13 2330  cefTRIAXone (ROCEPHIN) 1 g in dextrose 5 % 50 mL IVPB  Status:  Discontinued     1 g 100 mL/hr over 30 Minutes Intravenous Every 24 hours 09/23/13 2320 09/24/13 0120        Subjective:   Dorothy Puffer feels weak, some pain in R ankle  Objective:   Filed Vitals:   09/27/13 2123 09/27/13 2259 09/28/13 0440 09/28/13 0925  BP: 128/43 139/45 131/45 136/41  Pulse: 70 71 61 68  Temp: 98.4 F (36.9 C)  97.5 F (36.4 C)   TempSrc:      Resp: 18  18   Height:      Weight:      SpO2: 97%  98%     Wt Readings from Last 3 Encounters:  09/24/13 64.7 kg (142 lb 10.2 oz)  08/26/13 64.7 kg (142 lb 10.2 oz)  07/04/11 61.6 kg (135 lb 12.9 oz)     Intake/Output Summary (Last 24 hours) at 09/28/13 1036 Last data filed at 09/28/13 0919  Gross per 24 hour  Intake   1100 ml  Output    400 ml  Net    700 ml    Exam  General: Well developed, well nourished, NAD, appears stated age  HEENT: NCAT, PERRLA, EOMI, Anicteic Sclera, mucous membranes moist.  Neck: Supple, no JVD, no masses  Cardiovascular: S1 S2 auscultated. Irregular.  Respiratory: Clear to auscultation bilaterally with equal chest rise  Abdomen: Soft, nontender, nondistended, + bowel sounds  Extremities: bruising of skin R ankle with swelling, decreased RoM. Left leg wrapped   Neuro: AAOx3, cranial nerves grossly intact.   Skin: Without rashes exudates or nodules  Psych: Normal affect and demeanor with intact judgement and insight   Data Review   Micro Results Recent Results (from the past 240 hour(s))  SURGICAL PCR SCREEN     Status: None   Collection Time    09/26/13  3:41 PM      Result Value Range Status   MRSA, PCR NEGATIVE  NEGATIVE Final   Staphylococcus aureus NEGATIVE  NEGATIVE Final   Comment:            The Xpert SA Assay (FDA     approved for NASAL specimens     in patients over 85 years of age),      is one component of     a comprehensive surveillance     program.  Test performance has     been validated by [employer] for patients greater     than or equal to 66 year old.     It is not  intended     to diagnose infection nor to     guide or monitor treatment.    Radiology Reports No results found.  CBC  Recent Labs Lab 09/23/13 2138 09/24/13 0538 09/25/13 0413 09/26/13 0556 09/27/13 0635 09/28/13 0550  WBC 15.0* 14.5* 13.2* 12.2* 11.6* 9.6  HGB 9.5* 9.2* 8.2* 7.7* 8.0* 7.2*  HCT 29.1* 26.8* 24.3* 23.0* 23.8* 21.7*  PLT 241 218 206 201 215 219  MCV 84.6 84.3 83.8 83.3 82.9 83.1  MCH 27.6 28.9 28.3 27.9 27.9 27.6  MCHC 32.6 34.3 33.7 33.5 33.6 33.2  RDW 14.4 14.4 14.3 14.1 14.0 14.0  LYMPHSABS 2.0  --   --   --   --   --   MONOABS 1.8*  --   --   --   --   --   EOSABS 0.4  --   --   --   --   --   BASOSABS 0.0  --   --   --   --   --     Chemistries   Recent Labs Lab 09/23/13 2138 09/24/13 0538 09/25/13 0413 09/26/13 0556  NA 133* 133* 131* 129*  K 5.1 5.0 4.9 4.4  CL 95* 96 97 94*  CO2 25 23 24 22   GLUCOSE 145* 116* 108* 104*  BUN 29* 29* 24* 21  CREATININE 1.28* 1.27* 1.11* 1.01  CALCIUM 9.2 8.8 8.3* 8.1*  AST 10  --   --   --   ALT 8  --   --   --   ALKPHOS 82  --   --   --   BILITOT 0.3  --   --   --    ------------------------------------------------------------------------------------------------------------------ estimated creatinine clearance is 31.3 ml/min (by C-G formula based on Cr of 1.01). ------------------------------------------------------------------------------------------------------------------ No results found for this basename: HGBA1C,  in the last 72 hours ------------------------------------------------------------------------------------------------------------------ No results found for this basename: CHOL, HDL, LDLCALC, TRIG, CHOLHDL, LDLDIRECT,  in the last 72  hours ------------------------------------------------------------------------------------------------------------------ No results found for this basename: TSH, T4TOTAL, FREET3, T3FREE, THYROIDAB,  in the last 72 hours ------------------------------------------------------------------------------------------------------------------  Recent Labs  09/26/13 0745  VITAMINB12 288  FOLATE 15.8  FERRITIN 206  TIBC 225*  IRON 11*  RETICCTPCT 1.2    Coagulation profile  Recent Labs Lab 09/23/13 2323 09/26/13 0556 09/27/13 0635 09/28/13 0550  INR 1.01 1.21 1.00 1.17    No results found for this basename: DDIMER,  in the last 72 hours  Cardiac Enzymes No results found for this basename: CK, CKMB, TROPONINI, MYOGLOBIN,  in the last 168 hours ------------------------------------------------------------------------------------------------------------------ No components found with this basename: POCBNP,     Domenic Polite MD. on 09/28/2013 at 10:36 AM  Between 7am to 7pm - Pager - [phone]  After 7pm go to [email or web address] - password [email or web address]  And look for the night coverage person covering for me after hours  [facility] Office  [phone]

## 2013-09-29 DIAGNOSIS — D649 Anemia, unspecified: Secondary | ICD-10-CM | POA: Diagnosis present

## 2013-09-29 DIAGNOSIS — I824Y9 Acute embolism and thrombosis of unspecified deep veins of unspecified proximal lower extremity: Principal | ICD-10-CM

## 2013-09-29 DIAGNOSIS — I82412 Acute embolism and thrombosis of left femoral vein: Secondary | ICD-10-CM | POA: Diagnosis present

## 2013-09-29 LAB — BASIC METABOLIC PANEL
BUN: 27 mg/dL — ABNORMAL HIGH (ref 6–23)
CO2: 23 mEq/L (ref 19–32)
Calcium: 9.2 mg/dL (ref 8.4–10.5)
Chloride: 97 mEq/L (ref 96–112)
Creatinine, Ser: 0.92 mg/dL (ref 0.50–1.10)
GFR calc Af Amer: 59 mL/min — ABNORMAL LOW (ref >90)
GFR calc non Af Amer: 51 mL/min — ABNORMAL LOW (ref >90)
Glucose, Bld: 95 mg/dL (ref 70–99)
Potassium: 4.8 mEq/L (ref 3.7–5.3)
Sodium: 133 mEq/L — ABNORMAL LOW (ref 137–147)

## 2013-09-29 LAB — CBC
HCT: 28.4 % — ABNORMAL LOW (ref 36.0–46.0)
Hemoglobin: 9.6 g/dL — ABNORMAL LOW (ref 12.0–15.0)
MCH: 27.9 pg (ref 26.0–34.0)
MCHC: 33.8 g/dL (ref 30.0–36.0)
MCV: 82.6 fL (ref 78.0–100.0)
Platelets: 257 10*3/uL (ref 150–400)
RBC: 3.44 MIL/uL — ABNORMAL LOW (ref 3.87–5.11)
RDW: 14.3 % (ref 11.5–15.5)
WBC: 9.4 10*3/uL (ref 4.0–10.5)

## 2013-09-29 LAB — TYPE AND SCREEN
ABO/RH(D): A NEG
Antibody Screen: NEGATIVE
UNIT DIVISION: 0

## 2013-09-29 LAB — PRO B NATRIURETIC PEPTIDE: Pro B Natriuretic peptide (BNP): 1121 pg/mL — ABNORMAL HIGH (ref 0–450)

## 2013-09-29 LAB — PROTIME-INR
INR: 1.02 (ref 0.00–1.49)
Prothrombin Time: 13.2 seconds (ref 11.6–15.2)

## 2013-09-29 MED ORDER — CHLORPROMAZINE HCL 10 MG PO TABS: 10.0000 mg | ORAL_TABLET | Freq: Four times a day (QID) | ORAL | Status: DC | PRN

## 2013-09-29 MED ORDER — FUROSEMIDE 40 MG PO TABS
40.0000 mg | ORAL_TABLET | ORAL | Status: AC
  Administered 2013-09-29: 40 mg via ORAL
  Filled 2013-09-29: qty 1

## 2013-09-29 MED ORDER — FUROSEMIDE 20 MG PO TABS
20.0000 mg | ORAL_TABLET | Freq: Once | ORAL | Status: AC
  Administered 2013-09-29: 20 mg via ORAL
  Filled 2013-09-29: qty 1

## 2013-09-29 NOTE — Clinical Documentation Improvement (Signed)
Possible Clinical Conditions?    Hyponatremia                   Other Condition___________________                 Cannot Clinically Determine_________   Supporting Information: Sodium range  09/24/13 - 09/29/13 = 129 -133.  IVF (NS) started on 09/24/13    Thank You, Serena Colonel ,RN Clinical Documentation Specialist:  [place] [employer] [facility]

## 2013-09-29 NOTE — Progress Notes (Signed)
Physical Therapy Treatment Patient Details Name: Shirley Strickland MRN: [id number] DOB: 05/05/1916 Today's Date: 09/29/2013 Time: 5397-6734 PT Time Calculation (min): 14 min  PT Assessment / Plan / Recommendation  History of Present Illness Admitted with LLE Cellulitis (wound from fall Dec 30th), DVTs LLE   PT Comments   Pt very pleasant and reports that walking feel like it is getting easier, although still limited.  Pt unable to perform exercises due to generalized soreness.  Follow Up Recommendations  SNF;Supervision/Assistance - 24 hour     Does the patient have the potential to tolerate intense rehabilitation     Barriers to Discharge        Equipment Recommendations  Rolling walker with 5" wheels;3in1 (PT)    Recommendations for Other Services    Frequency Min 3X/week   Progress towards PT Goals Progress towards PT goals: Progressing toward goals  Plan Current plan remains appropriate    Precautions / Restrictions Precautions Precautions: Fall   Pertinent Vitals/Pain L LE soreness    Mobility  Bed Mobility Supine to sit: Min assist General bed mobility comments: A at end of transfer to get L LE off of bed. Transfers Overall transfer level: Needs assistance Equipment used: Rolling walker (2 wheeled) Transfers: Sit to/from Stand Sit to Stand: Min assist General transfer comment: cues for technique Ambulation/Gait Ambulation/Gait assistance: Min guard Ambulation Distance (Feet): 5 Feet Assistive device: Rolling walker (2 wheeled) Gait Pattern/deviations: Step-to pattern Gait velocity: slow General Gait Details:  (Cues for technique and proper gait pattern)    Exercises     PT Diagnosis:    PT Problem List:   PT Treatment Interventions:     PT Goals (current goals can now be found in the care plan section) Acute Rehab PT Goals Potential to Achieve Goals: Good  Visit Information  Last PT Received On: 09/29/13 Assistance Needed: +1 History of Present  Illness: Admitted with LLE Cellulitis (wound from fall Dec 30th), DVTs LLE    Subjective Data  Subjective: "I'll try my best."   Cognition  Cognition Arousal/Alertness: Awake/alert Behavior During Therapy: WFL for tasks assessed/performed Overall Cognitive Status: Within Functional Limits for tasks assessed    Balance     End of Session PT - End of Session Activity Tolerance: Patient tolerated treatment well Patient left: in chair;with call bell/phone within reach Nurse Communication: Mobility status   GP     Shirley Strickland 09/29/2013, 10:21 AM

## 2013-09-29 NOTE — Discharge Summary (Signed)
Physician Discharge Summary  Shirley Strickland JJK:[id number] DOB: 05/18/1916 DOA: 09/23/2013  PCP: Junious Silk, MD  Admit date: 09/23/2013 Discharge date: 09/30/2013  Time spent: 30 minutes  Recommendations for Outpatient Follow-up:  1. Patient being discharged to skilled nursing facility 2.   Discharge Diagnoses:  Active Problems:   A-fib   HTN (hypertension)   Chronic diastolic heart failure   Cellulitis Acute lower extremity DVT Anemia  Discharge Condition: Improved, being discharged to skilled nursing facility  Diet recommendation: Heart healthy  Filed Weights   09/24/13 0100  Weight: 64.7 kg (142 lb 10.2 oz)    History of present illness:  80 year old female with past medical history of atrial fibrillation presented on 1/31 with increasing erythema and swelling of the left lower extremity and found to have cellulitis plus left lower extremity DVT. Admitted to the hospitalist service  Hospital Course:  Active Problems:   A-fib: Rate controlled with beta blocker. Stable medical issue. Not for anticoagulation    HTN (hypertension): Continue home dose of Lasix, beta blocker and myocarditis.    Chronic diastolic heart failure: Lasix initially held. BNP checked on 2/5, mildly elevated at 1100. Mild increase in part due to atrial fibrillation. Patient restarted on home dose of Lasix, however also was given supplemental doses of Lasix prior to discharge    Cellulitis: Patient initially started on IV Rocephin and then changed over to doxycycline. By 2/6, day of discharge, patient had completed a seven-day course  Anemia: Patient initially started on anticoagulation for DVT which led to hemoglobin drop steadily over several days from 9.5 down to 7.7. Patient given 1 unit packed red blood cells after hemoglobin went down to 7.2 on 2/4 which led to increased back up to 9.6. Hemoccult negative. Has some anemia of chronic disease and iron deficiency anemia  DVT: Initially started  on Lovenox with plans for Coumadin bridge. This was discontinued when patient started having a steady decline in hemoglobin over several days. She's not felt to have any acute bleeding. Nevertheless, interventional radiology placed IVC filter on 2/3. Stable.  Procedures:  Lower extremity Doppler: Acute DVT in left common femoral vein, left femoral vein left petechial vein and left posterior tibial vein  IVC filter placed by interventional radiology on 2/3  Consultations:  None  Discharge Exam: Filed Vitals:   09/29/13 1430  BP: 171/67  Pulse: 68  Temp: 98.5 F (36.9 C)  Resp: 18    General: Alert and oriented x3, no acute distress Cardiovascular: Irregular rhythm, rate controlled Respiratory: Clear to auscultation bilaterally Abdomen: Soft, nontender, nondistended, positive bowel sounds Extremities: Left leg wrapped, no edema  Discharge Instructions     Medication List         calcium-vitamin D 500-200 MG-UNIT per tablet  Commonly known as:  OSCAL WITH D  Take 2 tablets by mouth daily.     cholecalciferol 1000 UNITS tablet  Commonly known as:  VITAMIN D  Take 1,000 Units by mouth daily.     furosemide 20 MG tablet  Commonly known as:  LASIX  Take 0.5 tablets (10 mg total) by mouth every other day as needed for fluid or edema.     LOTEMAX 0.5 % ophthalmic suspension  Generic drug:  loteprednol  Place 1 drop into both eyes daily as needed (itching).     metoprolol tartrate 12.5 mg Tabs tablet  Commonly known as:  LOPRESSOR  Take 0.5 tablets (12.5 mg total) by mouth 2 (two) times daily.  SYSTANE OP  Place 1 drop into both eyes daily.     telmisartan 80 MG tablet  Commonly known as:  MICARDIS  Take 80 mg by mouth daily.       Allergies  Allergen Reactions  . Clarithromycin Nausea Only  . [employer] Allergy] Other (See Comments)    Stomach pains and passed out recently - doesn't remember reaction before this weekend  . Codeine Nausea And Vomiting  .  Darvocet [Propoxyphene N-Acetaminophen] Nausea And Vomiting  . Demadex [Torsemide] Hives and Nausea And Vomiting    UNKNOWN  . Diovan Hct [Valsartan-Hydrochlorothiazide] Other (See Comments)    unknown  . Eggs Or Egg-Derived Products Nausea And Vomiting  . Ex-Lax [Senna] Other (See Comments)    unknown  . Ibuprofen Nausea Only  . Indanediones Other (See Comments)    unknown  . Indapamide Other (See Comments)    unknown  . Morphine And Related Nausea And Vomiting  . Sulfa Antibiotics Other (See Comments)    Couldn't breathe  . Benzonatate Itching and Rash    Eyes swelled  . Penicillins Rash  . Septra [Bactrim] Rash      The results of significant diagnostics from this hospitalization (including imaging, microbiology, ancillary and laboratory) are listed below for reference.    Significant Diagnostic Studies: Ct Abdomen Pelvis Wo Contrast  09/26/2013   CLINICAL DATA:  Evaluate for retroperitoneal hemorrhage.  EXAM: CT ABDOMEN AND PELVIS WITHOUT CONTRAST  TECHNIQUE: Multidetector CT imaging of the abdomen and pelvis was performed following the standard protocol without intravenous contrast.  COMPARISON:  None.  FINDINGS: Areas of minimal dependent atelectasis are appreciated within the lung bases. There are also areas of minimal bibasilar pleural thickening. The lung bases otherwise unremarkable. Within the dome of the medial segment left lobe of the liver a 2 cm low attenuating focus is identified with Hounsfield units of 2.9. The liver is otherwise unremarkable.  Bilateral cortical thinning is identified within the kidneys without evidence of hydronephrosis, hydroureter, nephrolithiasis, nor ureteral lithiasis.  Evaluation of pancreas demonstrates a 1.7 x 1.5 cm low attenuating nodule along the medial aspect of the head of the pancreas. This area demonstrates counsel units of 7.21. Image 24 series 5. Pancreas is otherwise unremarkable.  Evaluation of the spleen that demonstrates a calcified  nodular focus within the apex of the splenic hilum. 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 Spleen is otherwise unremarkable.  There is no evidence of an abdominal aortic aneurysm. Atherosclerotic calcifications are appreciated within the aorta.  Within limitations of a noncontrast CT there is no evidence of abdominal or pelvic free fluid, loculated fluid collections nor evidence of an intraperitoneal nor retroperitoneal hematoma. Within the limitations of a noncontrast CT there is no evidence of bowel obstruction, enteritis, colitis, diverticulitis, North appendicitis. The urinary bladder is distended with urine. The pelvis is otherwise unremarkable. There is no evidence of pneumoperitoneum.  There is no evidence of aggressive appearing osseous lesions. Multilevel degenerative disc disease changes appreciated within the lumbar spine. Areas of superior endplate central depression are appreciated involving L3 and L4. The bones are osteopenic.  There is no evidence of an abdominal wall nor inguinal hernia.  IMPRESSION: 1. No CT evidence of an intraperitoneal nor retroperitoneal hematoma. 2. Findings likely reflecting a small pseudocyst adjacent to the medial aspect of the pancreatic head. More ominous etiologies cannot be excluded. Further evaluation with pancreatic protocol contrasted CT is recommended. 3. Likely cysts within the left lobe of the liver 4.  Atherosclerotic calcifications involving the aorta 5. Cortical thinning within the kidneys without further abnormalities 6. Multilevel spondylolysis within the lumbar spine.   Electronically Signed   By: Margaree Mackintosh M.D.   On: 09/26/2013 14:05   Dg Ankle 2 Views Right  09/28/2013   CLINICAL DATA:  Right ankle pain  EXAM: RIGHT ANKLE - 2 VIEW  COMPARISON:  DG ANKLE COMPLETE*R* dated 08/23/2013  FINDINGS: Right ankle demonstrates no fracture or dislocation. The ankle mortise is intact. There is severe osteopenia. There is a tiny plantar  calcaneal spur. The soft tissues are normal.  IMPRESSION: No acute osseous injury of the right ankle.   Electronically Signed   By: Kathreen Devoid   On: 09/28/2013 11:15   Ir Ivc Filter Plmt / S&i /img Guid/mod Sed  09/27/2013   CLINICAL DATA:  Lower extremity DVT. Decreasing hemoglobin and Fall risk, relative contraindications for anticoagulation. Contrast allergy.  EXAM: INFERIOR VENACAVOGRAM  IVC FILTER PLACEMENT UNDER FLUOROSCOPY  TECHNIQUE: The procedure, risks (including but not limited to bleeding, infection, organ damage ), benefits, and alternatives were explained to the patient. Questions regarding the procedure were encouraged and answered. The patient understands and consents to the procedure. Patency of the right IJ vein was confirmed with ultrasound with image documentation. An appropriate skin site was determined. Skin site was marked, prepped with chlorhexidine, and draped using maximum barrier technique. The region was infiltrated locally with 1% lidocaine.  Intravenous Fentanyl and Versed were administered as conscious sedation during continuous cardiorespiratory monitoring by the radiology RN, with a total moderate sedation time of 10 minutes.  Under real-time ultrasound guidance, the right IJ vein was accessed with a 21 gauge micropuncture needle; the needle tip within the vein was confirmed with ultrasound image documentation. The needle was exchanged over a 018 guidewire for a transitional dilator, which allow advancement of the [facility] wire into the IVC. A long 6 French vascular sheath was placed for CO2 inferior venacavography. This demonstrated no caval thrombus. Renal vein inflows were evident.  The [facility] IVC filter was advanced through the sheath and successfully deployed under fluoroscopy at the L2 level. Followup CO2 cavagram demonstrates stable filter position and no evident complication. The sheath was removed and hemostasis achieved at the site. No immediate complication.  FLUOROSCOPY  TIME:  42 seconds  IMPRESSION: 1. Normal IVC. No thrombus or significant anatomic variation. 2. Technically successful infrarenal IVC filter placement. This is a retrievable model.   Electronically Signed   By: Arne Cleveland M.D.   On: 09/27/2013 09:34    Microbiology: Recent Results (from the past 240 hour(s))  SURGICAL PCR SCREEN     Status: None   Collection Time    09/26/13  3:41 PM      Result Value Range Status   MRSA, PCR NEGATIVE  NEGATIVE Final   Staphylococcus aureus NEGATIVE  NEGATIVE Final   Comment:            The Xpert SA Assay (FDA     approved for NASAL specimens     in patients over 11 years of age),     is one component of     a comprehensive surveillance     program.  Test performance has     been validated by [employer] for patients greater     than or equal to 57 year old.     It is not intended     to diagnose infection nor to  guide or monitor treatment.     Labs: Basic Metabolic Panel:  Recent Labs Lab 09/23/13 2138 09/24/13 0538 09/25/13 0413 09/26/13 0556 09/29/13 0530  NA 133* 133* 131* 129* 133*  K 5.1 5.0 4.9 4.4 4.8  CL 95* 96 97 94* 97  CO2 25 23 24 22 23   GLUCOSE 145* 116* 108* 104* 95  BUN 29* 29* 24* 21 27*  CREATININE 1.28* 1.27* 1.11* 1.01 0.92  CALCIUM 9.2 8.8 8.3* 8.1* 9.2   Liver Function Tests:  Recent Labs Lab 09/23/13 2138  AST 10  ALT 8  ALKPHOS 82  BILITOT 0.3  PROT 6.1  ALBUMIN 2.5*   No results found for this basename: LIPASE, AMYLASE,  in the last 168 hours No results found for this basename: AMMONIA,  in the last 168 hours CBC:  Recent Labs Lab 09/23/13 2138  09/25/13 0413 09/26/13 0556 09/27/13 0635 09/28/13 0550 09/29/13 0530  WBC 15.0*  < > 13.2* 12.2* 11.6* 9.6 9.4  NEUTROABS 10.8*  --   --   --   --   --   --   HGB 9.5*  < > 8.2* 7.7* 8.0* 7.2* 9.6*  HCT 29.1*  < > 24.3* 23.0* 23.8* 21.7* 28.4*  MCV 84.6  < > 83.8 83.3 82.9 83.1 82.6  PLT 241  < > 206 201 215 219 257  < > = values  in this interval not displayed. Cardiac Enzymes: No results found for this basename: CKTOTAL, CKMB, CKMBINDEX, TROPONINI,  in the last 168 hours BNP: BNP (last 3 results)  Recent Labs  08/25/13 0427 09/29/13 0530  PROBNP 3100.0* 1121.0*   CBG: No results found for this basename: GLUCAP,  in the last 168 hours     Signed:  Annita Brod  [facility] 09/29/2013, 6:02 PM

## 2013-09-29 NOTE — Clinical Documentation Improvement (Signed)
09/29/13  Dear Dr. Daleen Bo Rolley Sims  Possible Clinical Conditions?    Expected Acute Blood Loss Anemia  Acute Blood Loss Anemia  Acute on chronic blood loss anemia  Other Condition________________  Cannot Clinically Determine    Supporting Information: Per  Progress Notes by Domenic Polite, MD at 09/28/2013 10:36 AM:  Anemia  -could be dilutional, acute illness, some blood loss from wound L leg  -her anticoagulation was stopped yesterday , hemoccult negative  -Anemia panel c/w iron deficiency  -CT abd/pelvis did not show retroperitoneal bleed  -transfuse 1 unit PRBC today, do not anticipate need for aggressive workup given advanced ago   HgG Range 09/23/13 - 09/28/13 =   7.2 - 9.5 Hct   Range 09/23/13 - 09/28/13 = 21.1 - 29.1    Thank You, Serena Colonel ,RN Clinical Documentation Specialist:  [place] [facility] [employer]

## 2013-09-30 LAB — BASIC METABOLIC PANEL
BUN: 30 mg/dL — AB (ref 6–23)
CHLORIDE: 94 meq/L — AB (ref 96–112)
CO2: 25 mEq/L (ref 19–32)
Calcium: 9.4 mg/dL (ref 8.4–10.5)
Creatinine, Ser: 1.07 mg/dL (ref 0.50–1.10)
GFR calc non Af Amer: 43 mL/min — ABNORMAL LOW (ref >90)
GFR, EST AFRICAN AMERICAN: 50 mL/min — AB (ref >90)
Glucose, Bld: 110 mg/dL — ABNORMAL HIGH (ref 70–99)
Potassium: 4.4 mEq/L (ref 3.7–5.3)
Sodium: 132 mEq/L — ABNORMAL LOW (ref 137–147)

## 2013-09-30 LAB — PROTIME-INR
INR: 1.07 (ref 0.00–1.49)
PROTHROMBIN TIME: 13.7 s (ref 11.6–15.2)

## 2013-09-30 NOTE — Progress Notes (Signed)
Patient d/c to SNF, IV removed, report called to SNF.

## 2013-10-03 ENCOUNTER — Encounter: Payer: Self-pay | Admitting: Internal Medicine

## 2013-10-03 ENCOUNTER — Non-Acute Institutional Stay (SKILLED_NURSING_FACILITY): Payer: Medicare Other | Admitting: Internal Medicine

## 2013-10-03 DIAGNOSIS — I82412 Acute embolism and thrombosis of left femoral vein: Secondary | ICD-10-CM

## 2013-10-03 DIAGNOSIS — I5032 Chronic diastolic (congestive) heart failure: Secondary | ICD-10-CM

## 2013-10-03 DIAGNOSIS — L0291 Cutaneous abscess, unspecified: Secondary | ICD-10-CM

## 2013-10-03 DIAGNOSIS — L0390 Cellulitis, unspecified: Secondary | ICD-10-CM

## 2013-10-03 DIAGNOSIS — D649 Anemia, unspecified: Secondary | ICD-10-CM

## 2013-10-03 DIAGNOSIS — I824Y9 Acute embolism and thrombosis of unspecified deep veins of unspecified proximal lower extremity: Secondary | ICD-10-CM

## 2013-10-03 DIAGNOSIS — I10 Essential (primary) hypertension: Secondary | ICD-10-CM

## 2013-10-03 DIAGNOSIS — I4891 Unspecified atrial fibrillation: Secondary | ICD-10-CM

## 2013-10-03 NOTE — Assessment & Plan Note (Addendum)
Hb dropped from 9.5 to  7.7 to 7.2 on Lovenox; transfused 1 unit PRBC back up to 9.6; anemia of chronic and iron def but ferritin was nl;serum iron and saturation low c/w anemia of chronic dx;will recheck CBC 1 week

## 2013-10-03 NOTE — Assessment & Plan Note (Signed)
Controlled with Lopressor and micardis

## 2013-10-03 NOTE — Progress Notes (Signed)
MRN: [id number] Name: Shirley Strickland  Sex: female Age: 80 y.o. DOB: [birth year]  [place] [facility]: [facility] Facility/Room: 311 Level Of Care: SNF Provider: Inocencio Homes D Emergency Contacts: Extended Emergency Contact Information Primary Emergency Contact: Oda Cogan [place] of [place] Phone: [phone] Relation: Other Secondary Emergency Contact: Smith,Sara  [place] Phone: [phone] Relation: Niece  Code Status: FULL  Allergies: Clarithromycin; [place]; Codeine; Darvocet; Demadex; Diovan hct; Eggs or egg-derived products; Ex-lax; Ibuprofen; Indanediones; Indapamide; Morphine and related; Sulfa antibiotics; Benzonatate; Penicillins; and Septra  Chief Complaint  Patient presents with  . nursing home admission    HPI: Patient is 80 y.o. female who with recent dx Cellul;itis and DVT LLE. Cellulitis resolved, DVT tx with IVC filter sec unable to anti-coagulate.   Past Medical History  Diagnosis Date  . 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 Cancer     pt states only skin cancer  . Numbness of legs     started after fall yesterday 07/03/11  . Bruise     bruise on left foot from fall 07/03/11  . Knee pain, acute 07/04/11    Pt states left knee twisted under her in fall 07/03/11 and now painful  . Rash and nonspecific skin eruption 07/04/11    rash on bilateral upper legs  . Syncope   . Fall   . HTN (hypertension)   . A-fib     Past Surgical History  Procedure Laterality Date  . Elbow surgery      right  . Appendectomy    . Exploratory laparotomy  07/04/11    years ago to find cause of bleeding      Medication List       This list is accurate as of: 10/03/13  4:00 PM.  Always use your most recent med list.               calcium-vitamin D 500-200 MG-UNIT per tablet  Commonly known as:  OSCAL WITH D  Take 2 tablets by mouth daily.     cholecalciferol 1000 UNITS tablet  Commonly known as:  VITAMIN D  Take 1,000 Units by mouth  daily.     furosemide 20 MG tablet  Commonly known as:  LASIX  Take 0.5 tablets (10 mg total) by mouth every other day as needed for fluid or edema.     LOTEMAX 0.5 % ophthalmic suspension  Generic drug:  loteprednol  Place 1 drop into both eyes daily as needed (itching).     metoprolol tartrate 12.5 mg Tabs tablet  Commonly known as:  LOPRESSOR  Take 0.5 tablets (12.5 mg total) by mouth 2 (two) times daily.     SYSTANE OP  Place 1 drop into both eyes daily.     telmisartan 80 MG tablet  Commonly known as:  MICARDIS  Take 80 mg by mouth daily.        No orders of the defined types were placed in this encounter.    Immunization History  Administered Date(s) Administered  . Td 08/24/2013    History  Substance Use Topics  . Smoking status: Never Smoker   . Smokeless tobacco: Never Used  . Alcohol Use: 1.2 oz/week    2 Cans of beer per week     Comment: 2 cans beer per day    Family history is noncontributory    Review of Systems  DATA OBTAINED: from patient; no c/o GENERAL: Feels well no fevers, fatigue, appetite changes  SKIN: No itching, rash or wounds EYES: No eye pain, redness, discharge EARS: No earache, tinnitus, change in hearing NOSE: No congestion, drainage or bleeding  MOUTH/THROAT: No mouth or tooth pain, No sore throat, No difficulty chewing or swallowing  RESPIRATORY: No cough, wheezing, SOB CARDIAC: No chest pain, palpitations, + extremity edema  GI: No abdominal pain, No N/V/D or constipation, No heartburn or reflux  GU: No dysuria, frequency or urgency, or incontinence  MUSCULOSKELETAL: No unrelieved bone/joint pain NEUROLOGIC: No headache, dizziness or focal weakness PSYCHIATRIC: No overt anxiety or sadness. Sleeps well. No behavior issue.   Filed Vitals:   10/03/13 1534  BP: 130/74  Pulse: 62  Temp: 97 F (36.1 C)  Resp: 20    Physical Exam  GENERAL APPEARANCE: Alert, conversant. Appropriately groomed. No acute distress.  SKIN: No  diaphoresis rash, or wounds HEAD: Normocephalic, atraumatic  EYES: Conjunctiva/lids clear. Pupils round, reactive. EOMs intact.  EARS: External exam WNL, canals clear. Hearing grossly normal.  NOSE: No deformity or discharge.  MOUTH/THROAT: Lips w/o lesions. RESPIRATORY: Breathing is even, unlabored. Lung sounds are clear   CARDIOVASCULAR: Heart RRR no murmurs, rubs or gallops.+ peripheral edema.LLE, no redness or heat  GASTROINTESTINAL: Abdomen is soft, non-tender, not distended w/ normal bowel sounds. GENITOURINARY: Bladder non tender, not distended  MUSCULOSKELETAL: No abnormal joints or musculature NEUROLOGIC: Oriented X2. Cranial nerves 2-12 grossly intact. Moves all extremities no tremor. PSYCHIATRIC: Mood and affect appropriate to situation, no behavioral issues  Patient Active Problem List   Diagnosis Date Noted  . Acute deep vein thrombosis (DVT) of femoral vein of left lower extremity 09/29/2013  . Anemia 09/29/2013  . Cellulitis 09/23/2013  . Hyponatremia 08/31/2013  . UTI (urinary tract infection) 08/31/2013  . Chronic diastolic heart failure [id number]  . Left fibular fracture 08/24/2013  . Laceration of left leg 08/23/2013  . Syncope and collapse 08/23/2013  . HTN (hypertension) 08/23/2013  . Fall at home 08/23/2013  . Sacral insufficiency fracture 07/07/2011  . Fall   . A-fib     CBC    Component Value Date/Time   WBC 9.4 09/29/2013 0530   RBC 3.44* 09/29/2013 0530   RBC 3.00* 09/26/2013 0745   HGB 9.6* 09/29/2013 0530   HCT 28.4* 09/29/2013 0530   PLT 257 09/29/2013 0530   MCV 82.6 09/29/2013 0530   LYMPHSABS 2.0 09/23/2013 2138   MONOABS 1.8* 09/23/2013 2138   EOSABS 0.4 09/23/2013 2138   BASOSABS 0.0 09/23/2013 2138    CMP     Component Value Date/Time   NA 132* 09/30/2013 0355   K 4.4 09/30/2013 0355   CL 94* 09/30/2013 0355   CO2 25 09/30/2013 0355   GLUCOSE 110* 09/30/2013 0355   BUN 30* 09/30/2013 0355   CREATININE 1.07 09/30/2013 0355   CALCIUM 9.4 09/30/2013 0355    PROT 6.1 09/23/2013 2138   ALBUMIN 2.5* 09/23/2013 2138   AST 10 09/23/2013 2138   ALT 8 09/23/2013 2138   ALKPHOS 82 09/23/2013 2138   BILITOT 0.3 09/23/2013 2138   GFRNONAA 43* 09/30/2013 0355   GFRAA 50* 09/30/2013 0355    Assessment and Plan  Acute deep vein thrombosis (DVT) of femoral vein of left lower extremity Presented with swollen LLE and found to have cellulitis and DVT;started on Lovenox and Hb trended down so IVC filter placed;pt not coaged on a fib sec to fall risk  Cellulitis Of LLE- txed with rocephin and Doxy total 7 days with resolution  A-fib Rate controlled with Lopressor;  not on anti-coag sec to fall risk  Chronic diastolic heart failure On Lopressor and Lasix QOD prn  HTN (hypertension) Controlled with Lopressor and micardis  Anemia Hb dropped from 9.5 to  7.7 to 7.2 on Lovenox; transfused 1 unit PRBC back up to 9.6; anemia of chronic and iron def but ferritin was nl;serum iron and saturation low c/w anemia of chronic dx;will recheck CBC 1 week    Hennie Duos, MD

## 2013-10-03 NOTE — Assessment & Plan Note (Signed)
Presented with swollen LLE and found to have cellulitis and DVT;started on Lovenox and Hb trended down so IVC filter placed;pt not coaged on a fib sec to fall risk

## 2013-10-03 NOTE — Assessment & Plan Note (Signed)
Rate controlled with Lopressor; not on anti-coag sec to fall risk

## 2013-10-03 NOTE — Assessment & Plan Note (Signed)
Of LLE- txed with rocephin and Doxy total 7 days with resolution

## 2013-10-03 NOTE — Assessment & Plan Note (Signed)
On Lopressor and Lasix QOD prn

## 2013-11-04 ENCOUNTER — Non-Acute Institutional Stay (SKILLED_NURSING_FACILITY): Payer: Medicare Other | Admitting: Nurse Practitioner

## 2013-11-04 DIAGNOSIS — I4891 Unspecified atrial fibrillation: Secondary | ICD-10-CM

## 2013-11-04 DIAGNOSIS — I5032 Chronic diastolic (congestive) heart failure: Secondary | ICD-10-CM

## 2013-11-04 DIAGNOSIS — I824Y9 Acute embolism and thrombosis of unspecified deep veins of unspecified proximal lower extremity: Secondary | ICD-10-CM

## 2013-11-04 DIAGNOSIS — I10 Essential (primary) hypertension: Secondary | ICD-10-CM

## 2013-11-04 DIAGNOSIS — I82412 Acute embolism and thrombosis of left femoral vein: Secondary | ICD-10-CM

## 2013-11-04 DIAGNOSIS — D649 Anemia, unspecified: Secondary | ICD-10-CM

## 2013-11-04 NOTE — Progress Notes (Signed)
Patient ID: Shirley Strickland, female   DOB: 11/27/1915, 80 y.o.   MRN: [id number]    Nursing Home Location:  [place] of Service: SNF (31)  PCP: [name], MD  Allergies  Allergen Reactions  . Clarithromycin Nausea Only  . [employer] Allergy] Other (See Comments)    Stomach pains and passed out recently - doesn't remember reaction before this weekend  . Codeine Nausea And Vomiting  . Darvocet [Propoxyphene N-Acetaminophen] Nausea And Vomiting  . Demadex [Torsemide] Hives and Nausea And Vomiting    UNKNOWN  . Diovan Hct [Valsartan-Hydrochlorothiazide] Other (See Comments)    unknown  . Eggs Or Egg-Derived Products Nausea And Vomiting  . Ex-Lax [[place]enna] Other (See Comments)    unknown  . Ibuprofen Nausea Only  . Indanediones Other (See Comments)    unknown  . Indapamide Other (See Comments)    unknown  . Morphine And Related Nausea And Vomiting  . Sulfa Antibiotics Other (See Comments)    Couldn't breathe  . Benzonatate Itching and Rash    Eyes swelled  . Penicillins Rash  . Septra [Bactrim] Rash    Chief Complaint  Patient presents with  . Medical Managment of Chronic Issues    HPI:  80 year old female with pmh of afib, htn, who fell at home and fractured her fibula, and later sustained a wound to left calf which is requiring follow up by plastic surgery and ongoing wound care at [facility]; pt has been doing well and has no complaints today.   Review of Systems:  Review of Systems  Constitutional: Negative for fever and chills.  Respiratory: Negative for cough, shortness of breath and wheezing.   Cardiovascular: Negative for chest pain, palpitations and leg swelling.  Gastrointestinal: Negative for heartburn, nausea, vomiting and abdominal pain.  Genitourinary: Negative for dysuria, urgency and frequency.  Musculoskeletal: Negative for joint pain and myalgias.  Skin: Negative.   Neurological: Negative for dizziness and sensory change.      Past Medical History  Diagnosis Date  . 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 Cancer     pt states only skin cancer  . Numbness of legs     started after fall yesterday 07/03/11  . Bruise     bruise on left foot from fall 07/03/11  . Knee pain, acute 07/04/11    Pt states left knee twisted under her in fall 07/03/11 and now painful  . Rash and nonspecific skin eruption 07/04/11    rash on bilateral upper legs  . Syncope   . Fall   . HTN (hypertension)   . A-fib    Past Surgical History  Procedure Laterality Date  . Elbow surgery      right  . Appendectomy    . Exploratory laparotomy  07/04/11    years ago to find cause of bleeding   Social History:   reports that she has never smoked. She has never used smokeless tobacco. She reports that she drinks about 1.2 ounces of alcohol per week. She reports that she does not use illicit drugs.  No family history on file.  Medications: Patient's Medications  New Prescriptions   No medications on file  Previous Medications   CALCIUM-VITAMIN D (OSCAL WITH D) 500-200 MG-UNIT PER TABLET    Take 2 tablets by mouth daily.   CHOLECALCIFEROL (VITAMIN D) 1000 UNITS TABLET    Take 1,000 Units by mouth daily.    FUROSEMIDE (LASIX)  20 MG TABLET    Take 0.5 tablets (10 mg total) by mouth every other day as needed for fluid or edema.   LOTEPREDNOL (LOTEMAX) 0.5 % OPHTHALMIC SUSPENSION    Place 1 drop into both eyes daily as needed (itching).   METOPROLOL TARTRATE (LOPRESSOR) 12.5 MG TABS TABLET    Take 0.5 tablets (12.5 mg total) by mouth 2 (two) times daily.   POLYETHYL GLYCOL-PROPYL GLYCOL (SYSTANE OP)    Place 1 drop into both eyes daily.   TELMISARTAN (MICARDIS) 80 MG TABLET    Take 80 mg by mouth daily.    Modified Medications   No medications on file  Discontinued Medications   No medications on file     Physical Exam:  Filed Vitals:   11/04/13 1249  BP: 152/61  Pulse: 64  Temp: 97 F (36.1 C)  Resp: 20   Physical  Exam  Constitutional: She is well-developed, well-nourished, and in no distress.  HENT:  Head: Normocephalic and atraumatic.  Right Ear: External ear normal.  Left Ear: External ear normal.  Nose: Nose normal.  Mouth/Throat: Oropharynx is clear and moist. No oropharyngeal exudate.  Neck: Normal range of motion. Neck supple. No JVD present. No tracheal deviation present. No thyromegaly present.  Cardiovascular: Normal rate, regular rhythm and normal heart sounds.   Pulmonary/Chest: Effort normal and breath sounds normal. No stridor. No respiratory distress. She has no wheezes.  Abdominal: Soft. Bowel sounds are normal.  Musculoskeletal: Edema: +1 bilaterally.  Lymphadenopathy:    She has no cervical adenopathy.  Psychiatric: Affect normal.      Labs reviewed: Basic Metabolic Panel:  Recent Labs  09/26/13 0556 09/29/13 0530 09/30/13 0355  NA 129* 133* 132*  K 4.4 4.8 4.4  CL 94* 97 94*  CO2 22 23 25   GLUCOSE 104* 95 110*  BUN 21 27* 30*  CREATININE 1.01 0.92 1.07  CALCIUM 8.1* 9.2 9.4   Liver Function Tests:  Recent Labs  08/23/13 2008 09/23/13 2138  AST 15 10  ALT 10 8  ALKPHOS 46 82  BILITOT 0.6 0.3  PROT 6.2 6.1  ALBUMIN 3.0* 2.5*   No results found for this basename: LIPASE, AMYLASE,  in the last 8760 hours No results found for this basename: AMMONIA,  in the last 8760 hours CBC:  Recent Labs  08/23/13 2008  09/23/13 2138  09/27/13 0635 09/28/13 0550 09/29/13 0530  WBC 13.2*  < > 15.0*  < > 11.6* 9.6 9.4  NEUTROABS 12.1*  --  10.8*  --   --   --   --   HGB 10.1*  < > 9.5*  < > 8.0* 7.2* 9.6*  HCT 29.7*  < > 29.1*  < > 23.8* 21.7* 28.4*  MCV 85.8  < > 84.6  < > 82.9 83.1 82.6  PLT 176  < > 241  < > 215 219 257  < > = values in this interval not displayed. CBC NO Diff (Complete Blood Count)    Result: 10/02/2013 2:26 PM   ( Status: F )       WBC 8.5     4.0-10.5 K/uL SLN   RBC 3.44   L 4.22-5.81 MIL/uL SLN   Hemoglobin 9.3   L 13.0-17.0 g/dL SLN     Hematocrit 27.8   L 39.0-52.0 % SLN   MCV 80.8     78.0-100.0 fL SLN   MCH 27.0     26.0-34.0 pg SLN   MCHC 33.5  30.0-36.0 g/dL SLN   RDW 14.4     11.5-15.5 % SLN   Platelet Count 277     150-400 K/uL SLN   Basic Metabolic Panel    Result: 10/02/2013 3:00 PM   ( Status: F )       Sodium 132   L 135-145 mEq/L SLN   Potassium 4.7     3.5-5.3 mEq/L SLN   Chloride 98     96-112 mEq/L SLN   CO2 27     19-32 mEq/L SLN   Glucose 98     70-99 mg/dL SLN   BUN 34   H 6-23 mg/dL SLN   Creatinine 1.05     0.50-1.35 mg/dL SLN   Calcium 9.3     8.4-10.5 mg/dL SLN   Assessment/Plan 1. A-fib Rate controlled on metoprolol; not currently on anticoagulation due to fall risk   2. Chronic diastolic heart failure -stable on betablocker and lasix; will follow up bmp   3. HTN (hypertension) -stable on current medications   4. Anemia -will follow up cbc   5. Acute deep vein thrombosis (DVT) of femoral vein of left lower extremity -unable to anticoagulate due to fall risk and hgb dropping; has IVC filter  6. MEMORY LOSS bims score showing moderate memory loss; will start on aricept 5 mg for 4 weeks then to increase to 10 mg at this time  7. leg wound -conts with daily dressing changes and following with plastic surgery

## 2013-11-07 ENCOUNTER — Non-Acute Institutional Stay (SKILLED_NURSING_FACILITY): Payer: Medicare Other | Admitting: Internal Medicine

## 2013-11-07 ENCOUNTER — Encounter: Payer: Self-pay | Admitting: Internal Medicine

## 2013-11-07 DIAGNOSIS — L0291 Cutaneous abscess, unspecified: Secondary | ICD-10-CM

## 2013-11-07 DIAGNOSIS — L0390 Cellulitis, unspecified: Secondary | ICD-10-CM

## 2013-11-07 NOTE — Assessment & Plan Note (Signed)
Had a cellulitis before which was treated with rocephin and dixycycline for 7 days with resolution. Will treat with same again only doxy 100 BID for 14 days, rocephin 1 gm IM daily for 7 days, floraster 250 mg BID for 21 days and obtain wound culture today.

## 2013-11-07 NOTE — Progress Notes (Signed)
MRN: [id number] Name: Shirley Strickland  Sex: female Age: 80 y.o. DOB: 06/08/1916  [place] [facility] Facility/Room: 311 Level Of Care: SNF Provider: Inocencio Homes D Emergency Contacts: Extended Emergency Contact Information Primary Emergency Contact: Oda Cogan States of [place] Phone: [phone] Relation: Other Secondary Emergency Contact: Smith,Sara  [place] Phone: [phone] Relation: Niece  Code Status: FULL  Allergies: Clarithromycin; [place]; Codeine; Darvocet; Demadex; Diovan hct; Eggs or egg-derived products; Ex-lax; Ibuprofen; Indanediones; Indapamide; Morphine and related; Sulfa antibiotics; Benzonatate; Penicillins; and Septra  Chief Complaint  Patient presents with  . Acute Visit    HPI: Patient is 80 y.o. female who the nurses asked me to see because of swelling in her L leg.  Past Medical History  Diagnosis Date  . 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 Cancer     pt states only skin cancer  . Numbness of legs     started after fall yesterday 07/03/11  . Bruise     bruise on left foot from fall 07/03/11  . Knee pain, acute 07/04/11    Pt states left knee twisted under her in fall 07/03/11 and now painful  . Rash and nonspecific skin eruption 07/04/11    rash on bilateral upper legs  . Syncope   . Fall   . HTN (hypertension)   . A-fib     Past Surgical History  Procedure Laterality Date  . Elbow surgery      right  . Appendectomy    . Exploratory laparotomy  07/04/11    years ago to find cause of bleeding      Medication List       This list is accurate as of: 11/07/13  2:57 PM.  Always use your most recent med list.               calcium-vitamin D 500-200 MG-UNIT per tablet  Commonly known as:  OSCAL WITH D  Take 2 tablets by mouth daily.     cholecalciferol 1000 UNITS tablet  Commonly known as:  VITAMIN D  Take 1,000 Units by mouth daily.     furosemide 20 MG tablet  Commonly known as:  LASIX   Take 0.5 tablets (10 mg total) by mouth every other day as needed for fluid or edema.     LOTEMAX 0.5 % ophthalmic suspension  Generic drug:  loteprednol  Place 1 drop into both eyes daily as needed (itching).     metoprolol tartrate 12.5 mg Tabs tablet  Commonly known as:  LOPRESSOR  Take 0.5 tablets (12.5 mg total) by mouth 2 (two) times daily.     SYSTANE OP  Place 1 drop into both eyes daily.     telmisartan 80 MG tablet  Commonly known as:  MICARDIS  Take 80 mg by mouth daily.        No orders of the defined types were placed in this encounter.    Immunization History  Administered Date(s) Administered  . Td 08/24/2013    History  Substance Use Topics  . Smoking status: Never Smoker   . Smokeless tobacco: Never Used  . Alcohol Use: 1.2 oz/week    2 Cans of beer per week     Comment: 2 cans beer per day    Review of Systems  DATA OBTAINED: from patient GENERAL: Feels well no fevers, fatigue, appetite changes SKIN: pt says the swelling is much better than 3 days ago-she has been in the  bed and feels better HEENT: No complaint RESPIRATORY: No cough, wheezing, SOB CARDIAC: No chest pain, palpitations,+ L lower extremity edema  GI: No abdominal pain, No N/V/D or constipation, No heartburn or reflux  GU: No dysuria, frequency or urgency, or incontinence  MUSCULOSKELETAL: No unrelieved bone/joint pain NEUROLOGIC: No headache, dizziness or focal weakness PSYCHIATRIC: No overt anxiety or sadness. Sleeps well.   Filed Vitals:   11/07/13 1452  BP: 144/60  Pulse: 64  Temp: 96.7 F (35.9 C)  Resp: 20    Physical Exam  GENERAL APPEARANCE: Alert, conversant. Appropriately groomed. No acute distress  SKIN: LLE- old scars, small wound that opens with pulling on dressing with green pus evident, mod redness and heat leg and TTP in leg and inner L thigh HEENT: Unremarkable RESPIRATORY: Breathing is even, unlabored. Lung sounds are clear   CARDIOVASCULAR: Heart RRR  no murmurs, rubs or gallops. + LLE peripheral edema but swelling appears much better -skin is loose GASTROINTESTINAL: Abdomen is soft, non-tender, not distended w/ normal bowel sounds.  GENITOURINARY: Bladder non tender, not distended  MUSCULOSKELETAL: No abnormal joints or musculature NEUROLOGIC: Cranial nerves 2-12 grossly intact. Moves all extremities no tremor. PSYCHIATRIC: Mood and affect appropriate to situation, no behavioral issues  Patient Active Problem List   Diagnosis Date Noted  . Acute deep vein thrombosis (DVT) of femoral vein of left lower extremity 09/29/2013  . Anemia 09/29/2013  . Cellulitis 09/23/2013  . Hyponatremia 08/31/2013  . UTI (urinary tract infection) 08/31/2013  . Chronic diastolic heart failure 76/73/4193  . Left fibular fracture 08/24/2013  . Laceration of left leg 08/23/2013  . Syncope and collapse 08/23/2013  . HTN (hypertension) 08/23/2013  . Fall at home 08/23/2013  . Sacral insufficiency fracture 07/07/2011  . Fall   . A-fib     CBC    Component Value Date/Time   WBC 9.4 09/29/2013 0530   RBC 3.44* 09/29/2013 0530   RBC 3.00* 09/26/2013 0745   HGB 9.6* 09/29/2013 0530   HCT 28.4* 09/29/2013 0530   PLT 257 09/29/2013 0530   MCV 82.6 09/29/2013 0530   LYMPHSABS 2.0 09/23/2013 2138   MONOABS 1.8* 09/23/2013 2138   EOSABS 0.4 09/23/2013 2138   BASOSABS 0.0 09/23/2013 2138    CMP     Component Value Date/Time   NA 132* 09/30/2013 0355   K 4.4 09/30/2013 0355   CL 94* 09/30/2013 0355   CO2 25 09/30/2013 0355   GLUCOSE 110* 09/30/2013 0355   BUN 30* 09/30/2013 0355   CREATININE 1.07 09/30/2013 0355   CALCIUM 9.4 09/30/2013 0355   PROT 6.1 09/23/2013 2138   ALBUMIN 2.5* 09/23/2013 2138   AST 10 09/23/2013 2138   ALT 8 09/23/2013 2138   ALKPHOS 82 09/23/2013 2138   BILITOT 0.3 09/23/2013 2138   GFRNONAA 43* 09/30/2013 0355   GFRAA 50* 09/30/2013 0355    Assessment and Plan  Cellulitis Had a cellulitis before which was treated with rocephin and dixycycline for 7 days  with resolution. Will treat with same again only doxy 100 BID for 14 days, rocephin 1 gm IM daily for 7 days, floraster 250 mg BID for 21 days and obtain wound culture today.    Hennie Duos, MD

## 2013-11-11 ENCOUNTER — Non-Acute Institutional Stay (SKILLED_NURSING_FACILITY): Payer: Medicare Other | Admitting: Nurse Practitioner

## 2013-11-11 DIAGNOSIS — L0291 Cutaneous abscess, unspecified: Secondary | ICD-10-CM

## 2013-11-11 DIAGNOSIS — I10 Essential (primary) hypertension: Secondary | ICD-10-CM

## 2013-11-11 DIAGNOSIS — L0390 Cellulitis, unspecified: Secondary | ICD-10-CM

## 2013-11-11 NOTE — Progress Notes (Signed)
Patient ID: ANANIAH BOURDON, female   DOB: [birth year], 80 y.o.   MRN: [id number]    Nursing Home Location:  [place] of Service: SNF (31)  PCP: [name], MD  Allergies  Allergen Reactions  . Clarithromycin Nausea Only  . [employer] Allergy] Other (See Comments)    Stomach pains and passed out recently - doesn't remember reaction before this weekend  . Codeine Nausea And Vomiting  . Darvocet [Propoxyphene N-Acetaminophen] Nausea And Vomiting  . Demadex [Torsemide] Hives and Nausea And Vomiting    UNKNOWN  . Diovan Hct [Valsartan-Hydrochlorothiazide] Other (See Comments)    unknown  . Eggs Or Egg-Derived Products Nausea And Vomiting  . Ex-Lax [Senna] Other (See Comments)    unknown  . Ibuprofen Nausea Only  . Indanediones Other (See Comments)    unknown  . Indapamide Other (See Comments)    unknown  . Morphine And Related Nausea And Vomiting  . Sulfa Antibiotics Other (See Comments)    Couldn't breathe  . Benzonatate Itching and Rash    Eyes swelled  . Penicillins Rash  . Septra [Bactrim] Rash    Chief Complaint  Patient presents with  . Acute Visit    HPI:  80 year old female being seen today at the request of nursing due to potential reaction to antibiotic; pt was placed on doxycyline and rocephin due to increase swelling and redness in left leg with drainage. draingage was cultured which resulted in no growth. Pt has been refusing rocephin due to severe shortness of breath and "unable to move air" per pt reports. She states she can not breath after it is given to her and causes severe nausea; has not had but 2 doses but reported similar reactions each time and has anxiety abt taking medication at all. conts on doxycycline without difficulty and overall leg has improved. She states redness has calmed down and swelling and pain is much better.    Review of Systems:  Review of Systems  Constitutional: Negative for fever and chills.  Eyes:  Negative for blurred vision.  Respiratory: Negative for cough, shortness of breath and wheezing.   Cardiovascular: Negative for chest pain, palpitations and leg swelling.  Gastrointestinal: Negative for heartburn, nausea, vomiting, abdominal pain, diarrhea and constipation.  Genitourinary: Negative for dysuria.  Musculoskeletal: Positive for myalgias (in bilateral legs).  Skin:       Wound to left anterior shin   Neurological: Negative for dizziness, sensory change and weakness.     Past Medical History  Diagnosis Date  . 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 Cancer     pt states only skin cancer  . Numbness of legs     started after fall yesterday 07/03/11  . Bruise     bruise on left foot from fall 07/03/11  . Knee pain, acute 07/04/11    Pt states left knee twisted under her in fall 07/03/11 and now painful  . Rash and nonspecific skin eruption 07/04/11    rash on bilateral upper legs  . Syncope   . Fall   . HTN (hypertension)   . A-fib    Past Surgical History  Procedure Laterality Date  . Elbow surgery      right  . Appendectomy    . Exploratory laparotomy  07/04/11    years ago to find cause of bleeding   Social History:   reports that she has never smoked. She has never used  smokeless tobacco. She reports that she drinks about 1.2 ounces of alcohol per week. She reports that she does not use illicit drugs.  No family history on file.  Medications: Patient's Medications  New Prescriptions   No medications on file  Previous Medications   CALCIUM-VITAMIN D (OSCAL WITH D) 500-200 MG-UNIT PER TABLET    Take 2 tablets by mouth daily.   CHOLECALCIFEROL (VITAMIN D) 1000 UNITS TABLET    Take 1,000 Units by mouth daily.    FUROSEMIDE (LASIX) 20 MG TABLET    Take 0.5 tablets (10 mg total) by mouth every other day as needed for fluid or edema.   LOTEPREDNOL (LOTEMAX) 0.5 % OPHTHALMIC SUSPENSION    Place 1 drop into both eyes daily as needed (itching).   METOPROLOL  TARTRATE (LOPRESSOR) 12.5 MG TABS TABLET    Take 0.5 tablets (12.5 mg total) by mouth 2 (two) times daily.   POLYETHYL GLYCOL-PROPYL GLYCOL (SYSTANE OP)    Place 1 drop into both eyes daily.   TELMISARTAN (MICARDIS) 80 MG TABLET    Take 80 mg by mouth daily.    Modified Medications   No medications on file  Discontinued Medications   No medications on file     Physical Exam:  Filed Vitals:   11/11/13 1616  BP: 161/69  Pulse: 85  Temp: 97.9 F (36.6 C)  Resp: 20    Physical Exam  Constitutional: She is oriented to person, place, and time and well-developed, well-nourished, and in no distress.  HENT:  Nose: Nose normal.  Mouth/Throat: Oropharynx is clear and moist. No oropharyngeal exudate.  Cardiovascular: Normal rate, regular rhythm and normal heart sounds.   Pulmonary/Chest: Effort normal and breath sounds normal.  Abdominal: Soft. Bowel sounds are normal. She exhibits no distension.  Musculoskeletal: She exhibits tenderness. She exhibits no edema.  To lower extremities,  Neurological: She is alert and oriented to person, place, and time.  Skin: Skin is warm. There is erythema (in left leg).  Psychiatric: Affect normal.     Labs reviewed: Basic Metabolic Panel:  Recent Labs  09/26/13 0556 09/29/13 0530 09/30/13 0355  NA 129* 133* 132*  K 4.4 4.8 4.4  CL 94* 97 94*  CO2 22 23 25   GLUCOSE 104* 95 110*  BUN 21 27* 30*  CREATININE 1.01 0.92 1.07  CALCIUM 8.1* 9.2 9.4   Liver Function Tests:  Recent Labs  08/23/13 2008 09/23/13 2138  AST 15 10  ALT 10 8  ALKPHOS 46 82  BILITOT 0.6 0.3  PROT 6.2 6.1  ALBUMIN 3.0* 2.5*   No results found for this basename: LIPASE, AMYLASE,  in the last 8760 hours No results found for this basename: AMMONIA,  in the last 8760 hours CBC:  Recent Labs  08/23/13 2008  09/23/13 2138  09/27/13 0635 09/28/13 0550 09/29/13 0530  WBC 13.2*  < > 15.0*  < > 11.6* 9.6 9.4  NEUTROABS 12.1*  --  10.8*  --   --   --   --     HGB 10.1*  < > 9.5*  < > 8.0* 7.2* 9.6*  HCT 29.7*  < > 29.1*  < > 23.8* 21.7* 28.4*  MCV 85.8  < > 84.6  < > 82.9 83.1 82.6  PLT 176  < > 241  < > 215 219 257  < > = values in this interval not displayed.  Assessment/Plan 1. Cellulitis -improved; to cont doxycyline for complete course  - DC ROCEPHIN and place  on allergies  list   2. HTN (hypertension) -will cont to monitor BP nursing staff to notify if sbp over 180

## 2013-11-21 ENCOUNTER — Non-Acute Institutional Stay (SKILLED_NURSING_FACILITY): Payer: Medicare Other | Admitting: Internal Medicine

## 2013-11-21 ENCOUNTER — Encounter: Payer: Self-pay | Admitting: Internal Medicine

## 2013-11-21 DIAGNOSIS — L0390 Cellulitis, unspecified: Secondary | ICD-10-CM

## 2013-11-21 DIAGNOSIS — L0291 Cutaneous abscess, unspecified: Secondary | ICD-10-CM

## 2013-11-21 DIAGNOSIS — I824Y9 Acute embolism and thrombosis of unspecified deep veins of unspecified proximal lower extremity: Secondary | ICD-10-CM

## 2013-11-21 DIAGNOSIS — I82412 Acute embolism and thrombosis of left femoral vein: Secondary | ICD-10-CM

## 2013-11-21 NOTE — Assessment & Plan Note (Signed)
Pt's leg swelled-it is better today, much- and pt is concerned about it being her DVT. I assured her it was her DVT which I feel will swell on and off. We know its in the femoral vein. As long as the swelling resolves, like it did today, she was OK. I told her she should be off her leg when it is swelling. There will always be a risk. Pt did not c/o pain.

## 2013-11-21 NOTE — Assessment & Plan Note (Signed)
Wound in leg is healing. There is an area of drainage centrally but no redness or heat so no cellulitis.

## 2013-11-21 NOTE — Progress Notes (Signed)
MRN: [id number] Name: Shirley Strickland  Sex: female Age: 80 y.o. DOB: 1916/07/01  [place] #: [facility] Facility/Room: 311 Level Of Care: SNF Provider: Inocencio Homes D Emergency Contacts: Extended Emergency Contact Information Primary Emergency Contact: Oda Cogan [place] Phone: [phone] Relation: Other Secondary Emergency Contact: Smith,Sara  [place] Phone: [phone] Relation: Niece   Allergies: Rocephin; Clarithromycin; [place]; Codeine; Darvocet; Demadex; Diovan hct; Eggs or egg-derived products; Ex-lax; Ibuprofen; Indanediones; Indapamide; Morphine and related; Sulfa antibiotics; Benzonatate; Penicillins; and Septra  Chief Complaint  Patient presents with  . Acute Visit    HPI: Patient is 80 y.o. female who is concerned about the swelling in her leg.  Past Medical History  Diagnosis Date  . 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 Cancer     pt states only skin cancer  . Numbness of legs     started after fall yesterday 07/03/11  . Bruise     bruise on left foot from fall 07/03/11  . Knee pain, acute 07/04/11    Pt states left knee twisted under her in fall 07/03/11 and now painful  . Rash and nonspecific skin eruption 07/04/11    rash on bilateral upper legs  . Syncope   . Fall   . HTN (hypertension)   . A-fib     Past Surgical History  Procedure Laterality Date  . Elbow surgery      right  . Appendectomy    . Exploratory laparotomy  07/04/11    years ago to find cause of bleeding      Medication List       This list is accurate as of: 11/21/13  7:38 PM.  Always use your most recent med list.               calcium-vitamin D 500-200 MG-UNIT per tablet  Commonly known as:  OSCAL WITH D  Take 2 tablets by mouth daily.     cholecalciferol 1000 UNITS tablet  Commonly known as:  VITAMIN D  Take 1,000 Units by mouth daily.     furosemide 20 MG tablet  Commonly known as:  LASIX  Take 0.5 tablets (10 mg  total) by mouth every other day as needed for fluid or edema.     LOTEMAX 0.5 % ophthalmic suspension  Generic drug:  loteprednol  Place 1 drop into both eyes daily as needed (itching).     metoprolol tartrate 12.5 mg Tabs tablet  Commonly known as:  LOPRESSOR  Take 0.5 tablets (12.5 mg total) by mouth 2 (two) times daily.     SYSTANE OP  Place 1 drop into both eyes daily.     telmisartan 80 MG tablet  Commonly known as:  MICARDIS  Take 80 mg by mouth daily.        No orders of the defined types were placed in this encounter.    Immunization History  Administered Date(s) Administered  . Td 08/24/2013    History  Substance Use Topics  . Smoking status: Never Smoker   . Smokeless tobacco: Never Used  . Alcohol Use: 1.2 oz/week    2 Cans of beer per week     Comment: 2 cans beer per day    Review of Systems  DATA OBTAINED: from patient GENERAL: Feels well no fevers, fatigue, appetite changes SKIN: No itching, rash HEENT: No complaint RESPIRATORY: No cough, wheezing, SOB CARDIAC: No chest pain, palpitations,+ lower extremity edema for a day  GI: No abdominal pain, No N/V/D or constipation, No heartburn or reflux  GU: No dysuria, frequency or urgency, or incontinence  MUSCULOSKELETAL: No unrelieved bone/joint pain NEUROLOGIC: No headache, dizziness or focal weakness PSYCHIATRIC: No overt anxiety or sadness. Sleeps well.   Filed Vitals:   11/21/13 1916  BP: 177/83  Pulse: 66  Temp: 96.8 F (36 C)  Resp: 20    Physical Exam  GENERAL APPEARANCE: Alert, conversant. Appropriately groomed. No acute distress  SKIN: No diaphoresis rash;wound on L keg is healing in. There is a small central area with drainage but no redness or heat HEENT: Unremarkable RESPIRATORY: Breathing is even, unlabored. Lung sounds are clear   CARDIOVASCULAR: Heart RRR no murmurs, rubs or gallops. L leg and thigh is soft and not hot; it is larger than R leg  GASTROINTESTINAL: Abdomen is  soft, non-tender, not distended w/ normal bowel sounds.  GENITOURINARY: Bladder non tender, not distended  MUSCULOSKELETAL: No abnormal joints or musculature NEUROLOGIC: Cranial nerves 2-12 grossly intact. Moves all extremities no tremor. PSYCHIATRIC: Mood and affect appropriate to situation, no behavioral issues  Patient Active Problem List   Diagnosis Date Noted  . Acute deep vein thrombosis (DVT) of femoral vein of left lower extremity 09/29/2013  . Anemia 09/29/2013  . Cellulitis 09/23/2013  . Hyponatremia 08/31/2013  . UTI (urinary tract infection) 08/31/2013  . Chronic diastolic heart failure 41/66/0630  . Left fibular fracture 08/24/2013  . Laceration of left leg 08/23/2013  . Syncope and collapse 08/23/2013  . HTN (hypertension) 08/23/2013  . Fall at home 08/23/2013  . Sacral insufficiency fracture 07/07/2011  . Fall   . A-fib     CBC    Component Value Date/Time   WBC 9.4 09/29/2013 0530   RBC 3.44* 09/29/2013 0530   RBC 3.00* 09/26/2013 0745   HGB 9.6* 09/29/2013 0530   HCT 28.4* 09/29/2013 0530   PLT 257 09/29/2013 0530   MCV 82.6 09/29/2013 0530   LYMPHSABS 2.0 09/23/2013 2138   MONOABS 1.8* 09/23/2013 2138   EOSABS 0.4 09/23/2013 2138   BASOSABS 0.0 09/23/2013 2138    CMP     Component Value Date/Time   NA 132* 09/30/2013 0355   K 4.4 09/30/2013 0355   CL 94* 09/30/2013 0355   CO2 25 09/30/2013 0355   GLUCOSE 110* 09/30/2013 0355   BUN 30* 09/30/2013 0355   CREATININE 1.07 09/30/2013 0355   CALCIUM 9.4 09/30/2013 0355   PROT 6.1 09/23/2013 2138   ALBUMIN 2.5* 09/23/2013 2138   AST 10 09/23/2013 2138   ALT 8 09/23/2013 2138   ALKPHOS 82 09/23/2013 2138   BILITOT 0.3 09/23/2013 2138   GFRNONAA 43* 09/30/2013 0355   GFRAA 50* 09/30/2013 0355    Assessment and Plan  Acute deep vein thrombosis (DVT) of femoral vein of left lower extremity Pt's leg swelled-it is better today, much- and pt is concerned about it being her DVT. I assured her it was her DVT which I feel will swell on and off.  We know its in the femoral vein. As long as the swelling resolves, like it did today, she was OK. I told her she should be off her leg when it is swelling. There will always be a risk. Pt did not c/o pain.  Cellulitis Wound in leg is healing. There is an area of drainage centrally but no redness or heat so no cellulitis.  Time spent with pt> 35 minutes answering multiple questions  Hennie Duos, MD

## 2013-12-06 ENCOUNTER — Non-Acute Institutional Stay (SKILLED_NURSING_FACILITY): Payer: Medicare Other | Admitting: Nurse Practitioner

## 2013-12-06 DIAGNOSIS — S82402A Unspecified fracture of shaft of left fibula, initial encounter for closed fracture: Secondary | ICD-10-CM

## 2013-12-06 DIAGNOSIS — L0291 Cutaneous abscess, unspecified: Secondary | ICD-10-CM

## 2013-12-06 DIAGNOSIS — I5032 Chronic diastolic (congestive) heart failure: Secondary | ICD-10-CM

## 2013-12-06 DIAGNOSIS — I4891 Unspecified atrial fibrillation: Secondary | ICD-10-CM

## 2013-12-06 DIAGNOSIS — L0390 Cellulitis, unspecified: Secondary | ICD-10-CM

## 2013-12-06 DIAGNOSIS — S81812A Laceration without foreign body, left lower leg, initial encounter: Secondary | ICD-10-CM

## 2013-12-06 DIAGNOSIS — S82409A Unspecified fracture of shaft of unspecified fibula, initial encounter for closed fracture: Secondary | ICD-10-CM

## 2013-12-06 DIAGNOSIS — S81809A Unspecified open wound, unspecified lower leg, initial encounter: Secondary | ICD-10-CM

## 2013-12-06 DIAGNOSIS — I10 Essential (primary) hypertension: Secondary | ICD-10-CM

## 2013-12-06 NOTE — Progress Notes (Signed)
Patient ID: Shirley Strickland, female   DOB: 10/07/1915, 80 y.o.   MRN: [id number]    Nursing Home Location:  [place] of Service: SNF (31)  PCP: [name], MD  Allergies  Allergen Reactions  . Rocephin [Ceftriaxone Sodium In Dextrose]     Unable to breath  . Clarithromycin Nausea Only  . [employer] Allergy] Other (See Comments)    Stomach pains and passed out recently - doesn't remember reaction before this weekend  . Codeine Nausea And Vomiting  . Darvocet [Propoxyphene N-Acetaminophen] Nausea And Vomiting  . Demadex [Torsemide] Hives and Nausea And Vomiting    UNKNOWN  . Diovan Hct [Valsartan-Hydrochlorothiazide] Other (See Comments)    unknown  . Eggs Or Egg-Derived Products Nausea And Vomiting  . Ex-Lax [Senna] Other (See Comments)    unknown  . Ibuprofen Nausea Only  . Indanediones Other (See Comments)    unknown  . Indapamide Other (See Comments)    unknown  . Morphine And Related Nausea And Vomiting  . Sulfa Antibiotics Other (See Comments)    Couldn't breathe  . Benzonatate Itching and Rash    Eyes swelled  . Penicillins Rash  . Septra [Bactrim] Rash    Chief Complaint  Patient presents with  . Discharge Note    HPI:  80 year old female with pmh of afib, htn, who fell at home and fractured her fibula, and later sustained a wound to left calf which is requiring follow up by plastic surgery and ongoing wound care at [facility]; pt has now completed therapy and is ready to be discharged home with home health. Review of Systems:  Review of Systems  Constitutional: Negative for fever, chills and malaise/fatigue.  HENT: Negative for sore throat.   Eyes: Negative for blurred vision.  Respiratory: Positive for cough (associtated with allergies ). Negative for sputum production, shortness of breath and wheezing.   Cardiovascular: Negative for chest pain, palpitations and leg swelling.  Gastrointestinal: Negative for heartburn, nausea,  vomiting, abdominal pain, diarrhea and constipation.  Genitourinary: Negative for dysuria, urgency and frequency.  Musculoskeletal: Positive for myalgias (in bilateral legs).  Skin:       Stable Wound to left anterior shin   Neurological: Negative for dizziness, tingling, sensory change, weakness and headaches.  Endo/Heme/Allergies: Positive for environmental allergies.     Past Medical History  Diagnosis Date  . 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 Cancer     pt states only skin cancer  . Numbness of legs     started after fall yesterday 07/03/11  . Bruise     bruise on left foot from fall 07/03/11  . Knee pain, acute 07/04/11    Pt states left knee twisted under her in fall 07/03/11 and now painful  . Rash and nonspecific skin eruption 07/04/11    rash on bilateral upper legs  . Syncope   . Fall   . HTN (hypertension)   . A-fib    Past Surgical History  Procedure Laterality Date  . Elbow surgery      right  . Appendectomy    . Exploratory laparotomy  07/04/11    years ago to find cause of bleeding   Social History:   reports that she has never smoked. She has never used smokeless tobacco. She reports that she drinks about 1.2 ounces of alcohol per week. She reports that she does not use illicit drugs.  No family history on file.  Medications: Patient's Medications  New Prescriptions   No medications on file  Previous Medications   CALCIUM-VITAMIN D (OSCAL WITH D) 500-200 MG-UNIT PER TABLET    Take 2 tablets by mouth daily.   CHOLECALCIFEROL (VITAMIN D) 1000 UNITS TABLET    Take 1,000 Units by mouth daily.    DONEPEZIL (ARICEPT) 10 MG TABLET    Take 10 mg by mouth at bedtime.   LOTEPREDNOL (LOTEMAX) 0.5 % OPHTHALMIC SUSPENSION    Place 1 drop into both eyes daily as needed (itching).   METOPROLOL TARTRATE (LOPRESSOR) 12.5 MG TABS TABLET    Take 0.5 tablets (12.5 mg total) by mouth 2 (two) times daily.   POLYETHYL GLYCOL-PROPYL GLYCOL (SYSTANE OP)    Place 1 drop  into both eyes daily.   TELMISARTAN (MICARDIS) 80 MG TABLET    Take 80 mg by mouth daily.    Modified Medications   No medications on file  Discontinued Medications   FUROSEMIDE (LASIX) 20 MG TABLET    Take 0.5 tablets (10 mg total) by mouth every other day as needed for fluid or edema.     Physical Exam: Physical Exam  Constitutional: She is oriented to person, place, and time and well-developed, well-nourished, and in no distress.  HENT:  Head: Normocephalic and atraumatic.  Nose: Nose normal.  Mouth/Throat: Oropharynx is clear and moist. No oropharyngeal exudate.  Neck: Normal range of motion. Neck supple.  Cardiovascular: Normal rate, regular rhythm and normal heart sounds.   Pulmonary/Chest: Effort normal and breath sounds normal. No respiratory distress.  Abdominal: Soft. Bowel sounds are normal. She exhibits no distension.  Musculoskeletal: She exhibits tenderness. She exhibits no edema.  To lower extremities, chronic and stable  Neurological: She is alert and oriented to person, place, and time.  Skin: Skin is warm and dry. No rash noted. No erythema.  Psychiatric: Affect normal.    Filed Vitals:   12/06/13 1116  BP: 159/69  Pulse: 60  Temp: 96.5 F (35.8 C)  Resp: 20      Labs reviewed: Basic Metabolic Panel:  Recent Labs  09/26/13 0556 09/29/13 0530 09/30/13 0355  NA 129* 133* 132*  K 4.4 4.8 4.4  CL 94* 97 94*  CO2 22 23 25   GLUCOSE 104* 95 110*  BUN 21 27* 30*  CREATININE 1.01 0.92 1.07  CALCIUM 8.1* 9.2 9.4   Liver Function Tests:  Recent Labs  08/23/13 2008 09/23/13 2138  AST 15 10  ALT 10 8  ALKPHOS 46 82  BILITOT 0.6 0.3  PROT 6.2 6.1  ALBUMIN 3.0* 2.5*   No results found for this basename: LIPASE, AMYLASE,  in the last 8760 hours No results found for this basename: AMMONIA,  in the last 8760 hours CBC:  Recent Labs  08/23/13 2008  09/23/13 2138  09/27/13 0635 09/28/13 0550 09/29/13 0530  WBC 13.2*  < > 15.0*  < > 11.6*  9.6 9.4  NEUTROABS 12.1*  --  10.8*  --   --   --   --   HGB 10.1*  < > 9.5*  < > 8.0* 7.2* 9.6*  HCT 29.7*  < > 29.1*  < > 23.8* 21.7* 28.4*  MCV 85.8  < > 84.6  < > 82.9 83.1 82.6  PLT 176  < > 241  < > 215 219 257  < > = values in this interval not displayed.  Assessment/Plan 1. Chronic diastolic heart failure -not currently on diuretic, stable at present  2. HTN (  hypertension) -Patient is stable; continue metoprolol and telmisartan   3. Left fibular fracture -was followed by ortho, has regained strength while here with rehab; will need [facility] PT/OT on discharge for ongoing therapy   4. Cellulitis -resolved; treated with doxycycline last month, no signs of reoccurrence   5. A-fib -rate controlled, not currently on anticoagulation due to fall risk  6. Laceration of left leg -heeling with wound care, will get [facility] nursing for ongoing care and monitoring   pt is stable for discharge-will need PT/OT/Nursing per home health; DME needed is 3:1 B[facility]. Rx written.  will need to follow up with PCP within 2 weeks.

## 2013-12-09 ENCOUNTER — Non-Acute Institutional Stay (SKILLED_NURSING_FACILITY): Payer: Medicare Other | Admitting: Nurse Practitioner

## 2013-12-09 DIAGNOSIS — J988 Other specified respiratory disorders: Secondary | ICD-10-CM

## 2013-12-09 DIAGNOSIS — J22 Unspecified acute lower respiratory infection: Secondary | ICD-10-CM

## 2013-12-09 NOTE — Progress Notes (Addendum)
Patient ID: Shirley Strickland, female   DOB: 11/28/1915, 80 y.o.   MRN: [id number]    Nursing Home Location:  [place] [facility]: SN[facility] ([facility])  PCP: [name], MD  Allergies  Allergen Reactions  . Rocephin [Ceftriaxone Sodium In Dextrose]     Unable to breath  . Clarithromycin Nausea Only  . [employer] Allergy] Other (See Comments)    Stomach pains and passed out recently - doesn't remember reaction before this weekend  . Codeine Nausea And Vomiting  . Darvocet [Propoxyphene N-Acetaminophen] Nausea And Vomiting  . Demadex [Torsemide] Hives and Nausea And Vomiting    UNKNOWN  . Diovan Hct [Valsartan-Hydrochlorothiazide] Other (See Comments)    unknown  . Eggs Or Egg-Derived Products Nausea And Vomiting  . Ex-Lax [Senna] Other (See Comments)    unknown  . Ibuprofen Nausea Only  . Indanediones Other (See Comments)    unknown  . Indapamide Other (See Comments)    unknown  . Morphine And Related Nausea And Vomiting  . Sulfa Antibiotics Other (See Comments)    Couldn't breathe  . Benzonatate Itching and Rash    Eyes swelled  . Penicillins Rash  . Septra [Bactrim] Rash    Chief Complaint  Patient presents with  . Acute Visit    HPI:  80 year old female being seen today at the request of nursing. Pt with increase cough and congestion was placed on avelox over night and now she is having itching around her chest and does not feel right. Order was given for prednisone 20 mg PO times 1 and this has already been administered.  Pt reports she has not voiding in over 12 hours however staff states that she has.  Pt reports increase shortness of breath but contributes this to allergies. Pt had elevated temp last night of 100 but today has been afebrile.  Review of Systems:  Review of Systems  Constitutional: Positive for fever. Negative for chills and malaise/fatigue.  HENT: Negative for sore throat.   Eyes: Negative for blurred vision.  Respiratory:  Positive for cough (associtated with allergies ), shortness of breath and wheezing. Negative for sputum production.   Cardiovascular: Negative for chest pain, palpitations, orthopnea, claudication and leg swelling.  Gastrointestinal: Negative for heartburn, nausea, vomiting, abdominal pain, diarrhea and constipation.  Genitourinary: Negative for dysuria, urgency and frequency.  Musculoskeletal: Positive for myalgias (in bilateral legs).  Neurological: Negative for dizziness, tingling, sensory change, weakness and headaches.  Endo/Heme/Allergies: Positive for environmental allergies.     Past Medical History  Diagnosis Date  . 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 Cancer     pt states only skin cancer  . Numbness of legs     started after fall yesterday 07/03/11  . Bruise     bruise on left foot from fall 07/03/11  . Knee pain, acute 07/04/11    Pt states left knee twisted under her in fall 07/03/11 and now painful  . Rash and nonspecific skin eruption 07/04/11    rash on bilateral upper legs  . Syncope   . Fall   . HTN (hypertension)   . A-fib    Past Surgical History  Procedure Laterality Date  . Elbow surgery      right  . Appendectomy    . Exploratory laparotomy  07/04/11    years ago to find cause of bleeding   Social History:   reports that she has never smoked. She  has never used smokeless tobacco. She reports that she drinks about 1.2 ounces of alcohol per week. She reports that she does not use illicit drugs.  No family history on file.  Medications: Patient's Medications  New Prescriptions   No medications on file  Previous Medications   CALCIUM-VITAMIN D (OSCAL WITH D) 500-200 MG-UNIT PER TABLET    Take 2 tablets by mouth daily.   CHOLECALCIFEROL (VITAMIN D) 1000 UNITS TABLET    Take 1,000 Units by mouth daily.    DONEPEZIL (ARICEPT) 10 MG TABLET    Take 10 mg by mouth at bedtime.   LOTEPREDNOL (LOTEMAX) 0.5 % OPHTHALMIC SUSPENSION    Place 1 drop into both  eyes daily as needed (itching).   METOPROLOL TARTRATE (LOPRESSOR) 12.5 MG TABS TABLET    Take 0.5 tablets (12.5 mg total) by mouth 2 (two) times daily.   POLYETHYL GLYCOL-PROPYL GLYCOL (SYSTANE OP)    Place 1 drop into both eyes daily.   TELMISARTAN (MICARDIS) 80 MG TABLET    Take 80 mg by mouth daily.    Modified Medications   No medications on file  Discontinued Medications   No medications on file     Physical Exam:  Filed Vitals:   12/09/13 1720  BP: 121/60  Pulse: 88  Temp: 97 F (36.1 C)  Resp: 24  SpO2: 91%    Physical Exam  Constitutional: She is oriented to person, place, and time and well-developed, well-nourished, and in no distress.  HENT:  Head: Normocephalic and atraumatic.  Nose: Nose normal.  Mouth/Throat: Oropharynx is clear and moist. No oropharyngeal exudate.  Eyes: Conjunctivae and EOM are normal. Pupils are equal, round, and reactive to light.  Neck: Normal range of motion. Neck supple. No JVD present.  Cardiovascular: Normal rate, regular rhythm and normal heart sounds.   Pulmonary/Chest: No respiratory distress. She has wheezes.  Increase RR, wheezing throughout all lobes, decreased in the bases  Abdominal: Soft. Bowel sounds are normal. She exhibits no distension.  Musculoskeletal: She exhibits tenderness. She exhibits no edema.  Neurological: She is alert and oriented to person, place, and time.  Skin: Skin is warm and dry. No rash noted. No erythema.  Psychiatric:  Is somewhat more confused then baseline     Labs reviewed: Basic Metabolic Panel:  Recent Labs  09/26/13 0556 09/29/13 0530 09/30/13 0355  NA 129* 133* 132*  K 4.4 4.8 4.4  CL 94* 97 94*  CO2 22 23 25   GLUCOSE 104* 95 110*  BUN 21 27* 30*  CREATININE 1.01 0.92 1.07  CALCIUM 8.1* 9.2 9.4   Liver Function Tests:  Recent Labs  08/23/13 2008 09/23/13 2138  AST 15 10  ALT 10 8  ALKPHOS 46 82  BILITOT 0.6 0.3  PROT 6.2 6.1  ALBUMIN 3.0* 2.5*   No results found for  this basename: LIPASE, AMYLASE,  in the last 8760 hours No results found for this basename: AMMONIA,  in the last 8760 hours CBC:  Recent Labs  08/23/13 2008  09/23/13 2138  09/27/13 0635 09/28/13 0550 09/29/13 0530  WBC 13.2*  < > 15.0*  < > 11.6* 9.6 9.4  NEUTROABS 12.1*  --  10.8*  --   --   --   --   HGB 10.1*  < > 9.5*  < > 8.0* 7.2* 9.6*  HCT 29.7*  < > 29.1*  < > 23.8* 21.7* 28.4*  MCV 85.8  < > 84.6  < > 82.9 83.1 82.6  PLT 176  < > 241  < > 215 219 257  < > = values in this interval not displayed.   Assessment/Plan 1. Lower respiratory infection (e.g., bronchitis, pneumonia, pneumonitis, pulmonitis) -pt NOT to discharge today- will need evaluation before discharge  -will get chest xray now-- pneumonia vs chf exacerbation vs asthma -will start prednisone 60 mg for 3 days then to decrease by 10mg  daily until course completed, 60, 60, 60, 50, 40, 30, 20, 10  -deuonebs q 6 hours scheduled  -O2 at 2 L to keep sats above 92% -start doxycycline 100 mg BID -stat BMP, CBC -mucinex DM BID with full glass of water -florastore BID

## 2013-12-13 ENCOUNTER — Non-Acute Institutional Stay (INDEPENDENT_AMBULATORY_CARE_PROVIDER_SITE_OTHER): Payer: Medicare Other | Admitting: Nurse Practitioner

## 2013-12-13 DIAGNOSIS — I5032 Chronic diastolic (congestive) heart failure: Secondary | ICD-10-CM

## 2013-12-13 DIAGNOSIS — N179 Acute kidney failure, unspecified: Secondary | ICD-10-CM

## 2013-12-13 DIAGNOSIS — J189 Pneumonia, unspecified organism: Secondary | ICD-10-CM

## 2013-12-13 NOTE — Progress Notes (Signed)
Patient ID: Shirley Strickland, female   DOB: 05/04/1916, 80 y.o.   MRN: [id number]    Nursing Home Location:  [place] [facility] Service: SNF (31)  PCP: [name], MD  Allergies  Allergen Reactions  . Rocephin [Ceftriaxone Sodium In Dextrose]     Unable to breath  . Clarithromycin Nausea Only  . [employer] Allergy] Other (See Comments)    Stomach pains and passed out recently - doesn't remember reaction before this weekend  . Codeine Nausea And Vomiting  . Darvocet [Propoxyphene N-Acetaminophen] Nausea And Vomiting  . Demadex [Torsemide] Hives and Nausea And Vomiting    UNKNOWN  . Diovan Hct [Valsartan-Hydrochlorothiazide] Other (See Comments)    unknown  . Eggs Or Egg-Derived Products Nausea And Vomiting  . Ex-Lax [Senna] Other (See Comments)    unknown  . Ibuprofen Nausea Only  . Indanediones Other (See Comments)    unknown  . Indapamide Other (See Comments)    unknown  . Morphine And Related Nausea And Vomiting  . Sulfa Antibiotics Other (See Comments)    Couldn't breathe  . Benzonatate Itching and Rash    Eyes swelled  . Penicillins Rash  . Septra [Bactrim] Rash    Chief Complaint  Patient presents with  . Discharge Note    HPI:  80 year old female who was scheduled for discharge home 4 days ago but due to increase RR, congestion and wheezing stayed at [facility] over the weekend, pt was also found to be in Acute renal failure which resolved with IVF over the weekend. She has been off IV fluids for 24 hours and renal function and sodium have stabilized. pts pulmonary status has improved since she has been on prednisone, doxycyline and duonebs (although pt feels like she does not react good to neb treatments) Pt has been afebrile, denies shortness of breath or chest pain. Reports no dysuria and nursing has kept output on pt and is adequate. Patient currently doing well with care, now stable to discharge home with home health.   Review of  Systems:  Review of Systems  Constitutional: Negative for fever, chills and malaise/fatigue.  HENT: Negative for sore throat.   Eyes: Negative for blurred vision.  Respiratory: Positive for cough (has improved). Negative for sputum production, shortness of breath and wheezing.   Cardiovascular: Negative for chest pain, palpitations, orthopnea, claudication and leg swelling.  Gastrointestinal: Negative for heartburn, nausea, vomiting, abdominal pain, diarrhea and constipation.  Genitourinary: Negative for dysuria, urgency and frequency.  Neurological: Negative for dizziness, tingling, sensory change, weakness and headaches.  Endo/Heme/Allergies: Positive for environmental allergies.     Past Medical History  Diagnosis Date  . 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 Cancer     pt states only skin cancer  . Numbness of legs     started after fall yesterday 07/03/11  . Bruise     bruise on left foot from fall 07/03/11  . Knee pain, acute 07/04/11    Pt states left knee twisted under her in fall 07/03/11 and now painful  . Rash and nonspecific skin eruption 07/04/11    rash on bilateral upper legs  . Syncope   . Fall   . HTN (hypertension)   . A-fib    Past Surgical History  Procedure Laterality Date  . Elbow surgery      right  . Appendectomy    . Exploratory laparotomy  07/04/11    years ago to  find cause of bleeding   Social History:   reports that she has never smoked. She has never used smokeless tobacco. She reports that she drinks about 1.2 ounces of alcohol per week. She reports that she does not use illicit drugs.  No family history on file.  Medications: Patient's Medications  New Prescriptions   No medications on file  Previous Medications   CALCIUM-VITAMIN D (OSCAL WITH D) 500-200 MG-UNIT PER TABLET    Take 2 tablets by mouth daily.   CHOLECALCIFEROL (VITAMIN D) 1000 UNITS TABLET    Take 1,000 Units by mouth daily.    DONEPEZIL (ARICEPT) 10 MG TABLET    Take 10  mg by mouth at bedtime.   DOXYCYCLINE (VIBRAMYCIN) 100 MG CAPSULE    Take 100 mg by mouth 2 (two) times daily.   LOTEPREDNOL (LOTEMAX) 0.5 % OPHTHALMIC SUSPENSION    Place 1 drop into both eyes daily as needed (itching).   METOPROLOL TARTRATE (LOPRESSOR) 12.5 MG TABS TABLET    Take 0.5 tablets (12.5 mg total) by mouth 2 (two) times daily.   POLYETHYL GLYCOL-PROPYL GLYCOL (SYSTANE OP)    Place 1 drop into both eyes daily.   PREDNISONE (DELTASONE) 10 MG TABLET    Take 10 mg by mouth daily with breakfast. 60,60,60,50,40,20,10   TELMISARTAN (MICARDIS) 80 MG TABLET    Take 80 mg by mouth daily.    Modified Medications   No medications on file  Discontinued Medications   No medications on file     Physical Exam:  Filed Vitals:   12/13/13 1350  BP: 142/74  Pulse: 64  Temp: 98.1 F (36.7 C)  Resp: 18  SpO2: 97%   Physical Exam  Constitutional: She is oriented to person, place, and time and well-developed, well-nourished, and in no distress.  HENT:  Head: Normocephalic and atraumatic.  Nose: Nose normal.  Mouth/Throat: Oropharynx is clear and moist. No oropharyngeal exudate.  Eyes: Conjunctivae and EOM are normal. Pupils are equal, round, and reactive to light.  Neck: Normal range of motion. Neck supple. No JVD present.  Cardiovascular: Normal rate, regular rhythm and normal heart sounds.   Pulmonary/Chest: Effort normal. No respiratory distress.   Wheezing occasionally in upper lobes, has improved   Abdominal: Soft. Bowel sounds are normal. She exhibits no distension.  Musculoskeletal: She exhibits tenderness. She exhibits no edema.  Neurological: She is alert and oriented to person, place, and time.  Skin: Skin is warm and dry. No rash noted. No erythema.  Psychiatric: Memory and affect normal.      Labs reviewed: Basic Metabolic Panel:  Recent Labs  09/26/13 0556 09/29/13 0530 09/30/13 0355  NA 129* 133* 132*  K 4.4 4.8 4.4  CL 94* 97 94*  CO2 $Re'22 23 25  'FXO$ GLUCOSE 104*  95 110*  BUN 21 27* 30*  CREATININE 1.01 0.92 1.07  CALCIUM 8.1* 9.2 9.4   Liver Function Tests:  Recent Labs  08/23/13 2008 09/23/13 2138  AST 15 10  ALT 10 8  ALKPHOS 46 82  BILITOT 0.6 0.3  PROT 6.2 6.1  ALBUMIN 3.0* 2.5*   No results found for this basename: LIPASE, AMYLASE,  in the last 8760 hours No results found for this basename: AMMONIA,  in the last 8760 hours CBC:  Recent Labs  08/23/13 2008  09/23/13 2138  09/27/13 0635 09/28/13 0550 09/29/13 0530  WBC 13.2*  < > 15.0*  < > 11.6* 9.6 9.4  NEUTROABS 12.1*  --  10.8*  --   --   --   --  HGB 10.1*  < > 9.5*  < > 8.0* 7.2* 9.6*  HCT 29.7*  < > 29.1*  < > 23.8* 21.7* 28.4*  MCV 85.8  < > 84.6  < > 82.9 83.1 82.6  PLT 176  < > 241  < > 215 219 257  < > = values in this interval not displayed. CBC with Diff    Result: 12/10/2013 4:42 PM   ( Status: F )       WBC 9.3     4.0-10.5 K/uL SLN   RBC 3.44   L 3.87-5.11 MIL/uL SLN   Hemoglobin 9.4   L 12.0-15.0 g/dL SLN   Hematocrit 27.4   L 36.0-46.0 % SLN   MCV 79.7     78.0-100.0 fL SLN   MCH 27.3     26.0-34.0 pg SLN   MCHC 34.3     30.0-36.0 g/dL SLN   RDW 14.6     11.5-15.5 % SLN   Platelet Count 179     150-400 K/uL SLN   Granulocyte % 86   H 43-77 % SLN   Absolute Gran 8.0   H 1.7-7.7 K/uL SLN   Lymph % 8   L 12-46 % SLN   Absolute Lymph 0.7     0.7-4.0 K/uL SLN   Mono % 6     3-12 % SLN   Absolute Mono 0.6     0.1-1.0 K/uL SLN   Eos % 0     0-5 % SLN   Absolute Eos 0.0     0.0-0.7 K/uL SLN   Baso % 0     0-1 % SLN   Absolute Baso 0.0     0.0-0.1 K/uL SLN   Smear Review Criteria for review not met  SLN   Basic Metabolic Panel    Result: 12/10/2013 4:31 PM   ( Status: F )       Sodium 126   L 135-145 mEq/L SLN   Potassium 4.5     3.5-5.3 mEq/L SLN   Chloride 94   L 96-112 mEq/L SLN   CO2 17   L 19-32 mEq/L SLN   Glucose 227   H 70-99 mg/dL SLN   BUN 25   H 6-23 mg/dL SLN   Creatinine 1.20   H 0.50-1.10 mg/dL SLN   Calcium 8.1    L 8.4-10.5 mg/dL SLN Basic Metabolic Panel    Result: 12/12/2013 10:25 AM   ( Status: F )       Sodium 128   L 135-145 mEq/L SLN   Potassium 4.4     3.5-5.3 mEq/L SLN   Chloride 96     96-112 mEq/L SLN   CO2 23     19-32 mEq/L SLN   Glucose 117   H 70-99 mg/dL SLN   BUN 29   H 6-23 mg/dL SLN   Creatinine 1.10     0.50-1.10 mg/dL SLN   Calcium 8.4 Basic Metabolic Panel    Result: 12/13/2013 12:24 PM   ( Status: F )       Sodium 128   L 135-145 mEq/L SLN   Potassium 4.1     3.5-5.3 mEq/L SLN   Chloride 97     96-112 mEq/L SLN   CO2 21     19-32 mEq/L SLN   Glucose 130   H 70-99 mg/dL SLN   BUN 22     6-23 mg/dL SLN   Creatinine 0.86  0.50-1.10 mg/dL SLN   Calcium 8.5     Assessment/Plan 1. Chronic diastolic heart failure -remains stable   2. Acute renal failure -resolved with IVF, stat BMP done today and BUN and Cr back to baseline -encouraged good hydration at home -will have this followed up with PCP   3. Pneumonia -conts on doxycycline, has completed 4 days, will need 3 additional days at home.  -will give Rx for doxycycline and prednisone taper with 3 additional dose, will give rx for albuterol inhaler q 6 hours PRN wheezing/cough/shortness of breath -off o2 and sats remain stable -will need outpt follow up in 1 week   pt is stable for discharge-will need PT/OT/Nursing per home health. Rx written.  will need to follow up with PCP within 1 weeks regarding ARF and pneumonia.

## 2013-12-28 ENCOUNTER — Inpatient Hospital Stay (HOSPITAL_COMMUNITY)
Admission: EM | Admit: 2013-12-28 | Discharge: 2013-12-30 | DRG: 300 | Disposition: A | Discharge status: Home Health | Payer: Medicare Other | Attending: Internal Medicine | Admitting: Internal Medicine

## 2013-12-28 ENCOUNTER — Encounter (HOSPITAL_COMMUNITY): Payer: Self-pay | Admitting: Emergency Medicine

## 2013-12-28 ENCOUNTER — Emergency Department (HOSPITAL_COMMUNITY): Payer: Medicare Other

## 2013-12-28 DIAGNOSIS — S81812A Laceration without foreign body, left lower leg, initial encounter: Secondary | ICD-10-CM

## 2013-12-28 DIAGNOSIS — I872 Venous insufficiency (chronic) (peripheral): Secondary | ICD-10-CM | POA: Diagnosis present

## 2013-12-28 DIAGNOSIS — L02419 Cutaneous abscess of limb, unspecified: Secondary | ICD-10-CM | POA: Diagnosis present

## 2013-12-28 DIAGNOSIS — N189 Chronic kidney disease, unspecified: Secondary | ICD-10-CM | POA: Diagnosis present

## 2013-12-28 DIAGNOSIS — I82509 Chronic embolism and thrombosis of unspecified deep veins of unspecified lower extremity: Secondary | ICD-10-CM | POA: Diagnosis present

## 2013-12-28 DIAGNOSIS — I129 Hypertensive chronic kidney disease with stage 1 through stage 4 chronic kidney disease, or unspecified chronic kidney disease: Secondary | ICD-10-CM | POA: Diagnosis present

## 2013-12-28 DIAGNOSIS — R262 Difficulty in walking, not elsewhere classified: Secondary | ICD-10-CM | POA: Diagnosis present

## 2013-12-28 DIAGNOSIS — K219 Gastro-esophageal reflux disease without esophagitis: Secondary | ICD-10-CM | POA: Diagnosis present

## 2013-12-28 DIAGNOSIS — E236 Other disorders of pituitary gland: Secondary | ICD-10-CM | POA: Diagnosis present

## 2013-12-28 DIAGNOSIS — M7989 Other specified soft tissue disorders: Secondary | ICD-10-CM

## 2013-12-28 DIAGNOSIS — I10 Essential (primary) hypertension: Secondary | ICD-10-CM | POA: Diagnosis present

## 2013-12-28 DIAGNOSIS — Z85828 Personal history of other malignant neoplasm of skin: Secondary | ICD-10-CM

## 2013-12-28 DIAGNOSIS — I82409 Acute embolism and thrombosis of unspecified deep veins of unspecified lower extremity: Secondary | ICD-10-CM | POA: Diagnosis present

## 2013-12-28 DIAGNOSIS — M545 Low back pain, unspecified: Secondary | ICD-10-CM | POA: Diagnosis present

## 2013-12-28 DIAGNOSIS — I824Z9 Acute embolism and thrombosis of unspecified deep veins of unspecified distal lower extremity: Secondary | ICD-10-CM | POA: Diagnosis present

## 2013-12-28 DIAGNOSIS — M79609 Pain in unspecified limb: Secondary | ICD-10-CM

## 2013-12-28 DIAGNOSIS — Z87828 Personal history of other (healed) physical injury and trauma: Secondary | ICD-10-CM

## 2013-12-28 DIAGNOSIS — I509 Heart failure, unspecified: Secondary | ICD-10-CM | POA: Diagnosis present

## 2013-12-28 DIAGNOSIS — I5032 Chronic diastolic (congestive) heart failure: Secondary | ICD-10-CM | POA: Diagnosis present

## 2013-12-28 DIAGNOSIS — I825Z9 Chronic embolism and thrombosis of unspecified deep veins of unspecified distal lower extremity: Secondary | ICD-10-CM

## 2013-12-28 DIAGNOSIS — I82412 Acute embolism and thrombosis of left femoral vein: Secondary | ICD-10-CM

## 2013-12-28 DIAGNOSIS — Z86718 Personal history of other venous thrombosis and embolism: Secondary | ICD-10-CM

## 2013-12-28 DIAGNOSIS — Z9181 History of falling: Secondary | ICD-10-CM

## 2013-12-28 DIAGNOSIS — I824Y9 Acute embolism and thrombosis of unspecified deep veins of unspecified proximal lower extremity: Principal | ICD-10-CM | POA: Diagnosis present

## 2013-12-28 DIAGNOSIS — M899 Disorder of bone, unspecified: Secondary | ICD-10-CM | POA: Diagnosis present

## 2013-12-28 DIAGNOSIS — I4891 Unspecified atrial fibrillation: Secondary | ICD-10-CM | POA: Diagnosis present

## 2013-12-28 DIAGNOSIS — D649 Anemia, unspecified: Secondary | ICD-10-CM | POA: Diagnosis present

## 2013-12-28 DIAGNOSIS — L03119 Cellulitis of unspecified part of limb: Secondary | ICD-10-CM

## 2013-12-28 DIAGNOSIS — Z79899 Other long term (current) drug therapy: Secondary | ICD-10-CM

## 2013-12-28 DIAGNOSIS — W19XXXA Unspecified fall, initial encounter: Secondary | ICD-10-CM

## 2013-12-28 DIAGNOSIS — Y92009 Unspecified place in unspecified non-institutional (private) residence as the place of occurrence of the external cause: Secondary | ICD-10-CM

## 2013-12-28 DIAGNOSIS — Z9849 Cataract extraction status, unspecified eye: Secondary | ICD-10-CM

## 2013-12-28 DIAGNOSIS — L0390 Cellulitis, unspecified: Secondary | ICD-10-CM | POA: Diagnosis present

## 2013-12-28 DIAGNOSIS — M949 Disorder of cartilage, unspecified: Secondary | ICD-10-CM

## 2013-12-28 HISTORY — DX: Acute embolism and thrombosis of unspecified deep veins of unspecified lower extremity: I82.409

## 2013-12-28 HISTORY — DX: Heart failure, unspecified: I50.9

## 2013-12-28 HISTORY — DX: Gastro-esophageal reflux disease without esophagitis: K21.9

## 2013-12-28 HISTORY — DX: Pneumonia, unspecified organism: J18.9

## 2013-12-28 HISTORY — DX: Other specified disorders of bone density and structure, unspecified site: M85.80

## 2013-12-28 LAB — URINALYSIS, ROUTINE W REFLEX MICROSCOPIC
Bilirubin Urine: NEGATIVE
Glucose, UA: NEGATIVE mg/dL
Hgb urine dipstick: NEGATIVE
Ketones, ur: NEGATIVE mg/dL
NITRITE: NEGATIVE
Protein, ur: NEGATIVE mg/dL
SPECIFIC GRAVITY, URINE: 1.009 (ref 1.005–1.030)
Urobilinogen, UA: 0.2 mg/dL (ref 0.0–1.0)
pH: 7 (ref 5.0–8.0)

## 2013-12-28 LAB — CBC WITH DIFFERENTIAL/PLATELET
BASOS ABS: 0 10*3/uL (ref 0.0–0.1)
BASOS PCT: 0 % (ref 0–1)
EOS ABS: 0.1 10*3/uL (ref 0.0–0.7)
Eosinophils Relative: 2 % (ref 0–5)
HCT: 31.8 % — ABNORMAL LOW (ref 36.0–46.0)
HEMOGLOBIN: 10.1 g/dL — AB (ref 12.0–15.0)
Lymphocytes Relative: 24 % (ref 12–46)
Lymphs Abs: 1.3 10*3/uL (ref 0.7–4.0)
MCH: 26.9 pg (ref 26.0–34.0)
MCHC: 31.8 g/dL (ref 30.0–36.0)
MCV: 84.8 fL (ref 78.0–100.0)
MONOS PCT: 13 % — AB (ref 3–12)
Monocytes Absolute: 0.7 10*3/uL (ref 0.1–1.0)
NEUTROS PCT: 61 % (ref 43–77)
Neutro Abs: 3.4 10*3/uL (ref 1.7–7.7)
PLATELETS: 226 10*3/uL (ref 150–400)
RBC: 3.75 MIL/uL — ABNORMAL LOW (ref 3.87–5.11)
RDW: 14.7 % (ref 11.5–15.5)
WBC: 5.6 10*3/uL (ref 4.0–10.5)

## 2013-12-28 LAB — URINE MICROSCOPIC-ADD ON

## 2013-12-28 LAB — COMPREHENSIVE METABOLIC PANEL
ALBUMIN: 3.2 g/dL — AB (ref 3.5–5.2)
ALK PHOS: 77 U/L (ref 39–117)
ALT: 11 U/L (ref 0–35)
AST: 15 U/L (ref 0–37)
BUN: 11 mg/dL (ref 6–23)
CO2: 26 mEq/L (ref 19–32)
Calcium: 10 mg/dL (ref 8.4–10.5)
Chloride: 94 mEq/L — ABNORMAL LOW (ref 96–112)
Creatinine, Ser: 0.85 mg/dL (ref 0.50–1.10)
GFR calc Af Amer: 65 mL/min — ABNORMAL LOW (ref >90)
GFR calc non Af Amer: 56 mL/min — ABNORMAL LOW (ref >90)
Glucose, Bld: 128 mg/dL — ABNORMAL HIGH (ref 70–99)
POTASSIUM: 4.5 meq/L (ref 3.7–5.3)
Sodium: 132 mEq/L — ABNORMAL LOW (ref 137–147)
TOTAL PROTEIN: 6.5 g/dL (ref 6.0–8.3)
Total Bilirubin: 0.3 mg/dL (ref 0.3–1.2)

## 2013-12-28 LAB — I-STAT CG4 LACTIC ACID, ED: Lactic Acid, Venous: 1.42 mmol/L (ref 0.5–2.2)

## 2013-12-28 LAB — PRO B NATRIURETIC PEPTIDE: PRO B NATRI PEPTIDE: 53.4 pg/mL (ref 0–450)

## 2013-12-28 MED ORDER — ACETAMINOPHEN 325 MG PO TABS: 650.0000 mg | ORAL_TABLET | ORAL | Status: DC | PRN

## 2013-12-28 MED ORDER — ENOXAPARIN SODIUM 40 MG/0.4ML ~~LOC~~ SOLN
40.0000 mg | SUBCUTANEOUS | Status: DC
  Administered 2013-12-28 – 2013-12-29 (×2): 40 mg via SUBCUTANEOUS
  Filled 2013-12-28 (×4): qty 0.4

## 2013-12-28 MED ORDER — ALBUTEROL SULFATE (2.5 MG/3ML) 0.083% IN NEBU: 2.5000 mg | INHALATION_SOLUTION | Freq: Two times a day (BID) | RESPIRATORY_TRACT | Status: DC

## 2013-12-28 MED ORDER — LOTEPREDNOL ETABONATE 0.5 % OP SUSP: 1.0000 [drp] | Freq: Every day | OPHTHALMIC | Status: DC | PRN

## 2013-12-28 MED ORDER — AMLODIPINE BESYLATE 5 MG PO TABS: 5.0000 mg | ORAL_TABLET | Freq: Every day | ORAL | Status: DC

## 2013-12-28 MED ORDER — LABETALOL HCL 5 MG/ML IV SOLN
10.0000 mg | Freq: Once | INTRAVENOUS | Status: AC
  Administered 2013-12-28: 7 mg via INTRAVENOUS
  Filled 2013-12-28: qty 4

## 2013-12-28 MED ORDER — SODIUM CHLORIDE 0.9 % IV SOLN
Freq: Once | INTRAVENOUS | Status: AC
  Administered 2013-12-28: 15:00:00 via INTRAVENOUS

## 2013-12-28 MED ORDER — SODIUM CHLORIDE 0.9 % IJ SOLN
3.0000 mL | Freq: Two times a day (BID) | INTRAMUSCULAR | Status: DC
  Administered 2013-12-29: 3 mL via INTRAVENOUS

## 2013-12-28 MED ORDER — VITAMIN D3 25 MCG (1000 UNIT) PO TABS
1000.0000 [IU] | ORAL_TABLET | Freq: Every day | ORAL | Status: DC
  Administered 2013-12-29 – 2013-12-30 (×2): 1000 [IU] via ORAL
  Filled 2013-12-28 (×2): qty 1

## 2013-12-28 MED ORDER — ONDANSETRON HCL 4 MG/2ML IJ SOLN: 4.0000 mg | Freq: Four times a day (QID) | INTRAMUSCULAR | Status: DC | PRN

## 2013-12-28 MED ORDER — CALCIUM CARBONATE-VITAMIN D 500-200 MG-UNIT PO TABS
2.0000 | ORAL_TABLET | Freq: Every day | ORAL | Status: DC
  Administered 2013-12-29 – 2013-12-30 (×2): 2 via ORAL
  Filled 2013-12-28 (×4): qty 2

## 2013-12-28 MED ORDER — ALBUTEROL SULFATE HFA 108 (90 BASE) MCG/ACT IN AERS: 1.0000 | INHALATION_SPRAY | Freq: Two times a day (BID) | RESPIRATORY_TRACT | Status: DC

## 2013-12-28 MED ORDER — SPIRITUS FRUMENTI
1.0000 | Freq: Every day | ORAL | Status: DC
  Filled 2013-12-28 (×5): qty 1

## 2013-12-28 MED ORDER — AMLODIPINE BESYLATE 5 MG PO TABS
5.0000 mg | ORAL_TABLET | Freq: Every day | ORAL | Status: DC
  Administered 2013-12-28 – 2013-12-30 (×4): 5 mg via ORAL
  Filled 2013-12-28 (×4): qty 1

## 2013-12-28 MED ORDER — ALBUTEROL SULFATE (2.5 MG/3ML) 0.083% IN NEBU: 3.0000 mL | INHALATION_SOLUTION | Freq: Two times a day (BID) | RESPIRATORY_TRACT | Status: DC | PRN

## 2013-12-28 MED ORDER — DOXYCYCLINE HYCLATE 100 MG PO TABS
100.0000 mg | ORAL_TABLET | Freq: Two times a day (BID) | ORAL | Status: DC
  Administered 2013-12-28 – 2013-12-30 (×4): 100 mg via ORAL
  Filled 2013-12-28 (×6): qty 1

## 2013-12-28 MED ORDER — IRBESARTAN 300 MG PO TABS: 300.0000 mg | ORAL_TABLET | Freq: Every day | ORAL | Status: DC

## 2013-12-28 MED ORDER — METOPROLOL TARTRATE 12.5 MG HALF TABLET
12.5000 mg | ORAL_TABLET | Freq: Two times a day (BID) | ORAL | Status: DC
  Administered 2013-12-28 – 2013-12-30 (×4): 12.5 mg via ORAL
  Filled 2013-12-28 (×6): qty 1

## 2013-12-28 NOTE — ED Notes (Addendum)
PT returned from Vascular/Radiology

## 2013-12-28 NOTE — Progress Notes (Signed)
Bilateral lower extremity venous duplex.  Right:  No evidence of DVT, superficial thrombosis, or Baker's cyst.  Left: DVT noted in the common femoral, profunda, and femoral veins.  No evidence of superficial thrombosis.  No Baker's cyst.

## 2013-12-28 NOTE — ED Notes (Signed)
PT presents via [facility] from home c/o left leg edema and possible infection. Pt reports she fell on December 30 and injured her leg and the skin has not healed properly since then. She states her physician saw her at home today and sent her via [facility] to the ER because he thinks that the leg is infected. Pt is A&Ox4 and interactive, in NAD.

## 2013-12-28 NOTE — Consult Note (Signed)
Vascular Surgery Consultation  Reason for Consult: DVT left leg-recurrent  HPI: Shirley Strickland is a 80 y.o. female who presents for evaluation of DVT left leg. This patient had a fall in late December 2014 and fractured her left leg. She avulsed skin from the leg. She had a DVT in the left common femoral, popliteal, and posterior tibial vein. Attempt was made to treat her with anti-coagulation which was unsuccessful because she had dropping hemoglobin-etiology unknown. She had IVC filter placed by radiology in February 2015. She has been in rehabilitation and lives independently alone at home and was discharged home on April 21. She has had continued redness in the left leg without chills and fever and chronic swelling in the left leg which has not resolved. She is unable to wear elastic compression stockings. She does elevate her legs when she can. She has had multiple procedures on the leg by plastic surgery because of the skin loss and that seems to be greatly improved. She has no history of previous DVT or pulmonary embolus.     Past Medical History  Diagnosis Date  . 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 Cancer     pt states only skin cancer  . Numbness of legs     started after fall yesterday 07/03/11  . Bruise     bruise on left foot from fall 07/03/11  . Knee pain, acute 07/04/11    Pt states left knee twisted under her in fall 07/03/11 and now painful  . Rash and nonspecific skin eruption 07/04/11    rash on bilateral upper legs  . Syncope   . Fall   . HTN (hypertension)   . A-fib   . Pneumonia   . Osteopenia   . DVT (deep venous thrombosis) 12/28/2013    LEFT   . CHF (congestive heart failure)   . GERD (gastroesophageal reflux disease)    Past Surgical History  Procedure Laterality Date  . Elbow surgery      right  . Appendectomy    . Exploratory laparotomy  07/04/11    years ago to find cause of bleeding  . Cataract extraction     History   Social History  .  Marital Status: Widowed    Spouse Name: N/A    Number of Children: N/A  . Years of Education: N/A   Social History Main Topics  . Smoking status: Never Smoker   . Smokeless tobacco: Never Used  . Alcohol Use: 1.2 oz/week    2 Cans of beer per week     Comment: 2 cans beer per day   12/28/2013  DRINKS ONLY  1 CAN A DAY  . Drug Use: No  . Sexual Activity: No   Other Topics Concern  . None   Social History Narrative  . None   History reviewed. No pertinent family history. Allergies  Allergen Reactions  . Rocephin [Ceftriaxone Sodium In Dextrose]     Unable to breath  . Clarithromycin Nausea Only  . [employer] Allergy] Other (See Comments)    Stomach pains and passed out recently - doesn't remember reaction before this weekend  . Codeine Nausea And Vomiting  . Darvocet [Propoxyphene N-Acetaminophen] Nausea And Vomiting  . Demadex [Torsemide] Hives and Nausea And Vomiting    UNKNOWN  . Diovan Hct [Valsartan-Hydrochlorothiazide] Other (See Comments)    Nausea, sick, hives   . Eggs Or Egg-Derived Products Nausea And Vomiting  . Ex-Lax [Senna] Other (See Comments)  unknown  . Ibuprofen Nausea Only  . Indanediones Other (See Comments)    unknown  . Indapamide Other (See Comments)    unknown  . Morphine And Related Nausea And Vomiting  . Sulfa Antibiotics Other (See Comments)    Couldn't breathe pt states that she was in a coma foe 3 weeks   . Benzonatate Itching and Rash    Eyes swelled  . Penicillins Rash  . Septra [Bactrim] Rash   Prior to Admission medications   Medication Sig Start Date End Date Taking? Authorizing Provider  calcium-vitamin D (OSCAL WITH D) 500-200 MG-UNIT per tablet Take 2 tablets by mouth daily.   Yes Historical Provider, MD  cholecalciferol (VITAMIN D) 1000 UNITS tablet Take 1,000 Units by mouth daily.    Yes Historical Provider, MD  loteprednol (LOTEMAX) 0.5 % ophthalmic suspension Place 1 drop into both eyes daily as needed (itching).   Yes  Historical Provider, MD  metoprolol tartrate (LOPRESSOR) 12.5 mg TABS tablet Take 0.5 tablets (12.5 mg total) by mouth 2 (two) times daily. 08/29/13  Yes Kinnie Feil, MD  Polyethyl Glycol-Propyl Glycol (SYSTANE OP) Place 1 drop into both eyes daily as needed (dry eyes).    Yes Historical Provider, MD  PROAIR HFA 108 (90 BASE) MCG/ACT inhaler Inhale 1 puff into the lungs 2 (two) times daily.  12/13/13  Yes Historical Provider, MD  spiritus frumenti (ETHYL ALCOHOL) SOLN Take 1 each by mouth 2 (two) times daily. Pt drinks budweiser beer twice daily   Yes Historical Provider, MD  telmisartan (MICARDIS) 80 MG tablet Take 80 mg by mouth daily.     Yes Historical Provider, MD     Positive ROS: Denies active chest pain does not ambulate much and does so with a walker. Has chronic swelling in the left leg with improved skin loss as noted  All other systems have been reviewed and were otherwise negative with the exception of those mentioned in the HPI and as above.  Physical Exam: Filed Vitals:   12/28/13 1608  BP: 188/55  Pulse: 63  Temp: 97.9 F (36.6 C)  Resp: 15    General: Alert, no acute distress HEENT: Normal for age-elderly and frail but appropriate for age Cardiovascular: Regular rate and rhythm. Carotid pulses 2+, no bruits audible Respiratory: Clear to auscultation. No cyanosis, no use of accessory musculature GI: No organomegaly, abdomen is soft and non-tender Neurologic: Sensation intact distally Psychiatric: Patient is competent for consent with normal mood and affect Musculoskeletal: No obvious deformities Extremities: Right leg with 3+ femoral and 2 posterior cells pedis pulse and 1+ edema throughout Left leg with 3+ femoral 2+ dorsalis pedis pulse palpable. 1-2+ edema throughout left leg. Erythema on the medial aspect of left leg from ankle to mid thigh. No deep ulcerations noted in the leg but thin skin and pretibial region with evidence of recent healing.  Labs reviewed:  Venous duplex exam performed today by vascular lab technologist reveals DVT in the left femoral vein and popliteal vein which may well be chronic and residual from previous DVT    Assessment/Plan:  80 year old patient with fall in December and subsequent DVT unable to be treated with anticoagulation or elastic compression stockings. Has IVC filter in place. Has persistent edema and slowly healing skin left leg from a avulsion injury.  Would recommend elevation of legs 3 inches at night and frequently during the day and treatment of low-grade cellulitis with antibiotics. No further recommendation since patient cannot wear elastic compression stockings  and is not a candidate for anti-coagulation but is protected with IVC filter Discuss this with the patient and she understands   Tinnie Gens, MD 12/28/2013 6:25 PM

## 2013-12-28 NOTE — ED Provider Notes (Addendum)
CSN: [id number]     Arrival date & time 12/28/13  1100 History   First MD Initiated Contact with Patient 12/28/13 1105     Chief Complaint  Patient presents with  . Leg Swelling     (Consider location/radiation/quality/duration/timing/severity/associated sxs/prior Treatment) HPI Comments: Patient presents to the ER for evaluation of pain and swelling of the left leg. Patient comes from home by EMS. Patient reports a history of injury to the left leg requiring multiple surgeries for her nonhealing wound. She has had a DVT in the leg as well. The patient reports that her physician saw her today and sent her to the ER because he thinks he is getting a cellulitis in the leg. Patient reports swelling, redness, increased pain in the leg from the knee down. She has had mild shortness of breath intermittently, no chest pain. There has not been any fever.   Past Medical History  Diagnosis Date  . 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 Cancer     pt states only skin cancer  . Numbness of legs     started after fall yesterday 07/03/11  . Bruise     bruise on left foot from fall 07/03/11  . Knee pain, acute 07/04/11    Pt states left knee twisted under her in fall 07/03/11 and now painful  . Rash and nonspecific skin eruption 07/04/11    rash on bilateral upper legs  . Syncope   . Fall   . HTN (hypertension)   . A-fib    Past Surgical History  Procedure Laterality Date  . Elbow surgery      right  . Appendectomy    . Exploratory laparotomy  07/04/11    years ago to find cause of bleeding   No family history on file. History  Substance Use Topics  . Smoking status: Never Smoker   . Smokeless tobacco: Never Used  . Alcohol Use: 1.2 oz/week    2 Cans of beer per week     Comment: 2 cans beer per day   OB History   Grav Para Term Preterm Abortions TAB SAB Ect Mult Living                 Review of Systems  Constitutional: Negative for fever.  Respiratory: Positive for shortness of  breath.   Cardiovascular: Negative for chest pain.  Skin: Positive for color change and wound.  All other systems reviewed and are negative.     Allergies  Rocephin; Clarithromycin; Cod; Codeine; Darvocet; Demadex; Diovan hct; Eggs or egg-derived products; Ex-lax; Ibuprofen; Indanediones; Indapamide; Morphine and related; Sulfa antibiotics; Benzonatate; Penicillins; and Septra  Home Medications   Prior to Admission medications   Medication Sig Start Date End Date Taking? Authorizing Provider  calcium-vitamin D (OSCAL WITH D) 500-200 MG-UNIT per tablet Take 2 tablets by mouth daily.    Historical Provider, MD  cholecalciferol (VITAMIN D) 1000 UNITS tablet Take 1,000 Units by mouth daily.     Historical Provider, MD  doxycycline (VIBRAMYCIN) 100 MG capsule Take 100 mg by mouth 2 (two) times daily.    Historical Provider, MD  loteprednol (LOTEMAX) 0.5 % ophthalmic suspension Place 1 drop into both eyes daily as needed (itching).    Historical Provider, MD  metoprolol tartrate (LOPRESSOR) 12.5 mg TABS tablet Take 0.5 tablets (12.5 mg total) by mouth 2 (two) times daily. 08/29/13   Kinnie Feil, MD  Polyethyl Glycol-Propyl Glycol (SYSTANE OP) Place 1 drop into both  eyes daily.    Historical Provider, MD  predniSONE (DELTASONE) 10 MG tablet Take 10 mg by mouth daily with breakfast. 60,60,60,50,40,20,10    Historical Provider, MD  PROAIR HFA 108 (90 BASE) MCG/ACT inhaler  12/13/13   Historical Provider, MD  telmisartan (MICARDIS) 80 MG tablet Take 80 mg by mouth daily.      Historical Provider, MD   BP 181/54  Pulse 54  Temp(Src) 98.5 F (36.9 C) (Oral)  Resp 15  Ht 5\' 2"  (1.575 m)  Wt 138 lb (62.596 kg)  BMI 25.23 kg/m2  SpO2 100% Physical Exam  Constitutional: She is oriented to person, place, and time. She appears well-developed and well-nourished. No distress.  HENT:  Head: Normocephalic and atraumatic.  Right Ear: Hearing normal.  Left Ear: Hearing normal.  Nose: Nose normal.    Mouth/Throat: Oropharynx is clear and moist and mucous membranes are normal.  Eyes: Conjunctivae and EOM are normal. Pupils are equal, round, and reactive to light.  Neck: Normal range of motion. Neck supple.  Cardiovascular: Regular rhythm, S1 normal and S2 normal.  Exam reveals no gallop and no friction rub.   No murmur heard. Pulses:      Dorsalis pedis pulses are 1+ on the right side, and 1+ on the left side.  Pulmonary/Chest: Effort normal and breath sounds normal. No respiratory distress. She exhibits no tenderness.  Abdominal: Soft. Normal appearance and bowel sounds are normal. There is no hepatosplenomegaly. There is no tenderness. There is no rebound, no guarding, no tenderness at McBurney's point and negative Murphy's sign. No hernia.  Musculoskeletal: Normal range of motion. She exhibits edema (bilateral lower extremities, left much more swollen than the right).  Neurological: She is alert and oriented to person, place, and time. She has normal strength. No cranial nerve deficit or sensory deficit. Coordination normal. GCS eye subscore is 4. GCS verbal subscore is 5. GCS motor subscore is 6.  Skin: Skin is warm, dry and intact. No rash noted. No cyanosis.     Psychiatric: She has a normal mood and affect. Her speech is normal and behavior is normal. Thought content normal.    ED Course  Procedures (including critical care time) Labs Review Labs Reviewed  CBC WITH DIFFERENTIAL - Abnormal; Notable for the following:    RBC 3.75 (*)    Hemoglobin 10.1 (*)    HCT 31.8 (*)    Monocytes Relative 13 (*)    All other components within normal limits  COMPREHENSIVE METABOLIC PANEL - Abnormal; Notable for the following:    Sodium 132 (*)    Chloride 94 (*)    Glucose, Bld 128 (*)    Albumin 3.2 (*)    GFR calc non Af Amer 56 (*)    GFR calc Af Amer 65 (*)    All other components within normal limits  CULTURE, BLOOD (ROUTINE X 2)  CULTURE, BLOOD (ROUTINE X 2)  PRO B NATRIURETIC  PEPTIDE  URINALYSIS, ROUTINE W REFLEX MICROSCOPIC  I-STAT CG4 LACTIC ACID, ED    Imaging Review Dg Chest 2 View  12/28/2013   CLINICAL DATA:  Shortness of breath.  EXAM: CHEST - 2 VIEW  COMPARISON:  07/04/2011  FINDINGS: The heart size and mediastinal contours are within normal limits. There is no evidence of pulmonary edema, consolidation, pneumothorax or nodule. There is a small amount of left pleural fluid. The visualized skeletal structures are unremarkable.  IMPRESSION: Small left pleural effusion.   Electronically Signed   By: Eulas Post  Kathlene Cote M.D.   On: 12/28/2013 13:00     EKG Interpretation   Date/Time:  Wednesday Dec 28 2013 12:03:08 EDT Ventricular Rate:  58 PR Interval:  122 QRS Duration: 89 QT Interval:  446 QTC Calculation: 438 R Axis:   52 Text Interpretation:  Sinus rhythm Normal ECG Confirmed by Johanan Skorupski  MD,  Rashee Marschall ([id number]) on 12/28/2013 12:25:01 PM      MDM   1. Left lower leg cellulitis 2. Left leg DVT  Patient sent to the ER for evaluation of left leg pain, redness and swelling. Patient has a history of DVT in the leg in January. She was on anticoagulation, has been stopped for approximately one month. Patient reports increasing pain and swelling over the last week or more. She does have pain, swelling, warmth in the leg. There is a significant amount of edema compared to the other leg. Pulses are audible with Doppler.  Patient is afebrile and does not have leukocytosis. Both of these might be absent with infection at her each time however. It is difficult to rule out cellulitis based on her exam, but ultrasound does show extensive clot as well. She did not have repeat imaging after the initial DVT was diagnosed. It's not clear if this is chronic or repeat clot.  Based on this, we will ask internal medicine to evaluate the patient for admission for antibiotic therapy, possible anticoagulation.  Addendum: Patient was hypertensive on arrival, blood pressure has  continued to creep up while here in the ER. She was treated with labetalol. She had been ordered 10 mg, but halfway through the dose, recycled blood pressure showed significant improvement.  Orpah Greek, MD 12/28/13 [place], MD 12/28/13 [phone]

## 2013-12-28 NOTE — ED Notes (Signed)
Dr. Pollina at bedside   

## 2013-12-28 NOTE — H&P (Signed)
Date: 12/29/2013               Patient Name:  Shirley Strickland MRN: [id number]  DOB: Mar 15, 1916 Age / Sex: 80 y.o., female   PCP: Juanda Bond Altheimer, MD           Medical Service: Internal Medicine Teaching Service         Attending Physician: Dr. Bartholomew Crews, MD    First Contact: Dr. Michail Jewels, MD Pager: [phone] (7AM-5PM Mon-Fri)  Second Contact: Dr. Karlyn Agee, MD Pager: [phone]       After Hours (After 5p/  First Contact Pager: [phone]  weekends / holidays): Second Contact Pager: [phone]    Most Recent Discharge Date:  09/30/13  Chief Complaint:  Chief Complaint  Patient presents with  . Leg Swelling       History of Present Illness:  Shirley Strickland is a 80 y.o. female who has a past medical history of cellulitis, LE DVT, HTN, chronic diastolic HF, osteopenia, A-fib (not on ASA or coumadin), left-metatarsal fracture, and PNA.  Pt presents to the ED with complaints of left LE swelling, erythema, and pain that has gradually gotten worse after over the past couple of days.  She has chronic venous stasis.  She reports having increased pain and swelling in the left leg to the point that it was difficult for her to walk.  She has had multiple surgeries on the leg by plastic surgery for skin loss.  She has tried to wear compression stockings in the past but is not able to.  She does however elevate her legs with a "wedge" that she has on her bed.  She was told that she had "diseased legs."  Today, she had a new [facility] physician that had nevercome out to her house and thought that her leg looked infected and needed antibiotics.  She denies any HA, fever/chills, N/V/D/C, abdominal pain, dysuria, or focal weakness.  She normally ambulates with a walker and was discharged from [facility] on April 21 after a 3 month stay after 2/5 discharge for cellulitis/DVT.  She lives alone, and since that time, she has had home health services.  She needs helps with ADLs including bathing and cooking.   She has a niece that cooks meals for her and checks on her daily.    Of note, pt discharged on 2/5 with left lower extremity cellulitis and DVT (common femoral vein, left femoral vein, posterior tibial vein).  Initially on ceftriaxone and changed to doxycycline; completed 7 day course.  She was started on anticoagulation for DVT which led to hgb drop from 9.5-->7.7 and was given 1 unit of prbcs with hgb back up to 9.6.  Hemoccult negative.  IR placed an IVC filter on 09/27/13.    She is followed by Dr. Iran Planas, plastic and reconstructive surgery, who will see the patient while she is here.     Meds: Current Facility-Administered Medications  Medication Dose Route Frequency Provider Last Rate Last Dose  . acetaminophen (TYLENOL) tablet 650 mg  650 mg Oral Q4H PRN Cresenciano Genre, MD      . albuterol (PROVENTIL) (2.5 MG/3ML) 0.083% nebulizer solution 3 mL  3 mL Inhalation BID PRN Cresenciano Genre, MD      . amLODipine (NORVASC) tablet 5 mg  5 mg Oral Daily Cresenciano Genre, MD   5 mg at 12/28/13 1711  . calcium-vitamin D (OSCAL WITH D) 500-200 MG-UNIT per tablet 2 tablet  2 tablet  Oral Q breakfast Cresenciano Genre, MD      . cholecalciferol (VITAMIN D) tablet 1,000 Units  1,000 Units Oral Daily Cresenciano Genre, MD      . doxycycline (VIBRA-TABS) tablet 100 mg  100 mg Oral Q12H Cresenciano Genre, MD   100 mg at 12/28/13 2255  . enoxaparin (LOVENOX) injection 40 mg  40 mg Subcutaneous Q24H Cresenciano Genre, MD   40 mg at 12/28/13 2256  . loteprednol (LOTEMAX) 0.5 % ophthalmic suspension 1 drop  1 drop Both Eyes Daily PRN Cresenciano Genre, MD      . metoprolol tartrate (LOPRESSOR) tablet 12.5 mg  12.5 mg Oral BID Cresenciano Genre, MD   12.5 mg at 12/28/13 2256  . ondansetron (ZOFRAN) injection 4 mg  4 mg Intravenous Q6H PRN Cresenciano Genre, MD      . sodium chloride 0.9 % injection 3 mL  3 mL Intravenous Q12H Cresenciano Genre, MD      . spiritus frumenti (ethyl alcohol) solution 1 each  1 each Oral Daily Cresenciano Genre,  MD        Allergies: Allergies as of 12/28/2013 - Review Complete 12/28/2013  Allergen Reaction Noted  . Rocephin [ceftriaxone sodium in dextrose]  11/12/2013  . Clarithromycin Nausea Only 07/04/2011  . [place] [fish allergy] Other (See Comments) 07/04/2011  . Codeine Nausea And Vomiting 07/04/2011  . Darvocet [propoxyphene n-acetaminophen] Nausea And Vomiting 07/04/2011  . Demadex [torsemide] Hives and Nausea And Vomiting 07/04/2011  . Diovan hct [valsartan-hydrochlorothiazide] Other (See Comments) 07/04/2011  . Eggs or egg-derived products Nausea And Vomiting 08/23/2013  . Ex-lax [senna] Other (See Comments) 07/04/2011  . Ibuprofen Nausea Only 07/04/2011  . Indanediones Other (See Comments) 07/04/2011  . Indapamide Other (See Comments) 07/04/2011  . Morphine and related Nausea And Vomiting 07/04/2011  . Sulfa antibiotics Other (See Comments) 08/23/2013  . Benzonatate Itching and Rash 07/04/2011  . Penicillins Rash 07/04/2011  . Septra [bactrim] Rash 07/04/2011    PMH: Past Medical History  Diagnosis Date  . 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 Cancer     pt states only skin cancer  . Numbness of legs     started after fall yesterday 07/03/11  . Bruise     bruise on left foot from fall 07/03/11  . Knee pain, acute 07/04/11    Pt states left knee twisted under her in fall 07/03/11 and now painful  . Rash and nonspecific skin eruption 07/04/11    rash on bilateral upper legs  . Syncope   . Fall   . HTN (hypertension)   . A-fib   . Pneumonia   . Osteopenia   . DVT (deep venous thrombosis) 12/28/2013    LEFT   . CHF (congestive heart failure)   . GERD (gastroesophageal reflux disease)     PSH: Past Surgical History  Procedure Laterality Date  . Elbow surgery      right  . Appendectomy    . Exploratory laparotomy  07/04/11    years ago to find cause of bleeding  . Cataract extraction      FH: History reviewed. No pertinent family history.  SH: History    Substance Use Topics  . Smoking status: Never Smoker   . Smokeless tobacco: Never Used  . Alcohol Use: 1.2 oz/week    2 Cans of beer per week     Comment: 2 cans beer per day   12/28/2013  DRINKS ONLY  1 CAN A DAY    Review of Systems: Pertinent items are noted in HPI.  Physical Exam: Filed Vitals:   12/28/13 2121  BP: 153/45  Pulse: 68  Temp: 98.2 F (36.8 C)  Resp: 20    Physical Exam Constitutional: Vital signs reviewed.  Patient is a well-developed, well-nourished female in no acute distress and cooperative with exam. She appears younger than her stated age.    Head: Normocephalic and atraumatic Eyes: PERRL, EOMI, conjunctivae normal, No scleral icterus.  Neck: Supple, Trachea midline  Cardiovascular: RRR, no MRG Pulmonary/Chest: normal respiratory effort, CTAB, no wheezes, rales, or rhonchi Abdominal: Soft. Non-tender, non-distended, bowel sounds are normal, no masses, organomegaly, or guarding present Musculoskeletal: No joint deformities, erythema, or stiffness Neurological: A&O x3, cranial nerve II-XII are grossly intact, no focal motor deficit Skin: Warm, dry and intact.  She has erythema, exquisite tenderness, and swelling of both LE L>R.  She has chronic venous statis dermatitis b/l.  She has ~2cm healing wound on her left leg that does not appear infected-it is without drainage.   Psychiatric: Normal mood and affect.   Lab results:  Basic Metabolic Panel:  Recent Labs  12/28/13 1143  NA 132*  K 4.5  CL 94*  CO2 26  GLUCOSE 128*  BUN 11  CREATININE 0.85  CALCIUM 10.0   Anion Gap: 12  Calcium/Magnesium/Phosphorus:  Recent Labs Lab 12/28/13 1143  CALCIUM 10.0    Liver Function Tests:  Recent Labs  12/28/13 1143  AST 15  ALT 11  ALKPHOS 77  BILITOT 0.3  PROT 6.5  ALBUMIN 3.2*   No results found for this basename: LIPASE, AMYLASE,  in the last 72 hours No results found for this basename: AMMONIA,  in the last 72 hours  CBC: Lab  Results  Component Value Date   WBC 5.6 12/28/2013   HGB 10.1* 12/28/2013   HCT 31.8* 12/28/2013   MCV 84.8 12/28/2013   PLT 226 12/28/2013    Lipase: No results found for this basename: LIPASE    Lactic Acid/Procalcitonin:  Recent Labs Lab 12/28/13 1153  LATICACIDVEN 1.42    Cardiac Enzymes: No results found for this basename: TROPIPOC,  in the last 72 hours Lab Results  Component Value Date   CKTOTAL 123 07/05/2011   CKMB 4.0 07/05/2011   TROPONINI <0.30 07/05/2011    BNP:  Recent Labs  12/28/13 1146  PROBNP 53.4    D-Dimer: No results found for this basename: DDIMER,  in the last 72 hours  CBG: No results found for this basename: GLUCAP,  in the last 72 hours  Hemoglobin A1C: No results found for this basename: HGBA1C,  in the last 72 hours  Lipid Panel: No results found for this basename: CHOL, HDL, LDLCALC, TRIG, CHOLHDL, LDLDIRECT,  in the last 72 hours  Thyroid Function Tests: No results found for this basename: TSH, T4TOTAL, FREET4, T3FREE, THYROIDAB,  in the last 72 hours  Anemia Panel: No results found for this basename: VITAMINB12, FOLATE, FERRITIN, TIBC, IRON, RETICCTPCT,  in the last 72 hours  Coagulation: No results found for this basename: LABPROT, INR,  in the last 72 hours  Urine Drug Screen: Drugs of Abuse:  No results found for this basename: labopia,  cocainscrnur,  labbenz,  amphetmu,  thcu,  labbarb    Alcohol Level: No results found for this basename: ETH,  in the last 72 hours  Urinalysis:   Recent Labs  12/28/13 1326  COLORURINE YELLOW  LABSPEC 1.009  PHURINE 7.0  GLUCOSEU NEGATIVE  HGBUR NEGATIVE  BILIRUBINUR NEGATIVE  KETONESUR NEGATIVE  PROTEINUR NEGATIVE  UROBILINOGEN 0.2  NITRITE NEGATIVE  LEUKOCYTESUR SMALL*    Imaging results:  Dg Chest 2 View  12/28/2013   CLINICAL DATA:  Shortness of breath.  EXAM: CHEST - 2 VIEW  COMPARISON:  07/04/2011  FINDINGS: The heart size and mediastinal contours are within normal  limits. There is no evidence of pulmonary edema, consolidation, pneumothorax or nodule. There is a small amount of left pleural fluid. The visualized skeletal structures are unremarkable.  IMPRESSION: Small left pleural effusion.   Electronically Signed   By: Aletta Edouard M.D.   On: 12/28/2013 13:00    EKG: EKG Interpretation  Date/Time:  Wednesday Dec 28 2013 12:03:08 EDT Ventricular Rate:  58 PR Interval:  122 QRS Duration: 89 QT Interval:  446 QTC Calculation: 438 R Axis:   52 Text Interpretation:  Sinus rhythm Normal ECG Confirmed by POLLINA  MD, CHRISTOPHER [phone]) on 12/28/2013 12:25:01 PM   Orders placed during the hospital encounter of 12/28/13  . ED EKG  . ED EKG  . EKG 12-LEAD  . EKG 12-LEAD     Antibiotics: Antibiotics Given (last 72 hours)   Date/Time Action Medication Dose   12/28/13 2255 Given   doxycycline (VIBRA-TABS) tablet 100 mg 100 mg      Anti-infectives   Start     Dose/Rate Route Frequency Ordered Stop   12/28/13 2200  doxycycline (VIBRA-TABS) tablet 100 mg     100 mg Oral Every 12 hours 12/28/13 1935        SIRS/Sepsis/Septic Shock criteria met: No   Consults:    Assessment & Plan by Problem: Principal Problem:   DVT (deep venous thrombosis)-left  #LE DVT complicated by cellulitis -  Pt has a h/o DVT.  Pt with a left LE wound that appears to be healing without drainage.  There is warmth, pain, and erythema of the entire left LE to the knee.  Skin is very thin.    -consulted vascular surgery -consulted plastic surgery -bilateral arterial dopplers  -doxycycline -neurovascular checks  #Diastolic dysfunction (grade 1) Stable.  Pt does not appear volume overloaded.  Echo 07/05/11 revealed LVEF of 60-65% with grade 1 diastolic dysfunction.  12/28/13 BNP: 53.4.   -monitor   #Chronic Kidney Disease -   No complications including volume overload, metabolic acidosis, hyperkalemia, hyperphosphatemia, hypocalcemia. Lab Results  Component Value  Date   CREATININE 0.85 12/28/2013   -BMP to monitor electolytes -avoid nephrotoxic agents  #Hypertension -  Pt initially normotensive on arrival to ED, but BP increased to 214/69.    -continue home meds   #H/o Atrial Fibrillation -  Pt currently rate controlled.  EKG shows NSR. Pt CHADS2: 3 and is not able to tolerate coumadin.   Last INR: Lab Results  Component Value Date   INR 1.07 09/30/2013   INR 1.02 09/29/2013   INR 1.17 09/28/2013  -telemetry -goal HR<110 -continue home meds -consider xarelto  #Anemia -  H/H stable; pt has no symptoms/signs of bleeding.  -monitor for bleeding -monitor cbc -type and screen   #H/o falls - -PT/OT  #FEN-  Fluids- Electrolytes-  Hyponatremia - SIADH, TSH, CHF, Cirrhosis, Nephrotic - (if sodium and chloride elevated d/c NS iatrogenic)--thiazide diuretics, diarrhea (GI losses), pancreatitis (3rd spacing) Nutrition- DIet [place] [employer]; beer x//day  #VTE prophylaxis -  lovenox $Remove'40mg'[id number]$  SQ qd  #Dispo - Disposition deferred at this time, awaiting improvement of current medical problems.  Anticipated discharge in approximately 1-2 day(s).    Emergency Contact: Contact Information   Name Relation Home Work [place] Other [place] Niece [phone]     Nance,Debbie Niece   [phone]   Ruben Reason   [phone]      The patient does have a current PCP Juanda Bond Altheimer, MD) and does need an [facility] hospital follow-up appointment after discharge.  Signed: Jones Bales, MD PGY-1, Internal Medicine Teaching Service [phone] (7AM-5PM Mon-Fri) 12/29/2013, 4:45 AM

## 2013-12-28 NOTE — Progress Notes (Signed)
Agree with MS note see Dr. Charlyne Petrin note for more details.  In addition added Doxycycline 100 mg bid and Zofran for nausea due to Doxycycline.    Aundra Dubin MD

## 2013-12-28 NOTE — ED Notes (Signed)
NOTIFIED C.LAWYER ,PA OF PATIENTS LAB RESULTS OF CG4+ LACTIC ACID ,12:08 PM ,12/28/2013.

## 2013-12-28 NOTE — Progress Notes (Signed)
Date: 12/28/2013               Patient Name:  Shirley Strickland MRN: [id number]  DOB: 1916-04-23 Age / Sex: 80 y.o., female   PCP: Juanda Bond Altheimer, MD              Medical Service: Internal Medicine Teaching Service              Attending Physician: Dr. Bartholomew Crews, MD    First Contact:  [place] Pager: [phone]  Second Contact: Dr. Aundra Dubin Pager: [phone]  Third Contact Dr. Gordy Levan Pager: [phone]       After Hours (After 5p/  First Contact Pager: [phone]  weekends / holidays): Second Contact Pager: [phone]   Chief Complaint: Left leg swelling  History of Present Illness:80 yo F c/o left leg swelling, accompanied with redness, and tenderness.H/o DVT x3 since December2014.after a fall and fractured left leg.  Unable to tolerate anticoagulation, had IVC filter in Febraury. C/O associated pain, warmth and redness started yesterday, progressing since then. Unable to walk since this morning. H/O skin grafting done on left leg. Denies any SOB, chest pain, nausea, vomiting. Change in wt. Or appetite.No h/o change in  urinary or bowel habits.  Meds: Current Facility-Administered Medications  Medication Dose Route Frequency Provider Last Rate Last Dose  . acetaminophen (TYLENOL) tablet 650 mg  650 mg Oral Q4H PRN Cresenciano Genre, MD      . albuterol (PROVENTIL) (2.5 MG/3ML) 0.083% nebulizer solution 3 mL  3 mL Inhalation BID PRN Cresenciano Genre, MD      . amLODipine (NORVASC) tablet 5 mg  5 mg Oral Daily Cresenciano Genre, MD   5 mg at 12/28/13 1711  . [START ON 12/29/2013] calcium-vitamin D (OSCAL WITH D) 500-200 MG-UNIT per tablet 2 tablet  2 tablet Oral Q breakfast Cresenciano Genre, MD      . Derrill Memo ON 12/29/2013] cholecalciferol (VITAMIN D) tablet 1,000 Units  1,000 Units Oral Daily Cresenciano Genre, MD      . enoxaparin (LOVENOX) injection 40 mg  40 mg Subcutaneous Q24H Cresenciano Genre, MD      . loteprednol (LOTEMAX) 0.5 % ophthalmic suspension 1 drop  1 drop Both Eyes Daily PRN Cresenciano Genre, MD      . metoprolol tartrate (LOPRESSOR) tablet 12.5 mg  12.5 mg Oral BID Cresenciano Genre, MD      . sodium chloride 0.9 % injection 3 mL  3 mL Intravenous Q12H Cresenciano Genre, MD      . spiritus frumenti (ethyl alcohol) solution 1 each  1 each Oral Daily Cresenciano Genre, MD        Allergies: Allergies as of 12/28/2013 - Review Complete 12/28/2013  Allergen Reaction Noted  . Rocephin [ceftriaxone sodium in dextrose]  11/12/2013  . Clarithromycin Nausea Only 07/04/2011  . [place] [fish allergy] Other (See Comments) 07/04/2011  . Codeine Nausea And Vomiting 07/04/2011  . Darvocet [propoxyphene n-acetaminophen] Nausea And Vomiting 07/04/2011  . Demadex [torsemide] Hives and Nausea And Vomiting 07/04/2011  . Diovan hct [valsartan-hydrochlorothiazide] Other (See Comments) 07/04/2011  . Eggs or egg-derived products Nausea And Vomiting 08/23/2013  . Ex-lax [senna] Other (See Comments) 07/04/2011  . Ibuprofen Nausea Only 07/04/2011  . Indanediones Other (See Comments) 07/04/2011  . Indapamide Other (See Comments) 07/04/2011  . Morphine and related Nausea And Vomiting 07/04/2011  . Sulfa antibiotics Other (See Comments) 08/23/2013  . Benzonatate Itching and Rash 07/04/2011  .  Penicillins Rash 07/04/2011  . Septra [bactrim] Rash 07/04/2011   Past Medical History  Diagnosis Date  . 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 Cancer     pt states only skin cancer  . Numbness of legs     started after fall yesterday 07/03/11  . Bruise     bruise on left foot from fall 07/03/11  . Knee pain, acute 07/04/11    Pt states left knee twisted under her in fall 07/03/11 and now painful  . Rash and nonspecific skin eruption 07/04/11    rash on bilateral upper legs  . Syncope   . Fall   . HTN (hypertension)   . A-fib   . Pneumonia   . Osteopenia    Past Surgical History  Procedure Laterality Date  . Elbow surgery      right  . Appendectomy    . Exploratory laparotomy  07/04/11    years ago  to find cause of bleeding   No family history on file. History   Social History  . Marital Status: Widowed    Spouse Name: N/A    Number of Children: N/A  . Years of Education: N/A   Occupational History  . Not on file.   Social History Main Topics  . Smoking status: Never Smoker   . Smokeless tobacco: Never Used  . Alcohol Use: 1.2 oz/week    2 Cans of beer per week     Comment: 2 cans beer per day  . Drug Use: No  . Sexual Activity: No   Other Topics Concern  . Not on file   Social History Narrative  . No narrative on file    Review of Systems: Pertinent items are noted in HPI.  Physical Exam: Blood pressure 188/55, pulse 63, temperature 97.9 F (36.6 C), temperature source Oral, resp. rate 15, height 5\' 2"  (1.575 m), weight 62.59 kg (137 lb 15.8 oz), SpO2 98.00%. BP 188/55  Pulse 63  Temp(Src) 97.9 F (36.6 C) (Oral)  Resp 15  Ht 5\' 2"  (1.575 m)  Wt 62.59 kg (137 lb 15.8 oz)  BMI 25.23 kg/m2  SpO2 98%  General Appearance:    Alert, cooperative, no distress, appears stated age  Head:    Normocephalic, without obvious abnormality, atraumatic  Nose:   Nares normal, septum midline, mucosa normal, no drainage    or sinus tenderness  Throat:   Lips, mucosa, and tongue normal; teeth and gums normal  Neck:   Supple, symmetrical, trachea midline, no adenopathy;    thyroid:  no enlargement/tenderness/nodules; no carotid   bruit or JVD  Back:     Symmetric, no curvature, ROM normal, no CVA tenderness  Lungs:     Clear to auscultation bilaterally, respirations unlabored  Chest Wall:    No tenderness or deformity   Heart:    Regular rate and rhythm, S1 and S2 normal, no murmur, rub   or gallop     Abdomen:     Soft, non-tender, bowel sounds active all four quadrants,    no masses, no organomegaly        Extremities:   Left leg swollen, tender and warm mostly on posterior thigh and leg upto foot.Marland Kitchen Healing wound on anterior left leg with erythema, some discoloration  and swelling.  Pulses:   2+ at right lower limb. Unable to get pulses at left lower limb due to swelling.  Skin:   Skin , texture, turgor normal, Had mulitple small bruises  on both upper and lower limbs.  Lymph nodes:   Cervical, supraclavicular, and axillary nodes normal  Neurologic:   CNII-XII intact, normal strength, sensation and reflexes    throughout    Lab results: @LABTEST @  Imaging results:  Dg Chest 2 View  12/28/2013   CLINICAL DATA:  Shortness of breath.  EXAM: CHEST - 2 VIEW  COMPARISON:  07/04/2011  FINDINGS: The heart size and mediastinal contours are within normal limits. There is no evidence of pulmonary edema, consolidation, pneumothorax or nodule. There is a small amount of left pleural fluid. The visualized skeletal structures are unremarkable.  IMPRESSION: Small left pleural effusion.   Electronically Signed   By: Aletta Edouard M.D.   On: 12/28/2013 13:00    Other results: EKG: normal EKG, normal sinus rhythm, unchanged from previous tracings.  Assessment & Plan by Problem: Active Problems:   DVT (deep venous thrombosis) Vascular surgeon consult due to her anticoagulant intolerence. Plastic surgeon consult for her wound at anterolateral left leg. Get arterial doppler to rule out peripheral vascular disease. Consider xarelto  This is a Careers information officer Note.  The care of the patient was discussed with Dr.  and the assessment and plan was formulated with their assistance.  Please see their note for official documentation of the patient encounter.   Signed: Lorella Nimrod, Med Student 12/28/2013, 5:49 PM

## 2013-12-29 DIAGNOSIS — L03119 Cellulitis of unspecified part of limb: Secondary | ICD-10-CM

## 2013-12-29 DIAGNOSIS — L02419 Cutaneous abscess of limb, unspecified: Secondary | ICD-10-CM

## 2013-12-29 DIAGNOSIS — I129 Hypertensive chronic kidney disease with stage 1 through stage 4 chronic kidney disease, or unspecified chronic kidney disease: Secondary | ICD-10-CM

## 2013-12-29 DIAGNOSIS — I824Y9 Acute embolism and thrombosis of unspecified deep veins of unspecified proximal lower extremity: Principal | ICD-10-CM

## 2013-12-29 DIAGNOSIS — D649 Anemia, unspecified: Secondary | ICD-10-CM

## 2013-12-29 DIAGNOSIS — I509 Heart failure, unspecified: Secondary | ICD-10-CM

## 2013-12-29 DIAGNOSIS — I5030 Unspecified diastolic (congestive) heart failure: Secondary | ICD-10-CM

## 2013-12-29 DIAGNOSIS — N189 Chronic kidney disease, unspecified: Secondary | ICD-10-CM

## 2013-12-29 LAB — BASIC METABOLIC PANEL
BUN: 12 mg/dL (ref 6–23)
CO2: 26 mEq/L (ref 19–32)
CREATININE: 0.92 mg/dL (ref 0.50–1.10)
Calcium: 9.4 mg/dL (ref 8.4–10.5)
Chloride: 98 mEq/L (ref 96–112)
GFR calc Af Amer: 59 mL/min — ABNORMAL LOW (ref >90)
GFR, EST NON AFRICAN AMERICAN: 51 mL/min — AB (ref >90)
Glucose, Bld: 93 mg/dL (ref 70–99)
Potassium: 4.9 mEq/L (ref 3.7–5.3)
Sodium: 133 mEq/L — ABNORMAL LOW (ref 137–147)

## 2013-12-29 MED ORDER — TELMISARTAN 80 MG PO TABS
80.0000 mg | ORAL_TABLET | Freq: Every day | ORAL | Status: DC
  Administered 2013-12-29 – 2013-12-30 (×2): 80 mg via ORAL

## 2013-12-29 NOTE — Progress Notes (Signed)
Subjective:Patient is feeling better today, sitting in chair, eating and drinking well. Her leg pain and swelling got better. Still c/o discomfort while walking but much better then yesterday. Objective: Vital signs in last 24 hours: Filed Vitals:   12/28/13 1500 12/28/13 1608 12/28/13 2121 12/29/13 0510  BP: 179/51 188/55 153/45 171/61  Pulse: 65 63 68 62  Temp:  97.9 F (36.6 C) 98.2 F (36.8 C) 98 F (36.7 C)  TempSrc:  Oral Oral Oral  Resp: 19 15 20 20   Height:  5\' 2"  (1.575 m)    Weight:  62.59 kg (137 lb 15.8 oz)  66.407 kg (146 lb 6.4 oz)  SpO2: 96% 98% 94% 97%   Weight change:   Intake/Output Summary (Last 24 hours) at 12/29/13 1128 Last data filed at 12/29/13 0947  Gross per 24 hour  Intake    240 ml  Output    100 ml  Net    140 ml   General appearance: alert, cooperative, appears stated age and no distress Chest: clear, no added sound CVS: s1 and s2 normal. No mrg.  ABD: soft, non tender,BS +  EXT: both lower ext. Have multiple bruises, left still have some oedema but much improved then yesterday. Healing wound on left anterolateral leg. Mild erythema, warmth and swelling extending to thigh mostly on posterior side.DP 2+ B/L Lab Results: @LABTEST2 @ Micro Results: Recent Results (from the past 240 hour(s))  CULTURE, BLOOD (ROUTINE X 2)     Status: None   Collection Time    12/28/13 11:43 AM      Result Value Ref Range Status   Specimen Description BLOOD RIGHT ANTECUBITAL   Final   Special Requests BOTTLES DRAWN AEROBIC AND ANAEROBIC 5CC   Final   Culture  Setup Time     Final   Value: 12/28/2013 16:26     Performed at [facility]   Culture     Final   Value:        BLOOD CULTURE RECEIVED NO GROWTH TO DATE CULTURE WILL BE HELD FOR 5 DAYS BEFORE ISSUING A FINAL NEGATIVE REPORT     Performed at [facility]   Report Status PENDING   Incomplete  CULTURE, BLOOD (ROUTINE X 2)     Status: None   Collection Time    12/28/13 11:56 AM      Result  Value Ref Range Status   Specimen Description BLOOD RIGHT FOREARM   Final   Special Requests BOTTLES DRAWN AEROBIC ONLY 5MLS   Final   Culture  Setup Time     Final   Value: 12/28/2013 16:26     Performed at [facility]   Culture     Final   Value:        BLOOD CULTURE RECEIVED NO GROWTH TO DATE CULTURE WILL BE HELD FOR 5 DAYS BEFORE ISSUING A FINAL NEGATIVE REPORT     Performed at [facility]   Report Status PENDING   Incomplete   Studies/Results: Dg Chest 2 View  12/28/2013   CLINICAL DATA:  Shortness of breath.  EXAM: CHEST - 2 VIEW  COMPARISON:  07/04/2011  FINDINGS: The heart size and mediastinal contours are within normal limits. There is no evidence of pulmonary edema, consolidation, pneumothorax or nodule. There is a small amount of left pleural fluid. The visualized skeletal structures are unremarkable.  IMPRESSION: Small left pleural effusion.   Electronically Signed   By: Aletta Edouard M.D.   On: 12/28/2013  13:00   Medications: I have reviewed the patient's current medications. Scheduled Meds: . amLODipine  5 mg Oral Daily  . calcium-vitamin D  2 tablet Oral Q breakfast  . cholecalciferol  1,000 Units Oral Daily  . doxycycline  100 mg Oral Q12H  . enoxaparin (LOVENOX) injection  40 mg Subcutaneous Q24H  . metoprolol tartrate  12.5 mg Oral BID  . sodium chloride  3 mL Intravenous Q12H  . spiritus frumenti  1 each Oral Daily  . telmisartan  80 mg Oral Daily   Continuous Infusions:  PRN Meds:.acetaminophen, albuterol, loteprednol, ondansetron (ZOFRAN) IV Assessment/Plan: Principal Problem:   DVT (deep venous thrombosis)-left Active Problems:   A-fib   Cellulitis  DVT: erythema and swelling improved, keep her legs elevated .  Cellulitis: Improving with doxycyclin, continue with that. Try to ambulate the patient, get pt consult. This is a Careers information officer Note.  The care of the patient was discussed with Dr. Aundra Dubin and the assessment and plan formulated  with their assistance.  Please see their attached note for official documentation of the daily encounter.   LOS: 1 day   Lorella Nimrod, Med Student 12/29/2013, 11:28 AM

## 2013-12-29 NOTE — Evaluation (Signed)
Physical Therapy Evaluation Patient Details Name: Shirley Strickland MRN: [id number] DOB: May 14, 1916 Today's Date: 12/29/2013   History of Present Illness  Admitted with LLE Cellulitis (wound from fall Dec 30th), DVTs LLE  Clinical Impression  Pt admitted with/for painful and swollen L LE making it difficult to walk.  Pt currently limited functionally due to the problems listed below.  (see problems list.)  Pt will benefit from PT to maximize function and safety to be able to get home safely with available assist of family and services.      Follow Up Recommendations Home health PT;Supervision - Intermittent;Supervision for mobility/OOB (likely SNF benefits have run out--needs max services for hom)    Equipment Recommendations  None recommended by PT    Recommendations for Other Services       Precautions / Restrictions Precautions Precautions: Fall      Mobility  Bed Mobility                  Transfers Overall transfer level: Needs assistance Equipment used: Rolling walker (2 wheeled) Transfers: Sit to/from Stand Sit to Stand: Min assist         General transfer comment: cues for safest technique  Ambulation/Gait Ambulation/Gait assistance: Min assist Ambulation Distance (Feet): 40 Feet Assistive device: Rolling walker (2 wheeled) Gait Pattern/deviations: Step-through pattern;Trunk flexed Gait velocity: slow Gait velocity interpretation: Below normal speed for age/gender General Gait Details: mildly unsteady and guarded.  Takes little steps, turns slowly and tentatively  Financial trader Rankin (Stroke Patients Only)       Balance Overall balance assessment: Needs assistance Sitting-balance support: Feet supported;No upper extremity supported Sitting balance-Leahy Scale: Fair     Standing balance support: During functional activity;Bilateral upper extremity supported Standing balance-Leahy Scale: Poor Standing  balance comment: Shows short moments of standing without holding, but needs the RW                             Pertinent Vitals/Pain     Home Living Family/patient expects to be discharged to:: Private residence Living Arrangements: Alone Available Help at Discharge: Available PRN/intermittently;Family;Personal care attendant (PCA 2 days per week for hour max, niece is 1*) Type of Home: House       Home Layout: One level Home Equipment: Walker - 2 wheels;Other (comment) (lift chair) Additional Comments: D/C'd from SNF recently and struggling  due to L LE swelling and pain as well as weakness    Prior Function Level of Independence: Needs assistance   Gait / Transfers Assistance Needed: Used walker in home-like setting  ADL's / Homemaking Assistance Needed: did the majority of her sponge bathing and dressing.  Niece fixes meals to microwave, runs errands and straightens up.        Hand Dominance        Extremity/Trunk Assessment   Upper Extremity Assessment: Overall WFL for tasks assessed;Generalized weakness           Lower Extremity Assessment: Overall WFL for tasks assessed;Generalized weakness (Left weaker than R LE)         Communication   Communication: HOH  Cognition Arousal/Alertness: Awake/alert Behavior During Therapy: WFL for tasks assessed/performed Overall Cognitive Status: Within Functional Limits for tasks assessed                      General Comments  Exercises        Assessment/Plan    PT Assessment Patient needs continued PT services  PT Diagnosis Generalized weakness;Difficulty walking   PT Problem List Decreased strength;Decreased activity tolerance;Decreased balance;Decreased mobility;Decreased knowledge of use of DME;Decreased skin integrity;Pain  PT Treatment Interventions DME instruction;Gait training;Functional mobility training;Therapeutic activities;Balance training;Patient/family education   PT  Goals (Current goals can be found in the Care Plan section) Acute Rehab PT Goals Patient Stated Goal: I want to get back home PT Goal Formulation: With patient Time For Goal Achievement: 01/05/14 Potential to Achieve Goals: Good    Frequency Min 3X/week   Barriers to discharge Decreased caregiver support      Co-evaluation               End of Session   Activity Tolerance: Patient tolerated treatment well Patient left: in chair;with call bell/phone within reach Nurse Communication: Mobility status         Time: 5003-7048 PT Time Calculation (min): 31 min   Charges:   PT Evaluation $Initial PT Evaluation Tier I: 1 Procedure PT Treatments $Gait Training: 8-22 mins $Therapeutic Activity: 8-22 mins   PT G CodesTessie Fass Brailyn Strickland 12/29/2013, 3:09 PM 12/29/2013  Shirley Strickland, PT [phone]  (pager)

## 2013-12-29 NOTE — Progress Notes (Signed)
OT Cancellation Note  Patient Details Name: Shirley Strickland MRN: [id number] DOB: [birth year]   Cancelled Treatment:    Reason Eval/Treat Not Completed:  Pt currently working with PT.  Will reattempt.  [name] 12/29/2013, 3:04 PM

## 2013-12-29 NOTE — H&P (Signed)
  Date: 12/29/2013  Patient name: Shirley Strickland  Medical record number: [id number]  Date of birth: Jan 04, 1916   I have seen and evaluated Shirley Strickland and discussed their care with the Residency Team. Shirley Strickland was seen by her new PCP on day of admission and sent to ED 2/2 appearance of L leg - red, swollen, warm, and painful. Additionally, pt had not been able to ambulate 2/2 pain. She was admitted and tx for cellulitis.  She has known DVT of L leg (Jan 2015 while at [facility]) but dropped her HgB and required PRBC with anticoag so has been deemed not an anti-coag candidiate. She got a IVC filter.   She has A Fib but not on any anti coag 2/2 bleeding and is not on ASA per her PCP 2/2 "Why rock the boat" were pt's words meaning too high bleeding risk.  She currently lives alone. She had been in SNF for 3 months after her fibular fracture after syncope and fall Dec 2014  Assessment and Plan: I have seen and evaluated the patient as outlined above. I agree with the formulated Assessment and Plan as detailed in the residents' admission note, with the following changes:   1. LE Cellulitis - agree with Doxy and follow clinical response. She is at risk for cellulitis 2/2 edema and DVT.  2. H/O DVT - not anti-coag candidate. Has IVC filter.  3. A Fib - PCP has longer relationship with pt and has deemed her inappropriate for ASA. Will defer to her new PCP.   Likely D/C in AM.  Bartholomew Crews, MD 5/7/20153:57 PM

## 2013-12-29 NOTE — Progress Notes (Addendum)
Subjective:   VSS.  No events overnight.  Pt is sitting up in the chair eating breakfast and is in good spirits.  She has her feet elevated and swelling and erythema has improved.  She denies any complaints except she feels that her socks are too tight.     Objective:   Vital signs in last 24 hours: Filed Vitals:   12/28/13 1500 12/28/13 1608 12/28/13 2121 12/29/13 0510  BP: 179/51 188/55 153/45 171/61  Pulse: 65 63 68 62  Temp:  97.9 F (36.6 C) 98.2 F (36.8 C) 98 F (36.7 C)  TempSrc:  Oral Oral Oral  Resp: 19 15 20 20   Height:  5\' 2"  (1.575 m)    Weight:  137 lb 15.8 oz (62.59 kg)  146 lb 6.4 oz (66.407 kg)  SpO2: 96% 98% 94% 97%   Weight change:   Intake/Output Summary (Last 24 hours) at 12/29/13 1457 Last data filed at 12/29/13 0947  Gross per 24 hour  Intake    240 ml  Output      0 ml  Net    240 ml    Physical Exam: Constitutional: Vital signs reviewed. Patient is a well-developed, well-nourished female in no acute distress and cooperative with exam. She appears younger than her stated age.  Head: Normocephalic and atraumatic  Eyes: PERRL, EOMI, conjunctivae normal, No scleral icterus.  Neck: Supple, Trachea midline  Cardiovascular: RRR, no MRG  Pulmonary/Chest: normal respiratory effort, CTAB, no wheezes, rales, or rhonchi  Abdominal: Soft. Non-tender, non-distended, bowel sounds are normal, no masses, organomegaly, or guarding present  Musculoskeletal: No joint deformities, erythema, or stiffness Neurological: A&O x3, cranial nerve II-XII are grossly intact, no focal motor deficit  Skin: Warm, dry and intact. She has erythema, exquisite tenderness, and swelling of both LE L>R. She has chronic venous statis dermatitis b/l. She has ~2cm healing wound on her left leg that does not appear infected-it is without drainage.  Psychiatric: Normal mood and affect.   Lab Results:  BMP:  Recent Labs Lab 12/28/13 1143 12/29/13 0530  NA 132* 133*  K 4.5 4.9    CL 94* 98  CO2 26 26  GLUCOSE 128* 93  BUN 11 12  CREATININE 0.85 0.92  CALCIUM 10.0 9.4   Anion Gap: 9  CBC:  Recent Labs Lab 12/28/13 1143  WBC 5.6  NEUTROABS 3.4  HGB 10.1*  HCT 31.8*  MCV 84.8  PLT 226    Coagulation: No results found for this basename: LABPROT, INR,  in the last 168 hours  CBG:           No results found for this basename: GLUCAP,  in the last 168 hours         HA1C:      No results found for this basename: HGBA1C,  in the last 168 hours  Lipid Panel: No results found for this basename: CHOL, HDL, LDLCALC, TRIG, CHOLHDL, LDLDIRECT,  in the last 168 hours  LFTs:  Recent Labs Lab 12/28/13 1143  AST 15  ALT 11  ALKPHOS 77  BILITOT 0.3  PROT 6.5  ALBUMIN 3.2*    Pancreatic Enzymes: No results found for this basename: LIPASE, AMYLASE,  in the last 168 hours  Lactic Acid/Procalcitonin:  Recent Labs Lab 12/28/13 1153  LATICACIDVEN 1.42    Ammonia: No results found for this basename: AMMONIA,  in the last 168 hours  Cardiac Enzymes: No results found for this basename: CKTOTAL, CKMB, CKMBINDEX, TROPONINI,  in the last 168 hours Lab Results  Component Value Date   CKTOTAL 123 07/05/2011   CKMB 4.0 07/05/2011   TROPONINI <0.30 07/05/2011    EKG: EKG Interpretation  Date/Time:  Wednesday Dec 28 2013 12:03:08 EDT Ventricular Rate:  58 PR Interval:  122 QRS Duration: 89 QT Interval:  446 QTC Calculation: 438 R Axis:   52 Text Interpretation:  Sinus rhythm Normal ECG Confirmed by POLLINA  MD, CHRISTOPHER [id number]) on 12/28/2013 12:25:01 PM   BNP:  Recent Labs Lab 12/28/13 1146  PROBNP 53.4    D-Dimer: No results found for this basename: DDIMER,  in the last 168 hours  Urinalysis:  Recent Labs Lab 12/28/13 1326  COLORURINE YELLOW  LABSPEC 1.009  PHURINE 7.0  GLUCOSEU NEGATIVE  HGBUR NEGATIVE  BILIRUBINUR NEGATIVE  KETONESUR NEGATIVE  PROTEINUR NEGATIVE  UROBILINOGEN 0.2  NITRITE NEGATIVE  LEUKOCYTESUR  SMALL*    Micro Results: Recent Results (from the past 240 hour(s))  CULTURE, BLOOD (ROUTINE X 2)     Status: None   Collection Time    12/28/13 11:43 AM      Result Value Ref Range Status   Specimen Description BLOOD RIGHT ANTECUBITAL   Final   Special Requests BOTTLES DRAWN AEROBIC AND ANAEROBIC 5CC   Final   Culture  Setup Time     Final   Value: 12/28/2013 16:26     Performed at [facility]   Culture     Final   Value:        BLOOD CULTURE RECEIVED NO GROWTH TO DATE CULTURE WILL BE HELD FOR 5 DAYS BEFORE ISSUING A FINAL NEGATIVE REPORT     Performed at [facility]   Report Status PENDING   Incomplete  CULTURE, BLOOD (ROUTINE X 2)     Status: None   Collection Time    12/28/13 11:56 AM      Result Value Ref Range Status   Specimen Description BLOOD RIGHT FOREARM   Final   Special Requests BOTTLES DRAWN AEROBIC ONLY 5MLS   Final   Culture  Setup Time     Final   Value: 12/28/2013 16:26     Performed at [facility]   Culture     Final   Value:        BLOOD CULTURE RECEIVED NO GROWTH TO DATE CULTURE WILL BE HELD FOR 5 DAYS BEFORE ISSUING A FINAL NEGATIVE REPORT     Performed at [facility]   Report Status PENDING   Incomplete    Blood Culture:    Component Value Date/Time   SDES BLOOD RIGHT FOREARM 12/28/2013 1156   SPECREQUEST BOTTLES DRAWN AEROBIC ONLY 5MLS 12/28/2013 1156   CULT  Value:        BLOOD CULTURE RECEIVED NO GROWTH TO DATE CULTURE WILL BE HELD FOR 5 DAYS BEFORE ISSUING A FINAL NEGATIVE REPORT Performed at [place] 12/28/2013 1156   REPTSTATUS PENDING 12/28/2013 1156    Studies/Results: Dg Chest 2 View  12/28/2013   CLINICAL DATA:  Shortness of breath.  EXAM: CHEST - 2 VIEW  COMPARISON:  07/04/2011  FINDINGS: The heart size and mediastinal contours are within normal limits. There is no evidence of pulmonary edema, consolidation, pneumothorax or nodule. There is a small amount of left pleural fluid. The visualized skeletal  structures are unremarkable.  IMPRESSION: Small left pleural effusion.   Electronically Signed   By: Aletta Edouard M.D.   On: 12/28/2013 13:00  Medications:  Scheduled Meds: . amLODipine  5 mg Oral Daily  . calcium-vitamin D  2 tablet Oral Q breakfast  . cholecalciferol  1,000 Units Oral Daily  . doxycycline  100 mg Oral Q12H  . enoxaparin (LOVENOX) injection  40 mg Subcutaneous Q24H  . metoprolol tartrate  12.5 mg Oral BID  . sodium chloride  3 mL Intravenous Q12H  . spiritus frumenti  1 each Oral Daily  . telmisartan  80 mg Oral Daily   Continuous Infusions:  PRN Meds:.acetaminophen, albuterol, loteprednol, ondansetron (ZOFRAN) IV  Antibiotics: Anti-infectives   Start     Dose/Rate Route Frequency Ordered Stop   12/28/13 2200  doxycycline (VIBRA-TABS) tablet 100 mg     100 mg Oral Every 12 hours 12/28/13 1935       Antibiotics Given (last 72 hours)   Date/Time Action Medication Dose   12/28/13 2255 Given   doxycycline (VIBRA-TABS) tablet 100 mg 100 mg   12/29/13 1003 Given   doxycycline (VIBRA-TABS) tablet 100 mg 100 mg      Day of Hospitalization:  1 Consults:    Assessment/Plan:   Principal Problem:   DVT (deep venous thrombosis)-left Active Problems:   A-fib   Cellulitis  #LE DVT complicated by cellulitis -  Pt has a h/o DVT, with ~2cm healing wound on LL extremity that is without draining and does not appear to be infected.  Vascular surgery recommends elevation of legs at least 3cm.  Plastic surgery recommends no dressings needed and moisturizing lotion as needed.   -bilateral arterial dopplers pending  -continue doxycycline  -neurovascular checks   #Diastolic dysfunction (grade 1)  Stable. Pt does not appear volume overloaded. Echo 07/05/11 revealed LVEF of 60-65% with grade 1 diastolic dysfunction. 12/28/13 BNP: 53.4.  -monitor   #Chronic Kidney Disease -  No complications including volume overload, metabolic acidosis, hyperkalemia,  hyperphosphatemia, hypocalcemia.  Lab Results   Component  Value  Date    CREATININE  0.85  12/28/2013    -BMP to monitor electolytes  -avoid nephrotoxic agents   #Hypertension -  Pt initially normotensive on arrival to ED, but BP increased to 214/69.  -continue home meds   #H/o Atrial Fibrillation -  Pt currently rate controlled. EKG shows NSR. Pt CHADS2: 3 and is not able to tolerate coumadin.  Last INR:  Lab Results   Component  Value  Date    INR  1.07  09/30/2013    INR  1.02  09/29/2013    INR  1.17  09/28/2013   -d/c telemetry -goal HR<110  -continue home meds   #Anemia -  H/H stable; pt has no symptoms/signs of bleeding.  -monitor   #H/o falls -  -PT/OT   #FEN-  Fluids- none Electrolytes- replete as needed Mild hyponatremia - common finding in the elderly; monitor Nutrition- DI[employer]; beer x//day  #Dispo- Disposition is deferred at this time, awaiting improvement of current medical problems.  Anticipated discharge in approximately 1-2 day(s).    LOS: 1 day   Jones Bales, MD PGY-1, Internal Medicine Teaching Service [phone] (7AM-5PM Mon-Fri) 12/29/2013, 2:57 PM

## 2013-12-29 NOTE — Progress Notes (Signed)
HD# 2 for cellulitis LLE.  Patient know to me from avulsion injury Left leg. Has been treated with A Cell x 2 in past, and local wound care while at [facility]. She was admitted for swelling and cellulitis LLE. She has been home from SNF for approximately month.  PE Left leg with nearly resolved cellulitis some induration medial knee Left leg with well controlled edema, one <1 cm scab, no open wounds  A/P: Left leg healed. No need for dressings. May apply moisturizing lotion as needed. Reviewed with patient given chronic DVT on this side will be at lifelong risk of edema and cellulitis, needs to elevate leg throughout day and limit ambulation to ADLs.  Irene Limbo, MD [facility] Plastic & Reconstructive Surgery [phone]

## 2013-12-29 NOTE — Progress Notes (Signed)
I repeated the critical or key portions of the exam.  I confirmed/revised the medical student's history, exam, assessment and plan.

## 2013-12-29 NOTE — Evaluation (Signed)
Occupational Therapy Evaluation Patient Details Name: Shirley Strickland MRN: [id number] DOB: 20-Nov-1915 Today's Date: 12/29/2013    History of Present Illness Admitted with LLE Cellulitis (wound from fall Dec 30th), DVTs LLE   Clinical Impression   This 80 year old female was admitted with cellulitis and DVTs in LLE.  She was living at home with intermittent assistance from niece and had a PCA coming 2x week to help her shower.  Pt could manage most of her adls but could not don socks; she had increased difficulty prior to admission due to swelling/weight of leg.  Pt is very motivated and currently needs min A to occasional mod A for LB dressing.  She will benefit from skilled OT to increase safety and independence with adls.  Goals in acute setting are for overall supervision level.      Follow Up Recommendations  Home health OT [facility] aide; )    Equipment Recommendations  3 in 1 bedside comode (pt may have this)    Recommendations for Other Services       Precautions / Restrictions Precautions Precautions: Fall Restrictions Weight Bearing Restrictions: No      Mobility Bed Mobility Overal bed mobility: Needs Assistance Bed Mobility: Sit to Supine;Supine to Sit     Supine to sit: Min guard Sit to supine: Min guard (effortful; pt wanted to try herself)      Transfers Overall transfer level: Needs assistance Equipment used: Rolling walker (2 wheeled) Transfers: Sit to/from Stand Sit to Stand: Min assist         General transfer comment: assist to rise and steady    Balance Overall balance assessment: Needs assistance Sitting-balance support: Feet supported Sitting balance-Leahy Scale: Fair     Standing balance support: Single extremity supported;During functional activity Standing balance-Leahy Scale: Poor Standing balance comment: Shows short moments of standing without holding, but needs the RW                            ADL Overall ADL's : Needs  assistance/impaired     Grooming: Set up;Sitting   Upper Body Bathing: Set up;Sitting   Lower Body Bathing: Minimal assistance;Sit to/from stand   Upper Body Dressing : Set up;Sitting   Lower Body Dressing: Moderate assistance;Sit to/from stand   Toilet Transfer: Minimal assistance;BSC;Stand-pivot   Toileting- Clothing Manipulation and Hygiene: Minimal assistance;Sit to/from stand       Functional mobility during ADLs: Minimal assistance General ADL Comments: performed spt to [facility] using armrest of commode.  Pt very motivated and wants to do as much as she can for herself     Vision                     Perception     Praxis      Pertinent Vitals/Pain Lower legs are sore to touch; avoided handling them.  Pt can tolerate support at ankles, if she needs assistance     Hand Dominance     Extremity/Trunk Assessment Upper Extremity Assessment Upper Extremity Assessment: Overall WFL for tasks assessed (ROM limited bil shoulders approximately 90)   Lower Extremity Assessment Lower Extremity Assessment: Overall WFL for tasks assessed;Generalized weakness (Left weaker than R LE)       Communication Communication Communication: HOH   Cognition Arousal/Alertness: Awake/alert Behavior During Therapy: WFL for tasks assessed/performed Overall Cognitive Status: Within Functional Limits for tasks assessed  General Comments       Exercises       Shoulder Instructions      Home Living Family/patient expects to be discharged to:: Private residence Living Arrangements: Alone Available Help at Discharge: Available PRN/intermittently;Family;Personal care attendant Type of Home: House       Home Layout: One level     Bathroom Shower/Tub: Occupational psychologist: Standard     Home Equipment: Environmental consultant - 2 wheels (lift chair)   Additional Comments: D/C'd from SNF recently and struggling  due to L LE swelling and pain as well  as weakness      Prior Functioning/Environment Level of Independence: Needs assistance  Gait / Transfers Assistance Needed: Used walker in home-like setting ADL's / Homemaking Assistance Needed: did the majority of her sponge bathing and dressing.  Niece fixes meals to microwave, runs errands and straightens up.        OT Diagnosis: Generalized weakness   OT Problem List: Decreased strength;Decreased activity tolerance;Impaired balance (sitting and/or standing);Decreased knowledge of use of DME or AE   OT Treatment/Interventions: Self-care/ADL training;DME and/or AE instruction;Balance training;Patient/family education;Energy conservation    OT Goals(Current goals can be found in the care plan section) Acute Rehab OT Goals Patient Stated Goal: I want to get back home OT Goal Formulation: With patient Time For Goal Achievement: 01/12/14 Potential to Achieve Goals: Good ADL Goals Pt Will Perform Grooming: with supervision;standing Pt Will Transfer to Toilet: with supervision;bedside commode;ambulating Pt Will Perform Toileting - Clothing Manipulation and hygiene: with supervision;sit to/from stand Additional ADL Goal #1: pt will be independent with AE or verbalize understanding use of this  OT Frequency: Min 3X/week   Barriers to D/C:            Co-evaluation              End of Session    Activity Tolerance: Patient tolerated treatment well Patient left: in bed;with call bell/phone within reach   Time: 1540-1603 OT Time Calculation (min): 23 min Charges:  OT General Charges $OT Visit: 1 Procedure OT Evaluation $Initial OT Evaluation Tier I: 1 Procedure OT Treatments $Self Care/Home Management : 8-22 mins G-Codes:    Lesle Chris 01-03-2014, 4:29 PM  Lesle Chris, OTR/L [phone] 01-03-14

## 2013-12-29 NOTE — Care Management Note (Signed)
  Page 1 of 1   12/30/2013     10:29:24 AM CARE MANAGEMENT NOTE 12/30/2013  Patient:  Shirley Strickland, Shirley Strickland   Account Number:  [id number]  Date Initiated:  12/29/2013  Documentation initiated by:  Magdalen Spatz  Subjective/Objective Assessment:     Action/Plan:   Anticipated DC Date:  12/30/2013   Anticipated DC Plan:  [place]         Choice offered to / List presented to:  C-1 Patient        [place] arranged  HH-1 RN  [place] OT      [place] agency  [place]   Status of service:  Completed, signed off Medicare Important Message given?   (If response is "NO", the following Medicare IM given date fields will be blank) Date Medicare IM given:   Date Additional Medicare IM given:    Discharge Disposition:  [place]  Per UR Regulation:    If discussed at Long Length of Stay Meetings, dates discussed:    Comments:

## 2013-12-30 DIAGNOSIS — E871 Hypo-osmolality and hyponatremia: Secondary | ICD-10-CM

## 2013-12-30 DIAGNOSIS — M545 Low back pain, unspecified: Secondary | ICD-10-CM

## 2013-12-30 DIAGNOSIS — I4891 Unspecified atrial fibrillation: Secondary | ICD-10-CM

## 2013-12-30 DIAGNOSIS — I10 Essential (primary) hypertension: Secondary | ICD-10-CM

## 2013-12-30 DIAGNOSIS — I82409 Acute embolism and thrombosis of unspecified deep veins of unspecified lower extremity: Secondary | ICD-10-CM

## 2013-12-30 MED ORDER — AMLODIPINE BESYLATE 5 MG PO TABS: 5.0000 mg | ORAL_TABLET | Freq: Every day | ORAL | Status: DC

## 2013-12-30 MED ORDER — ONDANSETRON HCL 4 MG PO TABS: 4.0000 mg | ORAL_TABLET | Freq: Three times a day (TID) | ORAL | Status: DC | PRN

## 2013-12-30 MED ORDER — DOXYCYCLINE HYCLATE 100 MG PO TABS: 100.0000 mg | ORAL_TABLET | Freq: Two times a day (BID) | ORAL | Status: DC

## 2013-12-30 MED ORDER — ONDANSETRON HCL 4 MG/2ML IJ SOLN: 4.0000 mg | Freq: Four times a day (QID) | INTRAMUSCULAR | Status: DC | PRN

## 2013-12-30 NOTE — Progress Notes (Signed)
Occupational Therapy Treatment Patient Details Name: Shirley Strickland MRN: [id number] DOB: 03-24-1916 Today's Date: 12/30/2013    History of present illness Admitted with LLE Cellulitis (wound from fall Dec 30th), DVTs LLE   OT comments  Pt. States "i need help practicing getting in/out of bed".  Had pt. Complete bed mobility drills with emphasis on sequencing of steps.  Pt. Eventually able to bring B LEs in/out of bed after several drills.  Required inst. Cues for hand placement on bed to assist with bringing trunk upright.  States she may be d/c home this pm but expressed concerns for being home alone.    Follow Up Recommendations  Home health OT    Equipment Recommendations  3 in 1 bedside comode          Precautions / Restrictions Precautions Precautions: Fall       Mobility Bed Mobility Overal bed mobility: Needs Assistance Bed Mobility: Rolling;Sidelying to Sit;Supine to Sit;Sit to Supine;Sit to Sidelying Rolling: Min guard Sidelying to sit: Min guard Supine to sit: Min guard Sit to supine: Min guard Sit to sidelying: Min guard General bed mobility comments: pts. chief complaint "i just cant get in/out of bed real good" had pt. perfrom bed mobility x 3 reinforcing sequencing through repitition of steps.  able to complete in/out of bed with out physical assistance but required inst. cues for hand placement and sequencing of tech.  Transfers Overall transfer level: Needs assistance Equipment used: Rolling walker (2 wheeled) Transfers: Sit to/from Stand Sit to Stand: Min assist         General transfer comment: assist to rise and steady                                       ADL Overall ADL's : Needs assistance/impaired                         Toilet Transfer: Min Public librarian Details (indicate cue type and reason): cues for backing up to surface before beginning to sit down Toileting- Clothing Manipulation and  Hygiene: Minimal assistance;Sit to/from stand         General ADL Comments: required cues for walker safety and hand placement during pivot transfers                                                                                                    Pertinent Vitals/ Pain       States b le pain improved but still present most around knees                                                          Frequency Min 3X/week     Progress Toward Goals  OT Goals(current goals can now be found in the care  plan section)  Progress towards OT goals: Progressing toward goals     Plan                       End of Session Equipment Utilized During Treatment: Rolling walker   Activity Tolerance Patient tolerated treatment well   Patient Left in chair;with call bell/phone within reach             Time: 1150-1215 OT Time Calculation (min): 25 min  Charges: OT General Charges $OT Visit: 1 Procedure OT Treatments $Self Care/Home Management : 23-37 mins  Rico Junker Bonham Zingale, COTA/L 12/30/2013, 12:27 PM

## 2013-12-30 NOTE — Progress Notes (Signed)
Subjective: Pt c/o low back pain 2/10 x 1 week. She is worried about her being able to get OOB in the hospital.  She has not been able to stand as well as at home.  Otherwise ready to go home.     Objective: Vital signs in last 24 hours: Filed Vitals:   12/28/13 2121 12/29/13 0510 12/29/13 2142 12/30/13 0640  BP: 153/45 171/61 142/51 174/83  Pulse: 68 62 68 56  Temp: 98.2 F (36.8 C) 98 F (36.7 C) 98.3 F (36.8 C) 97.8 F (36.6 C)  TempSrc: Oral Oral Oral Oral  Resp: 20 20 20 18   Height:      Weight:  146 lb 6.4 oz (66.407 kg)  146 lb 8 oz (66.452 kg)  SpO2: 94% 97% 100% 98%   Weight change: 8 lb 8 oz (3.856 kg)  Intake/Output Summary (Last 24 hours) at 12/30/13 1215 Last data filed at 12/29/13 1837  Gross per 24 hour  Intake    960 ml  Output      0 ml  Net    960 ml   Vitals reviewed. General: resting in bed, NAD HEENT: [place]/at, no scleral icterus Cardiac: RRR, no rubs, murmurs or gallops Pulm: clear to auscultation bilaterally, no wheezes, rales, or rhonchi Abd: soft, nontender, nondistended, BS present Ext: warm and well perfused, improved edema to b/l lower ext L>R; improved redness to left lower ext Neuro: alert and oriented X3, cranial nerves II-XII grossly intact, moving all 4 ext.   Lab Results: Basic Metabolic Panel:  Recent Labs Lab 12/28/13 1143 12/29/13 0530  NA 132* 133*  K 4.5 4.9  CL 94* 98  CO2 26 26  GLUCOSE 128* 93  BUN 11 12  CREATININE 0.85 0.92  CALCIUM 10.0 9.4   Liver Function Tests:  Recent Labs Lab 12/28/13 1143  AST 15  ALT 11  ALKPHOS 77  BILITOT 0.3  PROT 6.5  ALBUMIN 3.2*   CBC:  Recent Labs Lab 12/28/13 1143  WBC 5.6  NEUTROABS 3.4  HGB 10.1*  HCT 31.8*  MCV 84.8  PLT 226   BNP:  Recent Labs Lab 12/28/13 1146  PROBNP 53.4   Urinalysis:  Recent Labs Lab 12/28/13 1326  COLORURINE YELLOW  LABSPEC 1.009  PHURINE 7.0  GLUCOSEU NEGATIVE  HGBUR NEGATIVE  BILIRUBINUR NEGATIVE  KETONESUR NEGATIVE   PROTEINUR NEGATIVE  UROBILINOGEN 0.2  NITRITE NEGATIVE  LEUKOCYTESUR SMALL*   Misc. Labs: none  Micro Results: Recent Results (from the past 240 hour(s))  CULTURE, BLOOD (ROUTINE X 2)     Status: None   Collection Time    12/28/13 11:43 AM      Result Value Ref Range Status   Specimen Description BLOOD RIGHT ANTECUBITAL   Final   Special Requests BOTTLES DRAWN AEROBIC AND ANAEROBIC 5CC   Final   Culture  Setup Time     Final   Value: 12/28/2013 16:26     Performed at [facility]   Culture     Final   Value:        BLOOD CULTURE RECEIVED NO GROWTH TO DATE CULTURE WILL BE HELD FOR 5 DAYS BEFORE ISSUING A FINAL NEGATIVE REPORT     Performed at [facility]   Report Status PENDING   Incomplete  CULTURE, BLOOD (ROUTINE X 2)     Status: None   Collection Time    12/28/13 11:56 AM      Result Value Ref Range Status  Specimen Description BLOOD RIGHT FOREARM   Final   Special Requests BOTTLES DRAWN AEROBIC ONLY 5MLS   Final   Culture  Setup Time     Final   Value: 12/28/2013 16:26     Performed at [facility]   Culture     Final   Value:        BLOOD CULTURE RECEIVED NO GROWTH TO DATE CULTURE WILL BE HELD FOR 5 DAYS BEFORE ISSUING A FINAL NEGATIVE REPORT     Performed at [facility]   Report Status PENDING   Incomplete   Studies/Results: Dg Chest 2 View  12/28/2013   CLINICAL DATA:  Shortness of breath.  EXAM: CHEST - 2 VIEW  COMPARISON:  07/04/2011  FINDINGS: The heart size and mediastinal contours are within normal limits. There is no evidence of pulmonary edema, consolidation, pneumothorax or nodule. There is a small amount of left pleural fluid. The visualized skeletal structures are unremarkable.  IMPRESSION: Small left pleural effusion.   Electronically Signed   By: Aletta Edouard M.D.   On: 12/28/2013 13:00   Medications:  Scheduled Meds: . amLODipine  5 mg Oral Daily  . calcium-vitamin D  2 tablet Oral Q breakfast  . cholecalciferol   1,000 Units Oral Daily  . doxycycline  100 mg Oral Q12H  . enoxaparin (LOVENOX) injection  40 mg Subcutaneous Q24H  . metoprolol tartrate  12.5 mg Oral BID  . sodium chloride  3 mL Intravenous Q12H  . spiritus frumenti  1 each Oral Daily  . telmisartan  80 mg Oral Daily   Continuous Infusions:  PRN Meds:.acetaminophen, albuterol, loteprednol, ondansetron (ZOFRAN) IV Assessment/Plan: 80 y.o PMH syncope, falls, HTN, Afib, osteopenia, chronic DVT noted on [place] 08/2013 after fall, GERD, ?CHF (echo with EF 23-76% grade 1 diastolic dysfunction)  #Chronic DVT (deep venous thrombosis)-left -noted since 08/2013 with repeat US +DVT.  Patient is not a candidate for anticoagulation due to previous drop in Hbg with initiation of Lovenox and Coumadin -arterial dopplers unable to assess but good pulses -rec elevation at home  #Left leg cellulitis  -will treat with Doxycycline total of 7 days with prn nausea. Will likely be chronic given untreated DVT -follow up with PCP Dr. Alvester Chou with home visits [phone] (f), phone [phone] -chronic wound LLE well healing; evaluated at [facility]. With wound care recs to moisturize  #Low back pain -can try warm compress or prn Tylenol  #HTN -will resume home medications Lopressor, Micardis, will add Norvasc 5 and have PCP f/u   #Atrial fibrillation  -CHADSVASC 4.  Not a candidate for anticoagulation as risks outweigh the benefits and h/o anemia   #Difficulty with ambulation OOB -will get PT to evaluate again. She already has H/H  #F/E/N -NSL -Chronic asymptomatic hyponatremia-will repeat BMET outpatient -cardiac diet   #DVT px -lovenox       Dispo: D/c today   The patient does have a current PCP Dr. Aris Lot and does need an [facility] hospital follow-up appointment after discharge.  The patient does have transportation limitations that hinder transportation to clinic appointments.  .Services Needed at time of discharge: Y = Yes, Blank = No PT: Y  OT: Y  RN:  Y  Equipment: Already has bedside commode   Other: RN    LOS: 2 days   Cresenciano Genre, MD [phone] 12/30/2013, 12:15 PM

## 2013-12-30 NOTE — Discharge Summary (Signed)
Name: CLEMMA MANDALA MRN: [id number] DOB: 1916/08/13 80 y.o. PCP: [name], MD  Date of Admission: 12/28/2013 11:00 AM Date of Discharge: 12/30/2013 Attending Physician: Bartholomew Crews, MD  Discharge Diagnosis: 1. Chronic DVT (deep venous thrombosis)-left  2. Left leg cellulitis  3. Low back pain  4. HTN  5. Atrial fibrillation  6. Difficulty with ambulation OOB 7. Chronic asymptomatic hyponatremia  Discharge Medications:   Medication List         amLODipine 5 MG tablet  Commonly known as:  NORVASC  Take 1 tablet (5 mg total) by mouth daily.     calcium-vitamin D 500-200 MG-UNIT per tablet  Commonly known as:  OSCAL WITH D  Take 2 tablets by mouth daily.     cholecalciferol 1000 UNITS tablet  Commonly known as:  VITAMIN D  Take 1,000 Units by mouth daily.     doxycycline 100 MG tablet  Commonly known as:  VIBRA-TABS  Take 1 tablet (100 mg total) by mouth every 12 (twelve) hours.     LOTEMAX 0.5 % ophthalmic suspension  Generic drug:  loteprednol  Place 1 drop into both eyes daily as needed (itching).     metoprolol tartrate 12.5 mg Tabs tablet  Commonly known as:  LOPRESSOR  Take 0.5 tablets (12.5 mg total) by mouth 2 (two) times daily.     ondansetron 4 MG tablet  Commonly known as:  ZOFRAN  Take 1 tablet (4 mg total) by mouth every 8 (eight) hours as needed for nausea or vomiting.     PROAIR HFA 108 (90 BASE) MCG/ACT inhaler  Generic drug:  albuterol  Inhale 1 puff into the lungs 2 (two) times daily.     spiritus frumenti Soln  Commonly known as:  ethyl alcohol  Take 1 each by mouth 2 (two) times daily. Pt drinks budweiser beer twice daily     SYSTANE OP  Place 1 drop into both eyes daily as needed (dry eyes).     telmisartan 80 MG tablet  Commonly known as:  MICARDIS  Take 80 mg by mouth daily.        Disposition and follow-up:   Ms.Halley C Keasling was discharged from [facility] in stable condition.  At the hospital  follow up visit please address:  1.   -pain control -completion of oral Doxycycline  -Is patient elevating legs? -repeat BMET for chronic hyponatremia -Activity level of the patient at home  2.  Labs / imaging needed at time of follow-up: see above   3.  Pending labs/ test needing follow-up: none  Follow-up Appointments:   Discharge Instructions: Discharge Orders   Future Orders Complete By Expires   Discharge instructions  As directed    Increase activity slowly  As directed       Consultations:  Dr. Estil Daft   Procedures Performed:  Dg Chest 2 View  12/28/2013   CLINICAL DATA:  Shortness of breath.  EXAM: CHEST - 2 VIEW  COMPARISON:  07/04/2011  FINDINGS: The heart size and mediastinal contours are within normal limits. There is no evidence of pulmonary edema, consolidation, pneumothorax or nodule. There is a small amount of left pleural fluid. The visualized skeletal structures are unremarkable.  IMPRESSION: Small left pleural effusion.   Electronically Signed   By: Aletta Edouard M.D.   On: 12/28/2013 13:00    Admission HPI:  History of Present Illness: JESSAH LUYSTER is a 80 y.o. female who has a past medical  history of cellulitis, LE DVT, HTN, chronic diastolic HF, osteopenia, A-fib (not on ASA or coumadin), left-metatarsal fracture, and PNA. Pt presents to the ED with complaints of left LE swelling, erythema, and pain that has gradually gotten worse after over the past couple of days. She has chronic venous stasis. She reports having increased pain and swelling in the left leg to the point that it was difficult for her to walk. She has had multiple surgeries on the leg by plastic surgery for skin loss. She has tried to wear compression stockings in the past but is not able to. She does however elevate her legs with a "wedge" that she has on her bed. She was told that she had "diseased legs." Today, she had a new [facility] physician that had nevercome out to her house and  thought that her leg looked infected and needed antibiotics. She denies any HA, fever/chills, N/V/D/C, abdominal pain, dysuria, or focal weakness. She normally ambulates with a walker and was discharged from [facility] on April 21 after a 3 month stay after 2/5 discharge for cellulitis/DVT. She lives alone, and since that time, she has had home health services. She needs helps with ADLs including bathing and cooking. She has a niece that cooks meals for her and checks on her daily.  Of note, pt discharged on 2/5 with left lower extremity cellulitis and DVT (common femoral vein, left femoral vein, posterior tibial vein). Initially on ceftriaxone and changed to doxycycline; completed 7 day course. She was started on anticoagulation for DVT which led to hgb drop from 9.5-->7.7 and was given 1 unit of prbcs with hgb back up to 9.6. Hemoccult negative. IR placed an IVC filter on 09/27/13.  She is followed by Dr. Iran Planas, plastic and reconstructive surgery, who will see the patient while she is here.   Review of Systems:  Pertinent items are noted in HPI.  Physical Exam:  Filed Vitals:    12/28/13 2121   BP:  153/45   Pulse:  68   Temp:  98.2 F (36.8 C)   Resp:  20    Physical Exam  Constitutional: Vital signs reviewed. Patient is a well-developed, well-nourished female in no acute distress and cooperative with exam. She appears younger than her stated age.  Head: Normocephalic and atraumatic  Eyes: PERRL, EOMI, conjunctivae normal, No scleral icterus.  Neck: Supple, Trachea midline  Cardiovascular: RRR, no MRG  Pulmonary/Chest: normal respiratory effort, CTAB, no wheezes, rales, or rhonchi  Abdominal: Soft. Non-tender, non-distended, bowel sounds are normal, no masses, organomegaly, or guarding present  Musculoskeletal: No joint deformities, erythema, or stiffness Neurological: A&O x3, cranial nerve II-XII are grossly intact, no focal motor deficit  Skin: Warm, dry and intact. She has erythema,  exquisite tenderness, and swelling of both LE L>R. She has chronic venous statis dermatitis b/l. She has ~2cm healing wound on her left leg that does not appear infected-it is without drainage.  Psychiatric: Normal mood and affect.     Hospital Course by problem list: 1. Chronic DVT (deep venous thrombosis)-left  2. Left leg cellulitis  3. Low back pain  4. HTN  5. Atrial fibrillation  6. Difficulty with ambulation OOB 7. Chronic asymptomatic hyponatremia   80 y.o PMH syncope, falls, HTN, Afib, osteopenia, chronic DVT noted on [place] 08/2013 after fall, GERD, ?CHF (echo with EF 15-83% grade 1 diastolic dysfunction)   1. Chronic DVT (deep venous thrombosis)-left  Noted since 08/2013 with repeat US +DVT status post IVC filter 09/27/13. Patient  is not a candidate for anticoagulation due to previous drop in Hemoglobin with initiation of Lovenox and Coumadin.  Arterial dopplers unable to assess due to patient intolerability due to pain but good pulses.  Recommend elevation at home since patient cant tolerate anticoagulation or TED hose.  Vascular surgery Dr. Kellie Simmering saw the patient this admission with rec. Elevation of legs 3 inches at night and frequently during the day and treatment of low grade cellulitis with antibiotics.    2. Left leg cellulitis  Will treat with Doxycycline total of 7 days with as needed nausea. Will likely be chronic given untreated DVT.  Follow up with PCP Dr. Alvester Chou with home visits [phone] (f), phone [phone].  Chronic wound LLE well healing.  Evaluated at hospital by Dr. Iran Planas with wound care rec. to moisturize.     3. Low back pain  Can try warm compress or prn Tylenol   4. HTN  Hypertension this admission.  We will resume home medications Lopressor, Micardis, will add Norvasc 5 and have PCP follow up on blood pressure.   5. Atrial fibrillation  CHADSVASC 4. Not a candidate for anticoagulation as risks outweigh the benefits and she has history anemia with  hemoglobin dropping to 7.2 when Coumadin, therapeutic Lovenox dose was initiated   6. Difficulty with ambulation OOB  PT evaluated. She already has H/H Therapist, sports, RN aid, PT, OT which was reestablished prior to discharge.    7. Chronic asymptomatic hyponatremia Will repeat BMET outpatient   DVT prophylaxis She was given Lovenox this admission      Discharge Vitals:   BP 174/83  Pulse 56  Temp(Src) 97.8 F (36.6 C) (Oral)  Resp 18  Ht 5\' 2"  (1.575 m)  Wt 146 lb 8 oz (66.452 kg)  BMI 26.79 kg/m2  SpO2 98%  Discharge exam  General: resting in bed, NAD  HEENT: [place]/at, no scleral icterus  Cardiac: RRR, no rubs, murmurs or gallops  Pulm: clear to auscultation bilaterally, no wheezes, rales, or rhonchi  Abd: soft, nontender, nondistended, BS present  Ext: warm and well perfused, improved edema to b/l lower ext L>R; improved redness to left lower ext  Neuro: alert and oriented X3, cranial nerves II-XII grossly intact, moving all 4 ext.   Discharge Labs:  Results for CELIE, DESROCHERS (MRN [id number]) as of 12/30/2013 13:52  Ref. Range 12/28/2013 11:43 12/28/2013 11:46 12/28/2013 11:53 12/29/2013 05:30  Sodium Latest Range: 137-147 mEq/L 132 (L)   133 (L)  Potassium Latest Range: 3.7-5.3 mEq/L 4.5   4.9  Chloride Latest Range: 96-112 mEq/L 94 (L)   98  CO2 Latest Range: 19-32 mEq/L 26   26  BUN Latest Range: 6-23 mg/dL 11   12  Creatinine Latest Range: 0.50-1.10 mg/dL 0.85   0.92  Calcium Latest Range: 8.4-10.5 mg/dL 10.0   9.4  GFR calc non Af Amer Latest Range: >90 mL/min 56 (L)   51 (L)  GFR calc Af Amer Latest Range: >90 mL/min 65 (L)   59 (L)  Glucose Latest Range: 70-99 mg/dL 128 (H)   93  Alkaline Phosphatase Latest Range: 39-117 U/L 77     Albumin Latest Range: 3.5-5.2 g/dL 3.2 (L)     AST Latest Range: 0-37 U/L 15     ALT Latest Range: 0-35 U/L 11     Total Protein Latest Range: 6.0-8.3 g/dL 6.5     Total Bilirubin Latest Range: 0.3-1.2 mg/dL 0.3     Pro B Natriuretic peptide (BNP)  Latest  Range: 0-450 pg/mL  53.4    Lactic Acid, Venous Latest Range: 0.5-2.2 mmol/L   1.42    Results for TONJIA, PARILLO (MRN [id number]) as of 12/30/2013 13:52  Ref. Range 12/28/2013 11:43  WBC Latest Range: 4.0-10.5 K/uL 5.6  RBC Latest Range: 3.87-5.11 MIL/uL 3.75 (L)  Hemoglobin Latest Range: 12.0-15.0 g/dL 10.1 (L)  HCT Latest Range: 36.0-46.0 % 31.8 (L)  MCV Latest Range: 78.0-100.0 fL 84.8  MCH Latest Range: 26.0-34.0 pg 26.9  MCHC Latest Range: 30.0-36.0 g/dL 31.8  RDW Latest Range: 11.5-15.5 % 14.7  Platelets Latest Range: 150-400 K/uL 226  Neutrophils Relative % Latest Range: 43-77 % 61  Lymphocytes Relative Latest Range: 12-46 % 24  Monocytes Relative Latest Range: 3-12 % 13 (H)  Eosinophils Relative Latest Range: 0-5 % 2  Basophils Relative Latest Range: 0-1 % 0  NEUT# Latest Range: 1.7-7.7 K/uL 3.4  Lymphocytes Absolute Latest Range: 0.7-4.0 K/uL 1.3  Monocytes Absolute Latest Range: 0.1-1.0 K/uL 0.7  Eosinophils Absolute Latest Range: 0.0-0.7 K/uL 0.1  Basophils Absolute Latest Range: 0.0-0.1 K/uL 0.0   Results for MAKAIYAH, SCHWEIGER (MRN [id number]) as of 12/30/2013 13:52  Ref. Range 12/28/2013 13:26  Color, Urine Latest Range: YELLOW  YELLOW  APPearance Latest Range: CLEAR  CLEAR  Specific Gravity, Urine Latest Range: 1.005-1.030  1.009  pH Latest Range: 5.0-8.0  7.0  Glucose Latest Range: NEGATIVE mg/dL NEGATIVE  Bilirubin Urine Latest Range: NEGATIVE  NEGATIVE  Ketones, ur Latest Range: NEGATIVE mg/dL NEGATIVE  Protein Latest Range: NEGATIVE mg/dL NEGATIVE  Urobilinogen, UA Latest Range: 0.0-1.0 mg/dL 0.2  Nitrite Latest Range: NEGATIVE  NEGATIVE  Leukocytes, UA Latest Range: NEGATIVE  SMALL (A)  Hgb urine dipstick Latest Range: NEGATIVE  NEGATIVE  WBC, UA Latest Range: <3 WBC/hpf 3-6  RBC / HPF Latest Range: <3 RBC/hpf 0-2  Squamous Epithelial / LPF Latest Range: RARE  RARE  Bacteria, UA Latest Range: RARE  FEW (A)   Blood cultures negative   Signed: Cresenciano Genre, MD 12/30/2013, 2:13 PM   Time Spent on Discharge: >30 minutes Services Ordered on Discharge: H/H PT, OT, RN, RN aid  Equipment Ordered on Discharge: patient has bedside commode at home

## 2013-12-30 NOTE — Progress Notes (Signed)
Physical Therapy Treatment Patient Details Name: Shirley Strickland MRN: [id number] DOB: 10-27-1915 Today's Date: 12/30/2013    History of Present Illness Admitted with LLE Cellulitis (wound from fall Dec 30th), DVTs LLE    PT Comments    Pt has some level of difficulty getting up from lower surfaces and getting in and out of the bed even after practice and pointers for best technique.  She is even guarded with gait using the RW.  But was able to manage all of the above with struggle.  If SNF was a viable option, that would be the best option, but I think she has used up all of her benefits.  Hopefully, he niece can work on her getting more CNA care.  Follow Up Recommendations  Home health PT;Other (comment) (as many services as possible)     Equipment Recommendations  None recommended by PT    Recommendations for Other Services       Precautions / Restrictions Precautions Precautions: Fall Restrictions Weight Bearing Restrictions: No    Mobility  Bed Mobility Overal bed mobility: Needs Assistance Bed Mobility: Sidelying to Sit;Sit to Sidelying Rolling: Min guard Sidelying to sit: Min guard Supine to sit: Min guard Sit to supine: Min guard Sit to sidelying: Min guard General bed mobility comments: Worked on sequencing through getting in/out of bed again trying to simulate some of the methods she has used at home.  Worked on trying to build momentum which was difficult for her..  She did end up completing the task without assist, but with struggle  Transfers Overall transfer level: Needs assistance Equipment used: Rolling walker (2 wheeled) Transfers: Sit to/from Stand Sit to Stand: Min guard;Min assist (min assist for lower surfaces)         General transfer comment: worked on technique to help her get up from lower surface, but still needs min assist to help her come forward.  Ambulation/Gait Ambulation/Gait assistance: Min guard Ambulation Distance (Feet): 40 Feet (to  bathroom and back to the bed.  Emphasis on tight spaces.) Assistive device: Rolling walker (2 wheeled) Gait Pattern/deviations: Decreased step length - right;Decreased step length - left;Decreased stride length;Step-through pattern Gait velocity: slow   General Gait Details: mildly unsteady and guarded.  Takes little steps, turns slowly and tentatively   Financial trader Rankin (Stroke Patients Only)       Balance Overall balance assessment: Needs assistance   Sitting balance-Leahy Scale: Good Sitting balance - Comments: e   Standing balance support: No upper extremity supported;During functional activity Standing balance-Leahy Scale: Poor                      Cognition Arousal/Alertness: Awake/alert Behavior During Therapy: WFL for tasks assessed/performed Overall Cognitive Status: Within Functional Limits for tasks assessed                      Exercises      General Comments        Pertinent Vitals/Pain     Home Living                      Prior Function            PT Goals (current goals can now be found in the care plan section) Acute Rehab PT Goals Patient Stated Goal: I want to get back home PT Goal  Formulation: With patient Time For Goal Achievement: 01/05/14 Potential to Achieve Goals: Good Progress towards PT goals: Progressing toward goals    Frequency  Min 3X/week    PT Plan Current plan remains appropriate    Co-evaluation             End of Session   Activity Tolerance: Patient tolerated treatment well Patient left: in chair;with call bell/phone within reach     Time: 1422-1448 PT Time Calculation (min): 26 min  Charges:  $Gait Training: 8-22 mins $Therapeutic Activity: 8-22 mins                    G CodesTessie Fass Jazzy Parmer 12/30/2013, 4:03 PM 12/30/2013  Donnella Sham, PT [phone]  (pager)

## 2013-12-30 NOTE — Progress Notes (Signed)
VASCULAR LAB PRELIMINARY  ARTERIAL  ABI completed:    RIGHT    LEFT    PRESSURE WAVEFORM  PRESSURE WAVEFORM  BRACHIAL 191 Triphasic BRACHIAL 194 Triphasic  DP * Triphasic DP * Monophasic  AT   AT    PT * * PT * Triphasic  PER   PER    GREAT TOE  NA GREAT TOE  NA    RIGHT LEFT  ABI Unable to calculate Unable to calculate   Patient was unable to tolerate insonation of the right posterior tibial artery, as well as the lower extremity pressures. Right dorsalis pedis and left posterior tibial arteries exhibit waveforms which are within normal limits.  12/30/2013 10:59 AM Maudry Mayhew, RVT, RDCS, RDMS

## 2013-12-30 NOTE — Progress Notes (Signed)
  Date: 12/30/2013  Patient name: Shirley Strickland  Medical record number: [id number]  Date of birth: Nov 04, 1915   This patient has been seen and the plan of care was discussed with the house staff. Please see their note for complete details. I concur with their findings with the following additions/corrections: [name] is feeling better but concerned bc she is unable to get OOB in the hospital [facility] floor, bed too high, no headboard to grab onto). OT worked with her today and with coaching, she was able to. Home today with PT/OT/aide/RN.  Bartholomew Crews, MD 12/30/2013, 2:59 PM

## 2013-12-30 NOTE — Clinical Social Work Note (Signed)
CSW received consult for ambulance transportation at time of discharge. CSW contacted pt's niece, Ms. Nance, to confirm home address. Pt's niece confirmed pt's discharge address as [address] in [place], [place]. Lasana confirmed with RN of pt's readiness to discharge. CSW has arranged ambulance transportation via [place].  CSW signing off.  Pati Gallo, [place] Social Worker [phone]

## 2014-01-03 LAB — CULTURE, BLOOD (ROUTINE X 2)
Culture: NO GROWTH
Culture: NO GROWTH

## 2015-10-22 ENCOUNTER — Emergency Department (HOSPITAL_COMMUNITY): Payer: Medicare Other

## 2015-10-22 ENCOUNTER — Encounter (HOSPITAL_COMMUNITY): Payer: Self-pay | Admitting: *Deleted

## 2015-10-22 ENCOUNTER — Emergency Department (HOSPITAL_COMMUNITY)
Admission: EM | Admit: 2015-10-22 | Discharge: 2015-10-22 | Disposition: A | Discharge status: Home / Self Care | Payer: Medicare Other | Attending: Emergency Medicine | Admitting: Emergency Medicine

## 2015-10-22 DIAGNOSIS — Z880 Allergy status to penicillin: Secondary | ICD-10-CM | POA: Diagnosis not present

## 2015-10-22 DIAGNOSIS — Z85828 Personal history of other malignant neoplasm of skin: Secondary | ICD-10-CM | POA: Diagnosis not present

## 2015-10-22 DIAGNOSIS — I10 Essential (primary) hypertension: Secondary | ICD-10-CM | POA: Insufficient documentation

## 2015-10-22 DIAGNOSIS — Z86718 Personal history of other venous thrombosis and embolism: Secondary | ICD-10-CM | POA: Diagnosis not present

## 2015-10-22 DIAGNOSIS — R42 Dizziness and giddiness: Secondary | ICD-10-CM | POA: Insufficient documentation

## 2015-10-22 DIAGNOSIS — R079 Chest pain, unspecified: Secondary | ICD-10-CM | POA: Diagnosis not present

## 2015-10-22 DIAGNOSIS — Z862 Personal history of diseases of the blood and blood-forming organs and certain disorders involving the immune mechanism: Secondary | ICD-10-CM | POA: Insufficient documentation

## 2015-10-22 DIAGNOSIS — Z79899 Other long term (current) drug therapy: Secondary | ICD-10-CM | POA: Insufficient documentation

## 2015-10-22 DIAGNOSIS — I509 Heart failure, unspecified: Secondary | ICD-10-CM | POA: Diagnosis not present

## 2015-10-22 DIAGNOSIS — Z8701 Personal history of pneumonia (recurrent): Secondary | ICD-10-CM | POA: Insufficient documentation

## 2015-10-22 DIAGNOSIS — M8580 Other specified disorders of bone density and structure, unspecified site: Secondary | ICD-10-CM | POA: Diagnosis not present

## 2015-10-22 DIAGNOSIS — R55 Syncope and collapse: Secondary | ICD-10-CM | POA: Diagnosis not present

## 2015-10-22 DIAGNOSIS — Z8719 Personal history of other diseases of the digestive system: Secondary | ICD-10-CM | POA: Insufficient documentation

## 2015-10-22 LAB — CBC
HEMATOCRIT: 35.9 % — AB (ref 36.0–46.0)
HEMOGLOBIN: 11.9 g/dL — AB (ref 12.0–15.0)
MCH: 28.5 pg (ref 26.0–34.0)
MCHC: 33.1 g/dL (ref 30.0–36.0)
MCV: 86.1 fL (ref 78.0–100.0)
Platelets: 236 10*3/uL (ref 150–400)
RBC: 4.17 MIL/uL (ref 3.87–5.11)
RDW: 13.5 % (ref 11.5–15.5)
WBC: 8.8 10*3/uL (ref 4.0–10.5)

## 2015-10-22 LAB — BASIC METABOLIC PANEL
Anion gap: 10 (ref 5–15)
BUN: 21 mg/dL — ABNORMAL HIGH (ref 6–20)
CO2: 25 mmol/L (ref 22–32)
CREATININE: 0.87 mg/dL (ref 0.44–1.00)
Calcium: 10 mg/dL (ref 8.9–10.3)
Chloride: 101 mmol/L (ref 101–111)
GFR calc non Af Amer: 54 mL/min — ABNORMAL LOW (ref >60)
Glucose, Bld: 122 mg/dL — ABNORMAL HIGH (ref 65–99)
Potassium: 4.8 mmol/L (ref 3.5–5.1)
SODIUM: 136 mmol/L (ref 135–145)

## 2015-10-22 LAB — I-STAT TROPONIN, ED: Troponin i, poc: 0.01 ng/mL (ref 0.00–0.08)

## 2015-10-22 NOTE — ED Provider Notes (Signed)
CSN: [id number]     Arrival date & time 10/22/15  1244 History   First MD Initiated Contact with Patient 10/22/15 1320     Chief Complaint  Patient presents with  . Chest Pain  . Near Syncope     (Consider location/radiation/quality/duration/timing/severity/associated sxs/prior Treatment) Patient is a 82 y.o. female presenting with chest pain and near-syncope. The history is provided by the patient and a friend.  Chest Pain Associated symptoms: near-syncope   Associated symptoms: no abdominal pain, no back pain, no fever, no nausea, no shortness of breath and not vomiting   Near Syncope Associated symptoms include chest pain. Pertinent negatives include no abdominal pain and no shortness of breath.    Patient currently having adjustment of her blood pressure medicine. Patient awoke this morning early with [place] of midsternal chest pain that was not severe 82 out of 10 very brief in nature. Patient also felt as if she was to pass out when she was in the kitchen she felt lightheaded but she did not pass out. Patient's blood pressure has been elevated and her physician has been adjusting medications to get it under control. Patient denies severe headache chest pain trouble breathing or any strokelike symptoms.  Past Medical History  Diagnosis Date  . 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 Cancer [facility])     pt states only skin cancer  . Numbness of legs     started after fall yesterday 07/03/11  . Bruise     bruise on left foot from fall 07/03/11  . Knee pain, acute 07/04/11    Pt states left knee twisted under her in fall 07/03/11 and now painful  . Rash and nonspecific skin eruption 07/04/11    rash on bilateral upper legs  . Syncope   . Fall   . HTN (hypertension)   . A-fib ([place])   . Pneumonia   . Osteopenia   . DVT (deep venous thrombosis) ([place]) 12/28/2013    LEFT   . CHF (congestive heart failure) ([place])   . GERD (gastroesophageal reflux disease)    Past Surgical History   Procedure Laterality Date  . Elbow surgery      right  . Appendectomy    . Exploratory laparotomy  07/04/11    years ago to find cause of bleeding  . Cataract extraction     No family history on file. Social History  Substance Use Topics  . Smoking status: Never Smoker   . Smokeless tobacco: Never Used  . Alcohol Use: 1.2 oz/week    2 Cans of beer per week     Comment: 2 cans beer per day   12/28/2013  DRINKS ONLY  1 CAN A DAY   OB History    No data available     Review of Systems  Constitutional: Negative for fever.  HENT: Negative for congestion.   Eyes: Negative for visual disturbance.  Respiratory: Negative for shortness of breath.   Cardiovascular: Positive for chest pain and near-syncope.  Gastrointestinal: Negative for nausea, vomiting and abdominal pain.  Genitourinary: Negative for dysuria.  Musculoskeletal: Negative for back pain.  Skin: Negative for rash.  Neurological: Positive for light-headedness. Negative for syncope.  Psychiatric/Behavioral: Negative for confusion.      Allergies  Rocephin; Clarithromycin; Cod; Codeine; Darvocet; Demadex; Diovan hct; Eggs or egg-derived products; Ex-lax; Ibuprofen; Indanediones; Indapamide; Morphine and related; Sulfa antibiotics; Benzonatate; Penicillins; and Septra  Home Medications   Prior to Admission medications   Medication Sig  Start Date End Date Taking? Authorizing Provider  amLODipine (NORVASC) 5 MG tablet Take 1 tablet (5 mg total) by mouth daily. 12/30/13   Nino Glow McLean-Scocozza, MD  calcium-vitamin D (OSCAL WITH D) 500-200 MG-UNIT per tablet Take 2 tablets by mouth daily.    Historical Provider, MD  cholecalciferol (VITAMIN D) 1000 UNITS tablet Take 1,000 Units by mouth daily.     Historical Provider, MD  doxycycline (VIBRA-TABS) 100 MG tablet Take 1 tablet (100 mg total) by mouth every 12 (twelve) hours. 12/30/13   Nino Glow McLean-Scocozza, MD  loteprednol (LOTEMAX) 0.5 % ophthalmic suspension Place 1 drop  into both eyes daily as needed (itching).    Historical Provider, MD  metoprolol tartrate (LOPRESSOR) 12.5 mg TABS tablet Take 0.5 tablets (12.5 mg total) by mouth 2 (two) times daily. 08/29/13   Kinnie Feil, MD  ondansetron (ZOFRAN) 4 MG tablet Take 1 tablet (4 mg total) by mouth every 8 (eight) hours as needed for nausea or vomiting. 12/30/13   Nino Glow McLean-Scocozza, MD  Polyethyl Glycol-Propyl Glycol (SYSTANE OP) Place 1 drop into both eyes daily as needed (dry eyes).     Historical Provider, MD  PROAIR HFA 108 (90 BASE) MCG/ACT inhaler Inhale 1 puff into the lungs 2 (two) times daily.  12/13/13   Historical Provider, MD  spiritus frumenti (ETHYL ALCOHOL) SOLN Take 1 each by mouth 2 (two) times daily. Pt drinks budweiser beer twice daily    Historical Provider, MD  telmisartan (MICARDIS) 80 MG tablet Take 80 mg by mouth daily.      Historical Provider, MD   BP 196/71 mmHg  Pulse 63  Temp(Src) 98.5 F (36.9 C) (Oral)  Resp 18  SpO2 99% Physical Exam  Constitutional: She is oriented to person, place, and time. She appears well-developed and well-nourished. No distress.  HENT:  Head: Normocephalic and atraumatic.  Mouth/Throat: Oropharynx is clear and moist.  Eyes: Conjunctivae and EOM are normal. Pupils are equal, round, and reactive to light.  Neck: Normal range of motion. Neck supple.  Cardiovascular: Normal rate, regular rhythm and normal heart sounds.   No murmur heard. Pulmonary/Chest: Effort normal and breath sounds normal. No respiratory distress.  Abdominal: Soft. Bowel sounds are normal. There is no tenderness.  Musculoskeletal: Normal range of motion. She exhibits no edema.  Neurological: She is alert and oriented to person, place, and time. No cranial nerve deficit. She exhibits normal muscle tone. Coordination normal.  Skin: Skin is warm.  Nursing note and vitals reviewed.   ED Course  Procedures (including critical care time) Labs Review Labs Reviewed  BASIC  METABOLIC PANEL - Abnormal; Notable for the following:    Glucose, Bld 122 (*)    BUN 21 (*)    GFR calc non Af Amer 54 (*)    All other components within normal limits  CBC - Abnormal; Notable for the following:    Hemoglobin 11.9 (*)    HCT 35.9 (*)    All other components within normal limits  I-STAT TROPOININ, ED   Results for orders placed or performed during the hospital encounter of [id number]  Basic metabolic panel  Result Value Ref Range   Sodium 136 135 - 145 mmol/L   Potassium 4.8 3.5 - 5.1 mmol/L   Chloride 101 101 - 111 mmol/L   CO2 25 22 - 32 mmol/L   Glucose, Bld 122 (H) 65 - 99 mg/dL   BUN 21 (H) 6 - 20 mg/dL   Creatinine, Ser 0.87  0.44 - 1.00 mg/dL   Calcium 10.0 8.9 - 10.3 mg/dL   GFR calc non Af Amer 54 (L) >60 mL/min   GFR calc Af Amer >60 >60 mL/min   Anion gap 10 5 - 15  CBC  Result Value Ref Range   WBC 8.8 4.0 - 10.5 K/uL   RBC 4.17 3.87 - 5.11 MIL/uL   Hemoglobin 11.9 (L) 12.0 - 15.0 g/dL   HCT 35.9 (L) 36.0 - 46.0 %   MCV 86.1 78.0 - 100.0 fL   MCH 28.5 26.0 - 34.0 pg   MCHC 33.1 30.0 - 36.0 g/dL   RDW 13.5 11.5 - 15.5 %   Platelets 236 150 - 400 K/uL  I-stat troponin, ED (not at [facility], [facility])  Result Value Ref Range   Troponin i, poc 0.01 0.00 - 0.08 ng/mL   Comment 3             Imaging Review Dg Chest 2 View  10/22/2015  CLINICAL DATA:  Midsternal chest pain since this morning. Near syncopal episode. EXAM: CHEST  2 VIEW COMPARISON:  12/28/2013. FINDINGS: The cardiac silhouette, mediastinal and the hilar contours are within normal limits and stable. There is tortuosity and calcification of the thoracic aorta. The lungs are clear. No pleural effusion. Stable eventration of the right hemidiaphragm. Remote healed rib fractures are noted. No acute bony findings. IMPRESSION: No acute cardiopulmonary findings. Electronically Signed   By: Marijo Sanes M.D.   On: 10/22/2015 13:43   I have personally reviewed and evaluated these images and lab results as  part of my medical decision-making.   EKG Interpretation   Date/Time:  Monday October 22 2015 12:53:02 EST Ventricular Rate:  69 PR Interval:  171 QRS Duration: 90 QT Interval:  414 QTC Calculation: 443 R Axis:   66 Text Interpretation:  Sinus rhythm Borderline repolarization abnormality  Baseline wander in lead(s) V3 No significant change since last tracing  Confirmed by Avagail Whittlesey  MD, Khair Chasteen [phone]) on 10/22/2015 12:55:45 PM      MDM   Final diagnoses:  Chest pain, unspecified chest pain type  Essential hypertension  Near syncope    Patient with 3 concerns. The main one is that her blood pressures been running high her doctors been trying to get it under control and she's worried that something bad will happened. Patient states that she had some brief period of midsternal chest pain when she awoke this morning currently does not have any. It was not severe. Patient reported feeling a little bit lightheaded at that time thought maybe she was going to pass out but she did not. Patient's workup here without any acute findings. Troponins negative EKG without any acute changes suggestive of acute cardiac event. Chest x-rays negative for pneumonia pneumothorax or pulmonary edema. Patient's blood pressure is systolic running high around 204 has improved to 196 recently. Explained to patient that gradual control the blood pressure is smart. And not to worry too much about that the actual blood pressure levels but to return for severe chest pain severe headache any stroke symptoms or difficulty breathing. Or if she does pass out. Patient will make an appointment to follow-up with her doctor. Should be noted also patient's troponin was negative.  Patient's physician is trying to get her blood pressure under control appropriately. Patient counseled that the no immediate danger from the high blood pressure in the short run.  Fredia Sorrow, MD 10/22/15 1415

## 2015-10-22 NOTE — ED Notes (Signed)
Pt presents via [facility] from home c /o midsternal CP that began when she woke up this AM.  Pt also reports a near syncopal episode when going to the kitchen table, -LO, reports seeing stars and being lightheaded.  Pt reports starting new medication x 3 days ago for elevated BP.  Pt a x 4, NAD.  EMS gave 324 ASA.  BP-208/83 P-66 O2-98 EKG unremarkable.  HX: afib, IVC filter for DVTS in left leg.

## 2015-10-22 NOTE — ED Notes (Signed)
MD notified of patient BP on discharge. No recommendations at this time.

## 2015-10-22 NOTE — Discharge Instructions (Signed)
Return for any new or worse symptoms. Return for worse chest pain. Return for passing out. Continue to follow-up with your doctors about the better control of the blood pressure.

## 2017-06-07 ENCOUNTER — Encounter (HOSPITAL_COMMUNITY): Payer: Self-pay | Admitting: Emergency Medicine

## 2017-06-07 ENCOUNTER — Emergency Department (HOSPITAL_COMMUNITY): Payer: Medicare Other

## 2017-06-07 ENCOUNTER — Inpatient Hospital Stay (HOSPITAL_COMMUNITY)
Admission: EM | Admit: 2017-06-07 | Discharge: 2017-06-10 | DRG: 309 | Disposition: A | Discharge status: Home Health | Payer: Medicare Other | Attending: Family Medicine | Admitting: Family Medicine

## 2017-06-07 DIAGNOSIS — Z888 Allergy status to other drugs, medicaments and biological substances status: Secondary | ICD-10-CM | POA: Diagnosis not present

## 2017-06-07 DIAGNOSIS — Z9181 History of falling: Secondary | ICD-10-CM

## 2017-06-07 DIAGNOSIS — I5032 Chronic diastolic (congestive) heart failure: Secondary | ICD-10-CM | POA: Diagnosis present

## 2017-06-07 DIAGNOSIS — Z66 Do not resuscitate: Secondary | ICD-10-CM | POA: Diagnosis present

## 2017-06-07 DIAGNOSIS — D649 Anemia, unspecified: Secondary | ICD-10-CM | POA: Diagnosis present

## 2017-06-07 DIAGNOSIS — R778 Other specified abnormalities of plasma proteins: Secondary | ICD-10-CM

## 2017-06-07 DIAGNOSIS — E039 Hypothyroidism, unspecified: Secondary | ICD-10-CM | POA: Diagnosis present

## 2017-06-07 DIAGNOSIS — I214 Non-ST elevation (NSTEMI) myocardial infarction: Secondary | ICD-10-CM

## 2017-06-07 DIAGNOSIS — Z885 Allergy status to narcotic agent status: Secondary | ICD-10-CM | POA: Diagnosis not present

## 2017-06-07 DIAGNOSIS — R748 Abnormal levels of other serum enzymes: Secondary | ICD-10-CM | POA: Diagnosis not present

## 2017-06-07 DIAGNOSIS — R55 Syncope and collapse: Secondary | ICD-10-CM | POA: Diagnosis present

## 2017-06-07 DIAGNOSIS — E871 Hypo-osmolality and hyponatremia: Secondary | ICD-10-CM | POA: Diagnosis present

## 2017-06-07 DIAGNOSIS — Z79899 Other long term (current) drug therapy: Secondary | ICD-10-CM

## 2017-06-07 DIAGNOSIS — Z882 Allergy status to sulfonamides status: Secondary | ICD-10-CM | POA: Diagnosis not present

## 2017-06-07 DIAGNOSIS — N179 Acute kidney failure, unspecified: Secondary | ICD-10-CM | POA: Diagnosis present

## 2017-06-07 DIAGNOSIS — I248 Other forms of acute ischemic heart disease: Secondary | ICD-10-CM | POA: Diagnosis present

## 2017-06-07 DIAGNOSIS — Z880 Allergy status to penicillin: Secondary | ICD-10-CM | POA: Diagnosis not present

## 2017-06-07 DIAGNOSIS — Z86718 Personal history of other venous thrombosis and embolism: Secondary | ICD-10-CM | POA: Diagnosis not present

## 2017-06-07 DIAGNOSIS — K219 Gastro-esophageal reflux disease without esophagitis: Secondary | ICD-10-CM | POA: Diagnosis present

## 2017-06-07 DIAGNOSIS — R7989 Other specified abnormal findings of blood chemistry: Secondary | ICD-10-CM

## 2017-06-07 DIAGNOSIS — Z881 Allergy status to other antibiotic agents status: Secondary | ICD-10-CM | POA: Diagnosis not present

## 2017-06-07 DIAGNOSIS — I110 Hypertensive heart disease with heart failure: Secondary | ICD-10-CM | POA: Diagnosis present

## 2017-06-07 DIAGNOSIS — E860 Dehydration: Secondary | ICD-10-CM | POA: Diagnosis present

## 2017-06-07 DIAGNOSIS — I4891 Unspecified atrial fibrillation: Principal | ICD-10-CM | POA: Diagnosis present

## 2017-06-07 DIAGNOSIS — R531 Weakness: Secondary | ICD-10-CM | POA: Diagnosis not present

## 2017-06-07 DIAGNOSIS — R079 Chest pain, unspecified: Secondary | ICD-10-CM | POA: Diagnosis present

## 2017-06-07 LAB — CBC
HCT: 34.4 % — ABNORMAL LOW (ref 36.0–46.0)
Hemoglobin: 11 g/dL — ABNORMAL LOW (ref 12.0–15.0)
MCH: 27.6 pg (ref 26.0–34.0)
MCHC: 32 g/dL (ref 30.0–36.0)
MCV: 86.4 fL (ref 78.0–100.0)
PLATELETS: 310 10*3/uL (ref 150–400)
RBC: 3.98 MIL/uL (ref 3.87–5.11)
RDW: 13.9 % (ref 11.5–15.5)
WBC: 14.4 10*3/uL — AB (ref 4.0–10.5)

## 2017-06-07 LAB — BASIC METABOLIC PANEL
ANION GAP: 14 (ref 5–15)
BUN: 22 mg/dL — ABNORMAL HIGH (ref 6–20)
CALCIUM: 9.6 mg/dL (ref 8.9–10.3)
CO2: 20 mmol/L — AB (ref 22–32)
Chloride: 98 mmol/L — ABNORMAL LOW (ref 101–111)
Creatinine, Ser: 1.38 mg/dL — ABNORMAL HIGH (ref 0.44–1.00)
GFR, EST AFRICAN AMERICAN: 35 mL/min — AB (ref >60)
GFR, EST NON AFRICAN AMERICAN: 30 mL/min — AB (ref >60)
Glucose, Bld: 109 mg/dL — ABNORMAL HIGH (ref 65–99)
POTASSIUM: 4.4 mmol/L (ref 3.5–5.1)
Sodium: 132 mmol/L — ABNORMAL LOW (ref 135–145)

## 2017-06-07 LAB — BRAIN NATRIURETIC PEPTIDE: B NATRIURETIC PEPTIDE 5: 475.4 pg/mL — AB (ref 0.0–100.0)

## 2017-06-07 LAB — TSH: TSH: 10.101 u[IU]/mL — AB (ref 0.350–4.500)

## 2017-06-07 LAB — MAGNESIUM: Magnesium: 1.8 mg/dL (ref 1.7–2.4)

## 2017-06-07 LAB — TROPONIN I: Troponin I: 0.04 ng/mL (ref <0.03)

## 2017-06-07 MED ORDER — LEVOTHYROXINE SODIUM 88 MCG PO TABS
44.0000 ug | ORAL_TABLET | Freq: Every day | ORAL | Status: DC
  Administered 2017-06-08 – 2017-06-10 (×3): 44 ug via ORAL
  Filled 2017-06-07 (×3): qty 1

## 2017-06-07 MED ORDER — METOPROLOL TARTRATE 25 MG PO TABS
12.5000 mg | ORAL_TABLET | Freq: Two times a day (BID) | ORAL | Status: DC
  Administered 2017-06-08 – 2017-06-10 (×6): 12.5 mg via ORAL
  Filled 2017-06-07 (×6): qty 1

## 2017-06-07 MED ORDER — AMLODIPINE BESYLATE 5 MG PO TABS
5.0000 mg | ORAL_TABLET | Freq: Every day | ORAL | Status: DC
  Administered 2017-06-08: 5 mg via ORAL
  Filled 2017-06-07: qty 1

## 2017-06-07 MED ORDER — ASPIRIN EC 81 MG PO TBEC
81.0000 mg | DELAYED_RELEASE_TABLET | Freq: Every day | ORAL | Status: DC
  Administered 2017-06-08 – 2017-06-10 (×3): 81 mg via ORAL
  Filled 2017-06-07 (×3): qty 1

## 2017-06-07 MED ORDER — SACUBITRIL-VALSARTAN 49-51 MG PO TABS
0.5000 | ORAL_TABLET | ORAL | Status: DC
Start: 2017-06-07 — End: 2017-06-08

## 2017-06-07 MED ORDER — METOPROLOL TARTRATE 5 MG/5ML IV SOLN
5.0000 mg | INTRAVENOUS | Status: DC | PRN
  Administered 2017-06-07: 5 mg via INTRAVENOUS
  Filled 2017-06-07: qty 5

## 2017-06-07 MED ORDER — SODIUM CHLORIDE 0.9 % IV SOLN: INTRAVENOUS | Status: DC

## 2017-06-07 MED ORDER — ENOXAPARIN SODIUM 40 MG/0.4ML ~~LOC~~ SOLN: 40.0000 mg | Freq: Every day | SUBCUTANEOUS | Status: DC

## 2017-06-07 MED ORDER — ACETAMINOPHEN 325 MG PO TABS: 650.0000 mg | ORAL_TABLET | Freq: Four times a day (QID) | ORAL | Status: DC | PRN

## 2017-06-07 MED ORDER — ONDANSETRON HCL 4 MG/2ML IJ SOLN
4.0000 mg | Freq: Once | INTRAMUSCULAR | Status: AC
  Administered 2017-06-07: 4 mg via INTRAVENOUS
  Filled 2017-06-07: qty 2

## 2017-06-07 MED ORDER — ACETAMINOPHEN 650 MG RE SUPP: 650.0000 mg | Freq: Four times a day (QID) | RECTAL | Status: DC | PRN

## 2017-06-07 NOTE — ED Notes (Signed)
Radiology at bedside

## 2017-06-07 NOTE — ED Notes (Signed)
ED Provider at bedside. 

## 2017-06-07 NOTE — ED Triage Notes (Signed)
Pt arrives via [facility] from home where she lives alone, pt reports she woke up at 0900 this morning feeling dizzy, hx of afib, pt afib with rvr on ems monitor. Pt was found by family this afternoon, altered and with vomit on her clothes, ems reports pt was a/ox4 upon their arrival, attempted po zofran but pt vomited med up. Pt a/ox4 upon arrival to ED. Denies cp at this time.

## 2017-06-07 NOTE — ED Provider Notes (Signed)
[place] DEPT Provider Note   CSN: [id number] Arrival date & time: 06/07/17  1635     History   Chief Complaint Chief Complaint  Patient presents with  . Atrial Fibrillation    rvr  . Dizziness    HPI Shirley Strickland is a 83 y.o. female.  HPI  This elderly female presents via EMS due to concern of chest pain, nausea, vomiting, palpitations. Patient has very poor hearing, but notes that she was well yesterday, but upon awakening today, approximately 9 hours ago she felt chest pain, anterior, nonradiating, sore. There was associated palpitations. Symptoms lastedseveral hours, eased without clear intervention. Patient has had no vomiting since this morning. No fever, no chills. She notes that no medications have been changed recently.   Past Medical History:  Diagnosis Date  . A-fib ([place])   . 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 Bruise    bruise on left foot from fall 07/03/11  . Cancer [facility])    pt states only skin cancer  . CHF (congestive heart failure) ([place])   . DVT (deep venous thrombosis) ([place]) 12/28/2013   LEFT   . Fall   . GERD (gastroesophageal reflux disease)   . HTN (hypertension)   . Knee pain, acute 07/04/11   Pt states left knee twisted under her in fall 07/03/11 and now painful  . Numbness of legs    started after fall yesterday 07/03/11  . Osteopenia   . Pneumonia   . Rash and nonspecific skin eruption 07/04/11   rash on bilateral upper legs  . Syncope     Patient Active Problem List   Diagnosis Date Noted  . DVT (deep venous thrombosis)-left 12/28/2013  . Acute deep vein thrombosis (DVT) of femoral vein of left lower extremity ([place]) 09/29/2013  . Anemia 09/29/2013  . Cellulitis 09/23/2013  . Hyponatremia 08/31/2013  . UTI (urinary tract infection) 08/31/2013  . Chronic diastolic heart failure ([place]) 08/25/2013  . Left fibular fracture 08/24/2013  . Laceration of left leg 08/23/2013  . Syncope and collapse 08/23/2013  . HTN  (hypertension) 08/23/2013  . Fall at home 08/23/2013  . Sacral insufficiency fracture 07/07/2011  . Fall   . A-fib [facility])     Past Surgical History:  Procedure Laterality Date  . APPENDECTOMY    . CATARACT EXTRACTION    . ELBOW SURGERY     right  . EXPLORATORY LAPAROTOMY  07/04/11   years ago to find cause of bleeding    OB History    No data available       Home Medications    Prior to Admission medications   Medication Sig Start Date End Date Taking? Authorizing Provider  aspirin EC 81 MG tablet Take 81 mg by mouth daily.   Yes [provider]  ENTRESTO 49-51 MG Take 0.5-1 tablets by mouth See admin instructions. One tablet in the morning and Half tablet in the evening. 05/15/17  Yes [provider]  levothyroxine (SYNTHROID, LEVOTHROID) 88 MCG tablet Take 44 mcg by mouth every morning. 05/19/17  Yes [provider]  amLODipine (NORVASC) 5 MG tablet Take 1 tablet (5 mg total) by mouth daily. Patient not taking: Reported on 06/07/2017 12/30/13   McLean-Scocuzza, Nino Glow, MD  metoprolol tartrate (LOPRESSOR) 12.5 mg TABS tablet Take 0.5 tablets (12.5 mg total) by mouth 2 (two) times daily. Patient not taking: Reported on 06/07/2017 08/29/13   Kinnie Feil, MD    Family History No family history  on file.  Social History Social History  Substance Use Topics  . Smoking status: Never Smoker  . Smokeless tobacco: Never Used  . Alcohol use 1.2 oz/week    2 Cans of beer per week     Comment: 2 cans beer per day   12/28/2013  DRINKS ONLY  1 CAN A DAY     Allergies   Rocephin [ceftriaxone sodium in dextrose]; Clarithromycin; [place] [fish allergy]; Codeine; Darvocet [propoxyphene n-acetaminophen]; Demadex [torsemide]; Diovan hct [valsartan-hydrochlorothiazide]; Eggs or egg-derived products; Ex-lax [senna]; Ibuprofen; Indanediones; Indapamide; Morphine and related; Sulfa antibiotics; Benzonatate; Penicillins; and Septra [bactrim]   Review of  Systems Review of Systems  Constitutional:       Per HPI, otherwise negative  HENT:       Per HPI, otherwise negative  Respiratory:       Per HPI, otherwise negative  Cardiovascular:       Per HPI, otherwise negative  Gastrointestinal: Positive for nausea and vomiting. Negative for abdominal pain.  Endocrine:       Negative aside from HPI  Genitourinary:       Neg aside from HPI   Musculoskeletal:       Per HPI, otherwise negative  Skin: Negative.   Neurological: Positive for weakness. Negative for syncope.     Physical Exam Updated Vital Signs BP 123/71   Pulse 81   Temp (!) 97.4 F (36.3 C) (Oral)   Resp 18   SpO2 100%   Physical Exam  Constitutional: She is oriented to person, place, and time. She appears well-developed and well-nourished. No distress.  HENT:  Head: Normocephalic and atraumatic.  Eyes: Conjunctivae and EOM are normal.  Cardiovascular: A regularly irregular rhythm present. Tachycardia present.   Pulmonary/Chest: Effort normal and breath sounds normal. No stridor. No respiratory distress.  Abdominal: She exhibits no distension.  Musculoskeletal: She exhibits no edema.  Neurological: She is alert and oriented to person, place, and time. She displays atrophy and tremor. No cranial nerve deficit. She exhibits normal muscle tone. She displays no seizure activity.  Extremely poor hearing otherwiseneurologic exam reassuring  Skin: Skin is warm and dry.  Psychiatric: She has a normal mood and affect.  Nursing note and vitals reviewed.    ED Treatments / Results  Labs (all labs ordered are listed, but only abnormal results are displayed) Labs Reviewed  BASIC METABOLIC PANEL - Abnormal; Notable for the following:       Result Value   Sodium 132 (*)    Chloride 98 (*)    CO2 20 (*)    Glucose, Bld 109 (*)    BUN 22 (*)    Creatinine, Ser 1.38 (*)    GFR calc non Af Amer 30 (*)    GFR calc Af Amer 35 (*)    All other components within normal limits   CBC - Abnormal; Notable for the following:    WBC 14.4 (*)    Hemoglobin 11.0 (*)    HCT 34.4 (*)    All other components within normal limits  TSH - Abnormal; Notable for the following:    TSH 10.101 (*)    All other components within normal limits  TROPONIN I - Abnormal; Notable for the following:    Troponin I 0.04 (*)    All other components within normal limits  BRAIN NATRIURETIC PEPTIDE - Abnormal; Notable for the following:    B Natriuretic Peptide 475.4 (*)    All other components within normal limits  MAGNESIUM  TROPONIN I  TROPONIN I  TROPONIN I  COMPREHENSIVE METABOLIC PANEL  CBC  D-DIMER, QUANTITATIVE (NOT AT [facility])    I reviewed the EMS rhythm strip, notable for atrial fibrillation with rapid ventricular response, rate in the 140/150 range.  EKG  EKG Interpretation  Date/Time:  Sunday June 07 2017 17:49:54 EDT Ventricular Rate:  86 PR Interval:    QRS Duration: 97 QT Interval:  390 QTC Calculation: 467 R Axis:   65 Text Interpretation:  Accelerated junctional rhythm Low voltage, precordial leads Artifact Abnormal ekg Confirmed by Carmin Muskrat [phone]) on 06/07/2017 6:46:00 PM       Radiology Dg Chest Port 1 View  Result Date: 06/07/2017 CLINICAL DATA:  Onset of dizziness this morning. EXAM: PORTABLE CHEST 1 VIEW COMPARISON:  PA and lateral chest 10/22/2015. FINDINGS: Mild left basilar atelectasis is seen. The lungs are otherwise clear. Heart size is upper normal. No pneumothorax or pleural effusion. Aortic atherosclerosis noted. No acute bony abnormality. IMPRESSION: No acute disease. Electronically Signed   By: Inge Rise M.D.   On: 06/07/2017 17:13    Procedures Procedures (including critical care time)  Medications Ordered in ED Medications  metoprolol tartrate (LOPRESSOR) injection 5 mg (5 mg Intravenous Given 06/07/17 1706)  metoprolol tartrate (LOPRESSOR) tablet 12.5 mg (not administered)  amLODipine (NORVASC) tablet 5 mg (not  administered)  levothyroxine (SYNTHROID, LEVOTHROID) tablet 44 mcg (not administered)  aspirin EC tablet 81 mg (not administered)  enoxaparin (LOVENOX) injection 40 mg (not administered)  acetaminophen (TYLENOL) tablet 650 mg (not administered)    Or  acetaminophen (TYLENOL) suppository 650 mg (not administered)  sacubitril-valsartan (ENTRESTO) 49-51 mg per tablet (not administered)    And  sacubitril-valsartan (ENTRESTO) 24-26 mg per tablet (not administered)  ondansetron (ZOFRAN) injection 4 mg (4 mg Intravenous Given 06/07/17 1750)   This patients CHA2DS2-VASc Score and unadjusted Ischemic Stroke Rate (% per year) is equal to 4.8 % stroke rate/year from a score of 4  Above score calculated as 1 point each if present [CHF, HTN, DM, Vascular=MI/PAD/Aortic Plaque, Age if 65-74, or Female] Above score calculated as 2 points each if present [Age > 75, or Stroke/TIA/TE]    Initial Impression / Assessment and Plan / ED Course  I have reviewed the triage vital signs and the nursing notes.  Pertinent labs & imaging results that were available during my care of the patient were reviewed by me and considered in my medical decision making (see chart for details).     Update Patient has had cardioversion after provision of IV beta blocker. Heart rate 80s, junctional versus sinus with low amplitude.  7:36 PM Family members now present. Note the patient had an episode of syncope, just prior to calling EMS. They note that the patient was well until today. I reviewed all findings this far, including slight elevation in creatinine, elevated TSH, elevated troponin, and the family states the patient has previously received medication for thyroid disease, but they are unsure of dosing. Patient continues to feel poorly, with generalized weakness, but no additional episodes of syncope. Given the patient's episode of syncope, with atrial fibrillation, rapid ventricular response, and concern for  hypothyroidism, versus cardio myopathy, or possible dehydration is contributing to her episode, the patientwill require admission for further evaluation and management.  I discussed patient's case with our cardiology colleagues, and they will follow as a consulting service. I discussed patient's case with our internal medicine team  Final Clinical Impressions(s) / ED Diagnoses   Final diagnoses:  Atrial fibrillation with rapid ventricular response (HCC)  Hypothyroidism, unspecified type  Elevated troponin   CRITICAL CARE Performed by: Carmin Muskrat Total critical care time: 40 minutes Critical care time was exclusive of separately billable procedures and treating other patients. Critical care was necessary to treat or prevent imminent or life-threatening deterioration. Critical care was time spent personally by me on the following activities: development of treatment plan with patient and/or surrogate as well as nursing, discussions with consultants, evaluation of patient's response to treatment, examination of patient, obtaining history from patient or surrogate, ordering and performing treatments and interventions, ordering and review of laboratory studies, ordering and review of radiographic studies, pulse oximetry and re-evaluation of patient's condition.    Carmin Muskrat, MD 06/08/17 Berniece Salines

## 2017-06-07 NOTE — ED Notes (Signed)
[place] @ [phone]

## 2017-06-07 NOTE — H&P (Signed)
[facility] Admission History and Physical Service Pager: [phone]  Patient name: Shirley Strickland Medical record number: [id number] Date of birth: 1916/01/22 Age: 83 y.o. Gender: female  Primary Care Provider: Alvester Chou, NP Consultants: cardio Code Status: DNR  Chief Complaint: syncopal symptoms  Assessment and Plan: Shirley Strickland is a 83 y.o. female presenting with possible syncopal vs AMS via EMS from home . PMH is significant for HTN, Afib, Anemia, DVT x3 , IVC filter in  2015, HFpEF   Possible syncopal episode vs hypoxia: Most likely etiology is arrythmia, Patient presented AFIB w/ rvr that resolved with IV metoprolol.  Also a possibility for TIA given hx of DVT, memory gaps, not on any anti-coagulation for AFib, thought no focal neurologic findings.  PE also a potential although made slightly less likely due to IVC filter.  ACS less likely due to .04 troponins, EKG without ST changes on repeat once afib well controlled. However patient does endorse chest pain midsternal to left side, and this could be due to Afib with RVR.  Substance contribution less likely as patient denies illicit use and only drinks 1 beer per day.  Infectious less likely by no prior sick symptoms noted and patient is afebrile, CXR without signs of infection. Patient with slight leukocytosis likely due to stress from Afib with RVR -admit to tele, Dr. McDiarmid -Trend troponin x3 and AM EKG   -d-dimer -cardio consulted -patient back to baseline so utility of cerebral imagine can be discussed  Afib w/ RVR: ~140s on admission, resolved with IV metoprolol push. CHADVASC of 7 and HAS-BLEED of 3. As patient had a hx of falls in the past anticoagulation was held  -home metoprolol as tolerated by BP -Cardiology consulted   AKI: SCR 1.38, baseline around 0.87  - Will continue to trend SCR  - Obtain UA - check for protein/hemaglobin cast  - Hold entresto  HFpEF - last echo 08/2013.  60-65%,  G1DD.  BNP 475 ( no previous baseline determined). No signs of volume overload on exam, patient indicates slight dyspnea early today compared to her normal- could be due to arrhthymias, do not think patient if fluid overloaded   -cardio consulted -Would consider adding an ECHO -home metoprolol as BP allows -Hold Entresto due to AKI  Lower leg skin grafts: 83yrs old, increased pain per patient but exam looks better than past per patient - no new intervention  L femoral DVT- documented in chart, IVC filter, not currently complaining of pain. Home med is baby aspirin due to charted hx of decreasing hemoglobin on lovenox, thought not noted as an allergy  -continue home aspirin 81mg   HTN: Norm-hypertensive  -Continue Norvasc and Lopressor  - Hold entrestro  FEN/GI: Normal diet Prophylaxis:Lovenox   Disposition: to med-surg  History of Present Illness:  Shirley Strickland is a 83 y.o. female presenting with chest pain and possibly syncopal episode. Patient was well earlier in the morning.  Then between 62- 2PM, patient states that she was sitting in the chair feeling diaphoretic, chest pain and weakness. Niece states she called and could not get patient on the phone. She asked the neighbor to go to house and she found her disorientated, weak,and vomited on herself. Patient states she was having left sided chest pressure, did not radiate anywhere. She felt weak and  Knew she could not get up.. Patient initial she did not recognize her neighbor. Therefore they called 911.EMS came patient was noted to be tachycardic into  the 150s.a place oxygen on patient patient felt like her confusion cleared almost immediately. She was taken to the ED and was found to have A. Fib with RVR patient received IV dose of metoprolol 5 mg, rate and rhythm then came back into control. Denies any recent episodes of chest pain. Patient does endorses some dyspnea which she did not have prior to event. Does endorse hx of several blood  clots, and had IVC filtered placed in 2015. Patient denies any significant cough or sputum production recently, does not some mild cough thought nothing unusually for her. Denies any fevers.  Patient was recently seen by home doctor this week with completely normal vitals.  Denies any current chest pain, no numbness or tingling, focal weakness   Review Of Systems: Per HPI with the following additions:   Review of Systems  Constitutional: Negative for chills and fever.  HENT: Negative for congestion.   Eyes: Negative for blurred vision.  Respiratory: Positive for shortness of breath. Negative for cough and sputum production.   Cardiovascular: Positive for chest pain and palpitations.  Gastrointestinal: Positive for constipation, nausea and vomiting.  Genitourinary: Negative for dysuria and urgency.  Neurological: Positive for dizziness. Negative for tingling, focal weakness and headaches.    Patient Active Problem List   Diagnosis Date Noted  . Chest pain 06/07/2017  . DVT (deep venous thrombosis)-left 12/28/2013  . Acute deep vein thrombosis (DVT) of femoral vein of left lower extremity ([employer]) 09/29/2013  . Anemia 09/29/2013  . Cellulitis 09/23/2013  . Hyponatremia 08/31/2013  . UTI (urinary tract infection) 08/31/2013  . Chronic diastolic heart failure ([place]) 08/25/2013  . Left fibular fracture 08/24/2013  . Laceration of left leg 08/23/2013  . Syncope and collapse 08/23/2013  . HTN (hypertension) 08/23/2013  . Fall at home 08/23/2013  . Sacral insufficiency fracture 07/07/2011  . Fall   . A-fib [facility])     Past Medical History: Past Medical History:  Diagnosis Date  . A-fib ([place])   . 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 Bruise    bruise on left foot from fall 07/03/11  . Cancer [facility])    pt states only skin cancer  . CHF (congestive heart failure) ([place])   . DVT (deep venous thrombosis) ([place]) 12/28/2013   LEFT   . Fall   . GERD (gastroesophageal reflux disease)   .  HTN (hypertension)   . Knee pain, acute 07/04/11   Pt states left knee twisted under her in fall 07/03/11 and now painful  . Numbness of legs    started after fall yesterday 07/03/11  . Osteopenia   . Pneumonia   . Rash and nonspecific skin eruption 07/04/11   rash on bilateral upper legs  . Syncope     Past Surgical History: Past Surgical History:  Procedure Laterality Date  . APPENDECTOMY    . CATARACT EXTRACTION    . ELBOW SURGERY     right  . EXPLORATORY LAPAROTOMY  07/04/11   years ago to find cause of bleeding    Social History: Social History  Substance Use Topics  . Smoking status: Never Smoker  . Smokeless tobacco: Never Used  . Alcohol use 1.2 oz/week    2 Cans of beer per week     Comment: 2 cans beer per day   12/28/2013  DRINKS ONLY  1 CAN A DAY   Additional social history: lives alone with family members checking in on her  Please also refer to relevant  sections of EMR.  Family History: No family history on file. (If not completed, MUST add something in)  Allergies and Medications: Allergies  Allergen Reactions  . Rocephin [Ceftriaxone Sodium In Dextrose]     Unable to breath  . Clarithromycin Nausea Only  . [employer] [facility]] Other (See Comments)    Stomach pains and passed out recently - doesn't remember reaction before this weekend  . Codeine Nausea And Vomiting  . Darvocet [Propoxyphene N-Acetaminophen] Nausea And Vomiting  . Demadex [Torsemide] Hives and Nausea And Vomiting    UNKNOWN  . Diovan Hct [Valsartan-Hydrochlorothiazide] Other (See Comments)    Nausea, sick, hives  06-07-17 PATIENT IS CURRENTLY ON THIS MEDICATION, NO SIDE EFFECTS  . Eggs Or Egg-Derived Products Nausea And Vomiting  . Ex-Lax [Senna] Other (See Comments)    unknown  . Ibuprofen Nausea Only  . Indanediones Other (See Comments)    unknown  . Indapamide Other (See Comments)    unknown  . Morphine And Related Nausea And Vomiting  . Sulfa Antibiotics Other (See Comments)     Couldn't breathe pt states that she was in a coma foe 3 weeks   . Benzonatate Itching and Rash    Eyes swelled  . Penicillins Rash  . Septra [Bactrim] Rash   No current facility-administered medications on file prior to encounter.    Current Outpatient Prescriptions on File Prior to Encounter  Medication Sig Dispense Refill  . amLODipine (NORVASC) 5 MG tablet Take 1 tablet (5 mg total) by mouth daily. (Patient not taking: Reported on 06/07/2017) 30 tablet 1  . metoprolol tartrate (LOPRESSOR) 12.5 mg TABS tablet Take 0.5 tablets (12.5 mg total) by mouth 2 (two) times daily. (Patient not taking: Reported on 06/07/2017)      Objective: BP 118/69   Pulse 69   Temp (!) 97.4 F (36.3 C) (Oral)   Resp 20   SpO2 98%  Exam: General: no acute distress, patient conversation but very hard of hearing Eyes: EOMI, PERLLA ENTM: no lymphadenopathy, oral mucosa wihout noted lesions Neck: FROM Cardiovascular: RRR, no murmurs noted, no edema in LE Respiratory: upper respiratory wheezing sound on auscultation and to audible to room, visible IWB when sitting up Gastrointestinal: bowel sounds in all 4 qudrants, no masses ntoed MSK: patient stated that UE were at baseline 4/5 but affirming given age, LE was weaker per patient but still 4/5.  Exam of LE strength made difficult by patient's painful grafts Derm: extensive skin grafts on LE Neuro: no focal deficits noted on CN exam Psych: appropriate, pleasant   Labs and Imaging: CBC BMET   Recent Labs Lab 06/07/17 1657  WBC 14.4*  HGB 11.0*  HCT 34.4*  PLT 310    Recent Labs Lab 06/07/17 1657  NA 132*  K 4.4  CL 98*  CO2 20*  BUN 22*  CREATININE 1.38*  GLUCOSE 109*  CALCIUM 9.6     Dg Chest Port 1 View  Result Date: 06/07/2017 CLINICAL DATA:  Onset of dizziness this morning. EXAM: PORTABLE CHEST 1 VIEW COMPARISON:  PA and lateral chest 10/22/2015. FINDINGS: Mild left basilar atelectasis is seen. The lungs are otherwise clear.  Heart size is upper normal. No pneumothorax or pleural effusion. Aortic atherosclerosis noted. No acute bony abnormality. IMPRESSION: No acute disease. Electronically Signed   By: Inge Rise M.D.   On: 06/07/2017 17:13     Sherene Sires, DO 06/07/2017, 11:42 PM PGY-1, [place] Intern pager: [phone], text pages welcome  UPPER LEVEL ADDENDUM  I have read the above note and made revisions highlighted in blue.  Kerrin Mo, MD, PGY-3 [facility]

## 2017-06-07 NOTE — ED Notes (Signed)
Pt in regular cardiac rhythm on monitor with rate of 86, EKG captured and given to Dr. Vanita Panda

## 2017-06-07 NOTE — ED Notes (Signed)
Pt requesting water and something to eat. Dr Vanita Panda advised pt may have food/drink at this time

## 2017-06-08 DIAGNOSIS — I248 Other forms of acute ischemic heart disease: Secondary | ICD-10-CM | POA: Diagnosis present

## 2017-06-08 DIAGNOSIS — I4891 Unspecified atrial fibrillation: Principal | ICD-10-CM

## 2017-06-08 DIAGNOSIS — R55 Syncope and collapse: Secondary | ICD-10-CM

## 2017-06-08 LAB — CK TOTAL AND CKMB (NOT AT ARMC)
CK, MB: 14.5 ng/mL — ABNORMAL HIGH (ref 0.5–5.0)
Relative Index: 10.7 — ABNORMAL HIGH (ref 0.0–2.5)
Total CK: 136 U/L (ref 38–234)

## 2017-06-08 LAB — COMPREHENSIVE METABOLIC PANEL
ALK PHOS: 54 U/L (ref 38–126)
ALT: 11 U/L — AB (ref 14–54)
AST: 28 U/L (ref 15–41)
Albumin: 3.2 g/dL — ABNORMAL LOW (ref 3.5–5.0)
Anion gap: 10 (ref 5–15)
BUN: 28 mg/dL — AB (ref 6–20)
CALCIUM: 9.3 mg/dL (ref 8.9–10.3)
CO2: 24 mmol/L (ref 22–32)
CREATININE: 1.46 mg/dL — AB (ref 0.44–1.00)
Chloride: 98 mmol/L — ABNORMAL LOW (ref 101–111)
GFR, EST AFRICAN AMERICAN: 33 mL/min — AB (ref >60)
GFR, EST NON AFRICAN AMERICAN: 28 mL/min — AB (ref >60)
Glucose, Bld: 114 mg/dL — ABNORMAL HIGH (ref 65–99)
Potassium: 5.1 mmol/L (ref 3.5–5.1)
SODIUM: 132 mmol/L — AB (ref 135–145)
Total Bilirubin: 0.7 mg/dL (ref 0.3–1.2)
Total Protein: 5.6 g/dL — ABNORMAL LOW (ref 6.5–8.1)

## 2017-06-08 LAB — CBC
HCT: 29.1 % — ABNORMAL LOW (ref 36.0–46.0)
Hemoglobin: 9.3 g/dL — ABNORMAL LOW (ref 12.0–15.0)
MCH: 27.5 pg (ref 26.0–34.0)
MCHC: 32 g/dL (ref 30.0–36.0)
MCV: 86.1 fL (ref 78.0–100.0)
PLATELETS: 217 10*3/uL (ref 150–400)
RBC: 3.38 MIL/uL — AB (ref 3.87–5.11)
RDW: 14 % (ref 11.5–15.5)
WBC: 12.1 10*3/uL — ABNORMAL HIGH (ref 4.0–10.5)

## 2017-06-08 LAB — TROPONIN I
Troponin I: 2.88 ng/mL (ref <0.03)
Troponin I: 2.95 ng/mL (ref <0.03)
Troponin I: 3.47 ng/mL (ref <0.03)
Troponin I: 3.87 ng/mL (ref <0.03)

## 2017-06-08 LAB — D-DIMER, QUANTITATIVE (NOT AT ARMC): D DIMER QUANT: 0.55 ug{FEU}/mL — AB (ref 0.00–0.50)

## 2017-06-08 MED ORDER — ENOXAPARIN SODIUM 80 MG/0.8ML ~~LOC~~ SOLN
70.0000 mg | SUBCUTANEOUS | Status: DC
  Administered 2017-06-08: 06:00:00 70 mg via SUBCUTANEOUS
  Filled 2017-06-08: qty 0.8

## 2017-06-08 MED ORDER — LORAZEPAM 1 MG PO TABS: 1.0000 mg | ORAL_TABLET | Freq: Four times a day (QID) | ORAL | Status: DC | PRN

## 2017-06-08 MED ORDER — VITAMIN B-1 100 MG PO TABS
100.0000 mg | ORAL_TABLET | Freq: Every day | ORAL | Status: DC
  Administered 2017-06-08 – 2017-06-10 (×3): 100 mg via ORAL
  Filled 2017-06-08 (×3): qty 1

## 2017-06-08 MED ORDER — ENOXAPARIN SODIUM 30 MG/0.3ML ~~LOC~~ SOLN
30.0000 mg | SUBCUTANEOUS | Status: DC
  Administered 2017-06-09: 30 mg via SUBCUTANEOUS
  Filled 2017-06-08: qty 0.3

## 2017-06-08 MED ORDER — ADULT MULTIVITAMIN W/MINERALS CH
1.0000 | ORAL_TABLET | Freq: Every day | ORAL | Status: DC
  Administered 2017-06-08 – 2017-06-10 (×3): 1 via ORAL
  Filled 2017-06-08 (×3): qty 1

## 2017-06-08 MED ORDER — SACUBITRIL-VALSARTAN 24-26 MG PO TABS
1.0000 | ORAL_TABLET | Freq: Every day | ORAL | Status: DC
  Administered 2017-06-08: 1 via ORAL
  Filled 2017-06-08: qty 1

## 2017-06-08 MED ORDER — FOLIC ACID 1 MG PO TABS
1.0000 mg | ORAL_TABLET | Freq: Every day | ORAL | Status: DC
  Administered 2017-06-08 – 2017-06-10 (×3): 1 mg via ORAL
  Filled 2017-06-08 (×3): qty 1

## 2017-06-08 MED ORDER — SACUBITRIL-VALSARTAN 49-51 MG PO TABS
1.0000 | ORAL_TABLET | Freq: Every day | ORAL | Status: DC
  Filled 2017-06-08: qty 1

## 2017-06-08 MED ORDER — LORAZEPAM 2 MG/ML IJ SOLN: 1.0000 mg | Freq: Four times a day (QID) | INTRAMUSCULAR | Status: DC | PRN

## 2017-06-08 MED ORDER — THIAMINE HCL 100 MG/ML IJ SOLN: 100.0000 mg | Freq: Every day | INTRAMUSCULAR | Status: DC

## 2017-06-08 MED ORDER — KETOROLAC TROMETHAMINE 15 MG/ML IJ SOLN: 15.0000 mg | Freq: Four times a day (QID) | INTRAMUSCULAR | Status: DC | PRN

## 2017-06-08 NOTE — Progress Notes (Signed)
ANTICOAGULATION CONSULT NOTE - Initial Consult  Pharmacy Consult for Lovenox  Indication: Rule out DVT/PE  Patient Measurements: Height: 5\' 3"  (160 cm) Weight: 148 lb (67.1 kg) IBW/kg (Calculated) : 52.4  Vital Signs: Temp: 98.9 F (37.2 C) (10/15 0021) Temp Source: Oral (10/15 0021) BP: 157/55 (10/15 0021) Pulse Rate: 79 (10/15 0021)  Labs:  Recent Labs  06/07/17 1657 06/07/17 2339  HGB 11.0*  --   HCT 34.4*  --   PLT 310  --   CREATININE 1.38*  --   TROPONINI 0.04* 2.95*    Estimated Creatinine Clearance: 20.4 mL/min (A) (by C-G formula based on SCr of 1.38 mg/dL (H)).   Medical History: Past Medical History:  Diagnosis Date  . A-fib ([place])   . 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 Bruise    bruise on left foot from fall 07/03/11  . Cancer [facility])    pt states only skin cancer  . CHF (congestive heart failure) ([place])   . DVT (deep venous thrombosis) ([place]) 12/28/2013   LEFT   . Fall   . GERD (gastroesophageal reflux disease)   . HTN (hypertension)   . Knee pain, acute 07/04/11   Pt states left knee twisted under her in fall 07/03/11 and now painful  . Numbness of legs    started after fall yesterday 07/03/11  . Osteopenia   . Pneumonia   . Rash and nonspecific skin eruption 07/04/11   rash on bilateral upper legs  . Syncope     Assessment: 83 y/o F here with dizziness, hx afib/DVT, no anti-coagulation PTA, D-dimer elevated, troponin elevated, concern for DVT/PE, starting renally adjusted Lovenox, Hgb 11, CrCl ~20.   Goal of Therapy:  Monitor platelets by anticoagulation protocol: Yes   Plan:  -Lovenox 70 mg subcutaneous q24h -Daily CBC/HL -Monitor for bleeding -F/U VTE work-up  Narda Bonds 06/08/2017,3:47 AM

## 2017-06-08 NOTE — Progress Notes (Signed)
FPTS Interim Progress Note  S:paged about elevated troponin by nursing  O: BP (!) 136/47 (BP Location: Left Arm)   Pulse 69   Temp 98.7 F (37.1 C) (Oral)   Resp 18   Ht 5\' 3"  (1.6 m)   Wt 148 lb (67.1 kg)   SpO2 95%   BMI 26.22 kg/m     A/P: Patient was without pain and feeling very comfortable, likely demand ischemia from prior Afib w/ RVR.  Will continue to trask patient for pain and trend troponins  Sherene Sires, DO 06/08/2017, 7:30 AM PGY-1, [place] Medicine Service pager [phone]

## 2017-06-08 NOTE — Progress Notes (Signed)
Patient troponin gone up again to 3.87. FMTS on call notified. Awaiting call back. Patient without any complains. Denies chest pain.   Will continue to monitor.  Delton Stelle, RN

## 2017-06-08 NOTE — Progress Notes (Signed)
Not able to send urine sample due to patient not being able to void since her arrival to [place]. Bladder scan done. Showed 80 ml. Patient stated that she did not drink much yesterday.   Will continue to monitor.  Ala Capri, RN

## 2017-06-08 NOTE — Progress Notes (Signed)
Family Medicine Teaching Service Daily Progress Note Intern Pager: [phone]  Patient name: Shirley Strickland Medical record number: [id number] Date of birth: March 22, 1916 Age: 83 y.o. Gender: female  Primary Care Provider: Alvester Chou, NP Consultants:  Code Status: DNR  Pt Overview and Major Events to Date:  Shirley Strickland is a 83 y.o. female presenting with possible syncopal vs AMS via EMS from home . PMH is significant for HTN, Afib, Anemia, DVT x3 , IVC filter in  2015, HFpEF   Assessment and Plan: Shirley Strickland is a 83 y.o. female presenting with possible syncopal vs AMS via EMS from home . PMH is significant for HTN, Afib, Anemia, DVT x3 , IVC filter in  2015, HFpEF   Possible syncopal episode vs hypoxia: Most likely etiology is arrythmia, Patient presented AFIB w/ rvr that resolved with IV metoprolol.  Also a possibility for TIA given hx of DVT, memory gaps, not on any anti-coagulation for AFib, thought no focal neurologic findings.  PE also a potential although made slightly less likely due to IVC filter, age adjusted d-dimer WNL at 0.54.  ACS less likely due to .04 troponins, EKG without ST changes on repeat once afib well controlled. However patient does endorse chest pain midsternal to left side, and this could be due to Afib with RVR.  Substance contribution less likely as patient denies illicit use and only drinks 1 beer per day.  Infectious less likely by no prior sick symptoms noted and patient is afebrile, CXR without signs of infection. Patient with slight leukocytosis likely due to stress from Afib with RVR -admit to tele, Dr. McDiarmid -Trend troponin x3 and AM EKG   -cardio consulted -patient back to baseline so utility of cerebral imagine can be discussed -VQ/CTA not ordered as age adjusted ddimer is not elevated and suspiscion of PE is low  Afib w/ RVR: ~140s on admission, resolved with IV metoprolol push. CHADVASC of 7 and HAS-BLEED of 3. As patient had a hx of falls in the  past anticoagulation was held  -home metoprolol as tolerated by BP -Cardiology consulted   Elevated troponins- 3.87>2.95>0.04.  ECG neg for stemi, no current chest pain, cardiology called and current suspiscion is demand ischemia from afib w/ RVR when admitted -will trend -followup ECG  -nursing and patient to report any new chest pain  AKI: SCR 1.38, baseline around 0.87  - Will continue to trend SCR  - Obtain UA - check for protein/hemaglobin cast  - Hold entresto  HFpEF - last echo 08/2013.  60-65%, G1DD.  BNP 475 ( no previous baseline determined). No signs of volume overload on exam, patient indicates slight dyspnea early today compared to her normal- could be due to arrhthymias, do not think patient if fluid overloaded   -cardio consulted -Would consider adding an ECHO -home metoprolol as BP allows -Hold Entresto due to AKI  Lower leg skin grafts: 83yrs old, increased pain per patient but exam looks better than past per patient - no new intervention  L femoral DVT- documented in chart, IVC filter, not currently complaining of pain. Home med is baby aspirin due to charted hx of decreasing hemoglobin on lovenox, thought not noted as an allergy  -continue home aspirin 81mg   HTN: Norm-hypertensive  -Continue Norvasc and Lopressor  - Hold entrestro  FEN/GI: Normal diet Prophylaxis:Lovenox   Disposition: to med-surg  Subjective:  Patient feels well this morning, would like to stay at least one more day so she feels a little  more confident about being home.   Does not want SNF, has a close support network but would like more home health resources if possible.  Objective: Temp:  [97.4 F (36.3 C)-98.9 F (37.2 C)] 98.7 F (37.1 C) (10/15 0449) Pulse Rate:  [69-151] 69 (10/15 0449) Resp:  [13-20] 18 (10/15 0449) BP: (95-162)/(45-99) 136/47 (10/15 0449) SpO2:  [83 %-100 %] 95 % (10/15 0449) Weight:  [148 lb (67.1 kg)] 148 lb (67.1 kg) (10/15 0021) Physical  Exam: General: no acute distress, patient conversation but very hard of hearing Eyes: EOMI, PERLLA ENTM: no lymphadenopathy, oral mucosa wihout noted lesions Cardiovascular: RRR, no murmurs noted, no edema in LE Respiratory: wheezing not audible on exam this AM, visible IWB when sitting up Gastrointestinal: bowel sounds in all 4 qudrants, no masses ntoed MSK: patient stated that UE were at baseline 4/5 but affirming given age, LE was weaker per patient but still 4/5.  Exam of LE strength made difficult by patient's painful grafts Derm: extensive skin grafts on LE Neuro: no focal deficits noted on CN exam Psych: appropriate, pleasant   Laboratory:  Recent Labs Lab 06/07/17 1657 06/08/17 0429  WBC 14.4* 12.1*  HGB 11.0* 9.3*  HCT 34.4* 29.1*  PLT 310 217    Recent Labs Lab 06/07/17 1657 06/08/17 0429  NA 132* 132*  K 4.4 5.1  CL 98* 98*  CO2 20* 24  BUN 22* 28*  CREATININE 1.38* 1.46*  CALCIUM 9.6 9.3  PROT  --  5.6*  BILITOT  --  0.7  ALKPHOS  --  54  ALT  --  11*  AST  --  28  GLUCOSE 109* 114*    troponins 3.87, 2.95, .0.04  Imaging/Diagnostic Tests: Dg Chest Port 1 View  Result Date: 06/07/2017 CLINICAL DATA:  Onset of dizziness this morning. EXAM: PORTABLE CHEST 1 VIEW COMPARISON:  PA and lateral chest 10/22/2015. FINDINGS: Mild left basilar atelectasis is seen. The lungs are otherwise clear. Heart size is upper normal. No pneumothorax or pleural effusion. Aortic atherosclerosis noted. No acute bony abnormality. IMPRESSION: No acute disease. Electronically Signed   By: Inge Rise M.D.   On: 06/07/2017 17:13     Sherene Sires, DO 06/08/2017, 9:00 AM PGY-1, [place] Intern pager: [phone], text pages welcome

## 2017-06-08 NOTE — Progress Notes (Signed)
FPTS Interim Progress Note  Called about elevated troponin in patient at 2.95   O: BP (!) 157/55 (BP Location: Left Arm)   Pulse 79   Temp 98.9 F (37.2 C) (Oral)   Resp 18   Ht 5\' 3"  (1.6 m)   Wt 148 lb (67.1 kg)   SpO2 97%   BMI 26.22 kg/m     A/P: Given prior afib w/ rvr earlier in day and lack of chest pain/SOB now, likely demand ischemia which should resolve as HR has been normal since IV metoprolol.   Paged cardiology to make sure they were aware.  Sherene Sires, DO 06/08/2017, 1:00 AM PGY-1, [place] Medicine Service pager [phone]

## 2017-06-08 NOTE — Progress Notes (Signed)
Patient troponin 2.95. Patient denies pain 0/10. FMTS on call notified.  No orders for right now. Possible cardiology consult.  Will continue to monitor.  George Alcantar, RN

## 2017-06-09 DIAGNOSIS — I5032 Chronic diastolic (congestive) heart failure: Secondary | ICD-10-CM

## 2017-06-09 DIAGNOSIS — R748 Abnormal levels of other serum enzymes: Secondary | ICD-10-CM

## 2017-06-09 DIAGNOSIS — I214 Non-ST elevation (NSTEMI) myocardial infarction: Secondary | ICD-10-CM

## 2017-06-09 DIAGNOSIS — R778 Other specified abnormalities of plasma proteins: Secondary | ICD-10-CM

## 2017-06-09 DIAGNOSIS — R7989 Other specified abnormal findings of blood chemistry: Secondary | ICD-10-CM

## 2017-06-09 DIAGNOSIS — N179 Acute kidney failure, unspecified: Secondary | ICD-10-CM

## 2017-06-09 LAB — BASIC METABOLIC PANEL
ANION GAP: 5 (ref 5–15)
Anion gap: 6 (ref 5–15)
BUN: 27 mg/dL — ABNORMAL HIGH (ref 6–20)
BUN: 25 mg/dL — ABNORMAL HIGH (ref 6–20)
CHLORIDE: 96 mmol/L — AB (ref 101–111)
CHLORIDE: 96 mmol/L — AB (ref 101–111)
CO2: 26 mmol/L (ref 22–32)
CO2: 25 mmol/L (ref 22–32)
Calcium: 9 mg/dL (ref 8.9–10.3)
Calcium: 9.3 mg/dL (ref 8.9–10.3)
Creatinine, Ser: 1.2 mg/dL — ABNORMAL HIGH (ref 0.44–1.00)
Creatinine, Ser: 1.06 mg/dL — ABNORMAL HIGH (ref 0.44–1.00)
GFR calc non Af Amer: 36 mL/min — ABNORMAL LOW (ref >60)
GFR calc non Af Amer: 42 mL/min — ABNORMAL LOW (ref >60)
GFR, EST AFRICAN AMERICAN: 42 mL/min — AB (ref >60)
GFR, EST AFRICAN AMERICAN: 49 mL/min — AB (ref >60)
Glucose, Bld: 127 mg/dL — ABNORMAL HIGH (ref 65–99)
Glucose, Bld: 119 mg/dL — ABNORMAL HIGH (ref 65–99)
POTASSIUM: 5.1 mmol/L (ref 3.5–5.1)
POTASSIUM: 5.1 mmol/L (ref 3.5–5.1)
SODIUM: 127 mmol/L — AB (ref 135–145)
Sodium: 127 mmol/L — ABNORMAL LOW (ref 135–145)

## 2017-06-09 LAB — URINALYSIS, ROUTINE W REFLEX MICROSCOPIC
Bacteria, UA: NONE SEEN
Bilirubin Urine: NEGATIVE
Glucose, UA: NEGATIVE mg/dL
HGB URINE DIPSTICK: NEGATIVE
Ketones, ur: NEGATIVE mg/dL
Leukocytes, UA: NEGATIVE
Nitrite: NEGATIVE
Protein, ur: NEGATIVE mg/dL
SPECIFIC GRAVITY, URINE: 1.009 (ref 1.005–1.030)
Squamous Epithelial / LPF: NONE SEEN
pH: 6 (ref 5.0–8.0)

## 2017-06-09 LAB — CBC
HCT: 28.9 % — ABNORMAL LOW (ref 36.0–46.0)
HEMATOCRIT: 26.8 % — AB (ref 36.0–46.0)
HEMOGLOBIN: 8.7 g/dL — AB (ref 12.0–15.0)
HEMOGLOBIN: 9.1 g/dL — AB (ref 12.0–15.0)
MCH: 27.9 pg (ref 26.0–34.0)
MCH: 27.3 pg (ref 26.0–34.0)
MCHC: 32.5 g/dL (ref 30.0–36.0)
MCHC: 31.5 g/dL (ref 30.0–36.0)
MCV: 85.9 fL (ref 78.0–100.0)
MCV: 86.8 fL (ref 78.0–100.0)
Platelets: 189 10*3/uL (ref 150–400)
Platelets: 212 10*3/uL (ref 150–400)
RBC: 3.12 MIL/uL — ABNORMAL LOW (ref 3.87–5.11)
RBC: 3.33 MIL/uL — AB (ref 3.87–5.11)
RDW: 13.7 % (ref 11.5–15.5)
RDW: 13.9 % (ref 11.5–15.5)
WBC: 10.8 10*3/uL — AB (ref 4.0–10.5)
WBC: 10.7 10*3/uL — ABNORMAL HIGH (ref 4.0–10.5)

## 2017-06-09 MED ORDER — SODIUM CHLORIDE 0.9 % IV BOLUS (SEPSIS): 500.0000 mL | Freq: Once | INTRAVENOUS | Status: DC

## 2017-06-09 NOTE — Evaluation (Signed)
Occupational Therapy Evaluation Patient Details Name: Shirley Strickland MRN: [id number] DOB: June 19, 1916 Today's Date: 06/09/2017    History of Present Illness 83 y.o. admitted with afib with RVR. PMH is significant for HTN, Afib, Anemia, DVT x3 , IVC filter in 2015, HFpEF    Clinical Impression   PTA, pt was living alone and was performing her BADLs and family performs IADLs such as providing simple meals. Family reporting they can provide initial 24 hour support at dc. Pt currently performing ADLs and functional mobility at [facility] level. Pt presenting with decreased activity tolerance. Pt would benefit from further acute OT to facilitate safe dc. Recommend dc with 24 hour supervision/assistance and with HHOT to increase safety and independence with ADLs and functional mobility.     Follow Up Recommendations  Home health OT;Supervision/Assistance - 24 hour    Equipment Recommendations  None recommended by OT    Recommendations for Other Services PT consult     Precautions / Restrictions Precautions Precautions: Fall Restrictions Weight Bearing Restrictions: No      Mobility Bed Mobility               General bed mobility comments: Pt in recliner upon arrival  Transfers Overall transfer level: Needs assistance Equipment used: Rolling walker (2 wheeled) Transfers: Sit to/from Stand Sit to Stand: Min guard;Supervision         General transfer comment: Min Guard for safety    Balance Overall balance assessment: Needs assistance Sitting-balance support: No upper extremity supported;Feet supported Sitting balance-Leahy Scale: Good     Standing balance support: Bilateral upper extremity supported;During functional activity Standing balance-Leahy Scale: Fair Standing balance comment: can stand statically and keep balance                           ADL either performed or assessed with clinical judgement   ADL Overall ADL's : Needs  assistance/impaired Eating/Feeding: Set up;Sitting   Grooming: Wash/dry hands;Min guard;Standing   Upper Body Bathing: Set up;Supervision/ safety;Sitting   Lower Body Bathing: Min guard;Sit to/from stand   Upper Body Dressing : Set up;Supervision/safety;Sitting   Lower Body Dressing: Min guard;Sit to/from stand   Toilet Transfer: Min guard;Ambulation;Regular Toilet;Grab bars;RW   Toileting- Clothing Manipulation and Hygiene: Set up;Supervision/safety;Sitting/lateral lean       Functional mobility during ADLs: Min guard;Rolling walker General ADL Comments: Pt performing LB ADLs and mobility with Min Guard A for safety. Pt presenting with decreased activity tolerance and increased fatigue. Pt very motivated to increase activity tolerance and return to PLOF. Pt walking to bathroom for toileting and then to sink to wash hands. Pt very pleased with her progress. Discussed using BSC at night by bed to decreased fall risk.      Vision Baseline Vision/History: Wears glasses Wears Glasses: Reading only Patient Visual Report: No change from baseline       Perception     Praxis      Pertinent Vitals/Pain Pain Assessment: No/denies pain     Hand Dominance Right   Extremity/Trunk Assessment Upper Extremity Assessment Upper Extremity Assessment: Generalized weakness   Lower Extremity Assessment Lower Extremity Assessment: Defer to PT evaluation   Cervical / Trunk Assessment Cervical / Trunk Assessment: Kyphotic   Communication Communication Communication: HOH   Cognition Arousal/Alertness: Awake/alert Behavior During Therapy: WFL for tasks assessed/performed Overall Cognitive Status: Within Functional Limits for tasks assessed  General Comments  neice present throughout session    Exercises     Shoulder Instructions      Home Living Family/patient expects to be discharged to:: Private residence Living Arrangements:  Alone Available Help at Discharge: Family;Available PRN/intermittently Type of Home: House Home Access: Other (comment) (always comes in and leaves via ambulance)     Home Layout: One level     Bathroom Shower/Tub:  (sponge bathes)   Bathroom Toilet: Standard Bathroom Accessibility: Yes   Home Equipment: Walker - 2 wheels;Walker - 4 wheels;Cane - single point;Bedside commode;Hospital bed          Prior Functioning/Environment Level of Independence: Needs assistance  Gait / Transfers Assistance Needed: was able to use RW in home with Modif I.  ADL's / Homemaking Assistance Needed: sponge bathes, niece makes meals and pt microwaves them.             OT Problem List: Decreased activity tolerance;Impaired balance (sitting and/or standing);Decreased knowledge of use of DME or AE;Pain      OT Treatment/Interventions: Self-care/ADL training;Therapeutic exercise;Energy conservation;DME and/or AE instruction;Therapeutic activities;Patient/family education    OT Goals(Current goals can be found in the care plan section) Acute Rehab OT Goals Patient Stated Goal: to go home OT Goal Formulation: With patient Time For Goal Achievement: 06/23/17 Potential to Achieve Goals: Good ADL Goals Pt Will Perform Grooming: with modified independence;standing Pt Will Perform Upper Body Dressing: with modified independence;sitting Pt Will Perform Lower Body Dressing: with modified independence;sit to/from stand Pt Will Transfer to Toilet: with modified independence;ambulating;regular height toilet  OT Frequency: Min 2X/week   Barriers to D/C:            Co-evaluation              AM-PAC PT "6 Clicks" Daily Activity     Outcome Measure Help from another person eating meals?: None Help from another person taking care of personal grooming?: A Little Help from another person toileting, which includes using toliet, bedpan, or urinal?: A Little Help from another person bathing  (including washing, rinsing, drying)?: A Little Help from another person to put on and taking off regular upper body clothing?: A Little Help from another person to put on and taking off regular lower body clothing?: A Little 6 Click Score: 19   End of Session Equipment Utilized During Treatment: Gait belt;Rolling walker Nurse Communication: Mobility status  Activity Tolerance: Patient tolerated treatment well Patient left: in chair;with call bell/phone within reach;with family/visitor present  OT Visit Diagnosis: Unsteadiness on feet (R26.81);Other abnormalities of gait and mobility (R26.89);Muscle weakness (generalized) (M62.81)                Time: 8185-6314 OT Time Calculation (min): 28 min Charges:  OT General Charges $OT Visit: 1 Visit OT Evaluation $OT Eval Moderate Complexity: 1 Mod OT Treatments $Self Care/Home Management : 8-22 mins G-Codes:     Ramanda Paules MSOT, OTR/L Acute Rehab Pager: [phone] Office: [place] 06/09/2017, 5:00 PM

## 2017-06-09 NOTE — Progress Notes (Signed)
Family Medicine Teaching Service Daily Progress Note Intern Pager: [phone]  Patient name: Shirley Strickland Medical record number: [id number] Date of birth: 25-Dec-1915 Age: 83 y.o. Gender: female  Primary Care Provider: Alvester Chou, NP Consultants:  Code Status: DNR  Pt Overview and Major Events to Date:  Shirley Strickland is a 83 y.o. female presenting with possible syncopal vs AMS via EMS from home . PMH is significant for HTN, Afib, Anemia, DVT x3 , IVC filter in  2015, HFpEF   Assessment and Plan: NICHOLE Strickland is a 83 y.o. female presenting with possible syncopal vs AMS via EMS from home . PMH is significant for HTN, Afib, Anemia, DVT x3 , IVC filter in  2015, HFpEF   Possible syncopal episode vs hypoxia: Most likely etiology is arrythmia, Patient presented AFIB w/ rvr that resolved with IV metoprolol.   -admit to tele, Dr. McDiarmid -metoprolol 12.5BID -patient back to baseline so utility of cerebral imagine can be discussed -VQ/CTA not ordered as age adjusted ddimer is not elevated and suspiscion of PE is low  Afib w/ RVR: rate controlled -home metoprolol as tolerated by BP -Cardiology consulted   NSTEMI- troponin trending down since yesterday afternoon.  ECG neg for stemi, no current chest pain, cardiology called and current suspiscion is demand ischemia from afib w/ RVR when admitted -nursing and patient to report any new chest pain  AKI: SCR 1.38, baseline around 0.87  - Will continue to trend SCR  - Obtain UA - check for protein/hemaglobin cast  - Hold entresto  HFpEF - last echo 08/2013.  60-65%, G1DD.  BNP 475 ( no previous baseline determined). No signs of volume overload on exam, patient indicates slight dyspnea early today compared to her normal- could be due to arrhthymias, do not think patient if fluid overloaded   -cardio consulted -Would consider adding an ECHO -home metoprolol as BP allows -Hold Entresto due to AKI  Lower leg skin grafts: 83yrs old, increased  pain per patient but exam looks better than past per patient - no new intervention  L femoral DVT- documented in chart, IVC filter, not currently complaining of pain. Home med is baby aspirin due to charted hx of decreasing hemoglobin on lovenox, thought not noted as an allergy  -continue home aspirin 81mg   Hyponatremia- 127 down from 132 yesterday -will discuss salt tabs  Symptomatic Anemia-8.7 down from 9.3 -will follow and note in d/c summary  HTN: Norm-hypertensive  -Continue Norvasc and Lopressor  - Hold entrestro  FEN/GI: Normal diet Prophylaxis:Lovenox   Disposition: still wants home with home health but would like to stay another day  Subjective:  Patient says they feel weaker today and are not in pain but are less confident of their ability to function independently at home  Objective: Temp:  [97.8 F (36.6 C)-99.1 F (37.3 C)] 99.1 F (37.3 C) (10/15 2336) Pulse Rate:  [61-69] 65 (10/15 2336) Resp:  [17-18] 17 (10/15 2336) BP: (120-147)/(43-61) 147/61 (10/15 2336) SpO2:  [95 %-98 %] 97 % (10/15 2336) Physical Exam: General: no acute distress, patient conversation but very hard of hearing Eyes: EOMI, PERLLA ENTM: no lymphadenopathy, oral mucosa wihout noted lesions Cardiovascular: RRR, no murmurs noted, no edema in LE Respiratory: wheezing/crackles not audible on exam this AM, visible IWB when sitting up Gastrointestinal: bowel sounds in all 4 qudrants, no masses ntoed MSK: patient stated that UE were at baseline 4/5 but affirming given age, LE was weaker per patient but still 4/5.  Exam  of LE strength made difficult by patient's painful grafts Derm: extensive skin grafts on LE Neuro: no focal deficits noted on CN exam Psych: appropriate, pleasant   Laboratory:  Recent Labs Lab 06/07/17 1657 06/08/17 0429  WBC 14.4* 12.1*  HGB 11.0* 9.3*  HCT 34.4* 29.1*  PLT 310 217    Recent Labs Lab 06/07/17 1657 06/08/17 0429  NA 132* 132*  K 4.4 5.1  CL  98* 98*  CO2 20* 24  BUN 22* 28*  CREATININE 1.38* 1.46*  CALCIUM 9.6 9.3  PROT  --  5.6*  BILITOT  --  0.7  ALKPHOS  --  54  ALT  --  11*  AST  --  28  GLUCOSE 109* 114*    Imaging/Diagnostic Tests: Dg Chest Port 1 View  Result Date: 06/07/2017 CLINICAL DATA:  Onset of dizziness this morning. EXAM: PORTABLE CHEST 1 VIEW COMPARISON:  PA and lateral chest 10/22/2015. FINDINGS: Mild left basilar atelectasis is seen. The lungs are otherwise clear. Heart size is upper normal. No pneumothorax or pleural effusion. Aortic atherosclerosis noted. No acute bony abnormality. IMPRESSION: No acute disease. Electronically Signed   By: Inge Rise M.D.   On: 06/07/2017 17:13     Sherene Sires, DO 06/09/2017, 3:05 AM PGY-1, [place] Intern pager: [phone], text pages welcome

## 2017-06-09 NOTE — Progress Notes (Signed)
MD called to verify 500 fluid bolus. Will call RN back with clarification.

## 2017-06-09 NOTE — Discharge Summary (Signed)
[facility] Discharge Summary  Patient name: Shirley Strickland Medical record number: [id number] Date of birth: [birth year] Age: 83 y.o. Gender: female Date of Admission: 06/07/2017  Date of Discharge: 06/10/17 Admitting Physician: Blane Ohara McDiarmid, MD  Primary Care Provider: Alvester Chou, NP Consultants:   Indication for Hospitalization: long lie syndrome  Discharge Diagnoses/Problem List:  Long lie syndrome, NSTEMI, Afib w/ rvr, hypothyroid, AKI  Disposition: to home with home health and family supervision, patient was told our strong advice was SNF  Discharge Condition: stable  Discharge Exam: General: no acute distress, patient conversation but very hard of hearing Eyes: EOMI, PERLLA ENTM: no lymphadenopathy, oral mucosa wihout noted lesions Cardiovascular: RRR, no murmurs noted, no edema in LE Respiratory: lungs CTA bilaterally, visible IWB when sitting up Gastrointestinal: bowel sounds in all 4 qudrants, no masses ntoed MSK: patient stated that UE were at baseline 4/5 but affirming given age, LE was weaker per patient but still 4/5. Exam of LE strength made difficult by patient's painful grafts Derm: extensive skin grafts on LE Neuro: no focal deficits noted on CN exam Psych: appropriate, pleasant   Brief Hospital Course:  Patient presented via EMS after being found in her chair unresponsive with vomit on herself after an indeterminate amount of time. She had afib w/ RVR that resolved with IV push metoprolol.   AKI which improved over 2 days with hydration.   She has noted weakness and lack of confidence in her ability to ambulate around her house without assistance.     Issues for Follow Up:  1. Home health PT/OT was requested but we have significant concerns about patient's ability to live safely at home without constant supervision.  We have discussed this with her at length and she has chose to go home but expressed she would discuss the topic  further with her primary care NP, Alvester Chou. 2. Patient had afib w/rvr and is on metoprolol for that, please monitor for rate control 3. Patient was diagnosed with NSTEMI which could have been a result of demand ischemia, please continue to monitor her cardiac status 4. Her entresto was held in setting of AKI, please consider when it might be appropriate to restart this medicine. 5. Patient was hyponatremic.   It is possible that this is a chronic issue but we wanted it to be brought to your attention as the primary care.  Significant Procedures:   Significant Labs and Imaging:   Recent Labs Lab 06/09/17 0441 06/09/17 1421 06/10/17 0652  WBC 10.8* 10.7* 8.7  HGB 8.7* 9.1* 9.1*  HCT 26.8* 28.9* 28.2*  PLT 189 212 203    Recent Labs Lab 06/07/17 1657 06/08/17 0429 06/09/17 0441 06/09/17 1421 06/10/17 0652  NA 132* 132* 127* 127* 128*  K 4.4 5.1 5.1 5.1 4.8  CL 98* 98* 96* 96* 97*  CO2 20* 24 26 25 25   GLUCOSE 109* 114* 127* 119* 104*  BUN 22* 28* 27* 25* 22*  CREATININE 1.38* 1.46* 1.20* 1.06* 1.06*  CALCIUM 9.6 9.3 9.0 9.3 9.2  MG 1.8  --   --   --   --   ALKPHOS  --  54  --   --   --   AST  --  28  --   --   --   ALT  --  11*  --   --   --   ALBUMIN  --  3.2*  --   --   --  Dg Chest Port 1 View  Result Date: 06/07/2017 CLINICAL DATA:  Onset of dizziness this morning. EXAM: PORTABLE CHEST 1 VIEW COMPARISON:  PA and lateral chest 10/22/2015. FINDINGS: Mild left basilar atelectasis is seen. The lungs are otherwise clear. Heart size is upper normal. No pneumothorax or pleural effusion. Aortic atherosclerosis noted. No acute bony abnormality. IMPRESSION: No acute disease. Electronically Signed   By: Inge Rise M.D.   On: 06/07/2017 17:13    Results/Tests Pending at Time of Discharge:   Discharge Medications:  Allergies as of 06/10/2017      Reactions   Rocephin [ceftriaxone Sodium In Dextrose]    Unable to breath   Clarithromycin Nausea Only   Cod [fish  Allergy] Other (See Comments)   Stomach pains and passed out recently - doesn't remember reaction before this weekend   Codeine Nausea And Vomiting   Darvocet [propoxyphene N-acetaminophen] Nausea And Vomiting   Demadex [torsemide] Hives, Nausea And Vomiting   UNKNOWN   Diovan Hct [valsartan-hydrochlorothiazide] Other (See Comments)   Nausea, sick, hives  06-07-17 PATIENT IS CURRENTLY ON THIS MEDICATION, NO SIDE EFFECTS   Ex-lax [senna] Other (See Comments)   unknown   Ibuprofen Nausea Only   Indanediones Other (See Comments)   unknown   Indapamide Other (See Comments)   unknown   Morphine And Related Nausea And Vomiting   Sulfa Antibiotics Other (See Comments)   Couldn't breathe pt states that she was in a coma foe 3 weeks   Benzonatate Itching, Rash   Eyes swelled   Penicillins Rash   Septra [bactrim] Rash      Medication List    STOP taking these medications   amLODipine 5 MG tablet Commonly known as:  NORVASC   ENTRESTO 49-51 MG Generic drug:  sacubitril-valsartan     TAKE these medications   aspirin EC 81 MG tablet Take 81 mg by mouth daily.   folic acid 1 MG tablet Commonly known as:  FOLVITE Take 1 tablet (1 mg total) by mouth daily.   levothyroxine 88 MCG tablet Commonly known as:  SYNTHROID, LEVOTHROID Take 44 mcg by mouth every morning.   metoprolol tartrate 25 MG tablet Commonly known as:  LOPRESSOR Take 0.5 tablets (12.5 mg total) by mouth 2 (two) times daily. What changed:  medication strength   multivitamin with minerals Tabs tablet Take 1 tablet by mouth daily.   thiamine 100 MG tablet Take 1 tablet (100 mg total) by mouth daily.       Discharge Instructions: Please refer to Patient Instructions section of EMR for full details.  Patient was counseled important signs and symptoms that should prompt return to medical care, changes in medications, dietary instructions, activity restrictions, and follow up appointments.   Follow-Up  Appointments: Follow-up Information    Alvester Chou, NP Follow up.   Specialty:  Nurse Practitioner Why:  Left message for office Contact information: [place] [address] [place] [address] [phone]        [facility] Follow up.   Specialty:  [place] Why:  They will do your home health care at your home Contact information: [place] [address] [place], [place], DO 06/10/2017, 11:50 AM PGY-1, [place]

## 2017-06-09 NOTE — Evaluation (Signed)
Physical Therapy Evaluation Patient Details Name: Shirley Strickland MRN: [id number] DOB: 19-Oct-1915 Today's Date: 06/09/2017   History of Present Illness  Pt admit with afib with RVR. Past Medical History:  Diagnosis Date  . A-fib ([place])   . 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 Bruise    bruise on left foot from fall 07/03/11  . Cancer [facility])    pt states only skin cancer  . CHF (congestive heart failure) ([place])   . DVT (deep venous thrombosis) ([place]) 12/28/2013   LEFT   . Fall   . GERD (gastroesophageal reflux disease)   . HTN (hypertension)   . Knee pain, acute 07/04/11   Pt states left knee twisted under her in fall 07/03/11 and now painful  . Numbness of legs    started after fall yesterday 07/03/11  . Osteopenia   . Pneumonia   . Rash and nonspecific skin eruption 07/04/11   rash on bilateral upper legs  . Syncope     Clinical Impression  Pt admitted with above diagnosis. Pt currently with functional limitations due to the deficits listed below (see PT Problem List). Pt was able to ambulate with RW and admits weakness but feels close enough to baseline that she can go home with [facility] services as recommended below.  Pt did not have LOB.  Will follow acutely.   Pt will benefit from skilled PT to increase their independence and safety with mobility to allow discharge to the venue listed below.      Follow Up Recommendations Home health PT;Supervision - Intermittent (HHOT)    Equipment Recommendations  None recommended by PT    Recommendations for Other Services       Precautions / Restrictions Precautions Precautions: Fall Restrictions Weight Bearing Restrictions: No      Mobility  Bed Mobility Overal bed mobility: Modified Independent             General bed mobility comments: was able to get out of bed and has hospital bed at home  Transfers Overall transfer level: Needs assistance Equipment used: Rolling walker (2 wheeled) Transfers: Sit to/from Stand Sit  to Stand: Min guard;Supervision         General transfer comment: did not assist pt.    Ambulation/Gait Ambulation/Gait assistance: Min guard Ambulation Distance (Feet): 30 Feet Assistive device: Rolling walker (2 wheeled) Gait Pattern/deviations: Step-through pattern;Decreased stride length;Trunk flexed;Wide base of support   Gait velocity interpretation: Below normal speed for age/gender General Gait Details: Pt was able to ambulate in room without PT assist.  Able to manuever around furniture.  No LOB in controlled environement.   Stairs            Wheelchair Mobility    Modified Rankin (Stroke Patients Only)       Balance Overall balance assessment: Needs assistance Sitting-balance support: No upper extremity supported;Feet supported Sitting balance-Leahy Scale: Good     Standing balance support: Bilateral upper extremity supported;During functional activity Standing balance-Leahy Scale: Fair Standing balance comment: can stand statically and keep balance                             Pertinent Vitals/Pain Pain Assessment: No/denies pain  VSS  Home Living Family/patient expects to be discharged to:: Private residence Living Arrangements: Alone Available Help at Discharge: Family;Available PRN/intermittently Type of Home: House Home Access: Other (comment) (always comes in and leaves via ambulance)     Home  Layout: One level Home Equipment: Walker - 2 wheels;Walker - 4 wheels;Cane - single point;Bedside commode;Hospital bed      Prior Function Level of Independence: Needs assistance   Gait / Transfers Assistance Needed: was able to use RW in home with Modif I.   ADL's / Homemaking Assistance Needed: sponge bathes, niece makes meals and pt microwaves them.         Hand Dominance        Extremity/Trunk Assessment   Upper Extremity Assessment Upper Extremity Assessment: Defer to OT evaluation    Lower Extremity Assessment Lower  Extremity Assessment: Generalized weakness    Cervical / Trunk Assessment Cervical / Trunk Assessment: Kyphotic  Communication   Communication: HOH  Cognition Arousal/Alertness: Awake/alert Behavior During Therapy: WFL for tasks assessed/performed Overall Cognitive Status: Within Functional Limits for tasks assessed                                        General Comments      Exercises General Exercises - Lower Extremity Ankle Circles/Pumps: AROM;Both;10 reps;Supine Long Arc Quad: AROM;Both;10 reps;Seated Hip ABduction/ADduction: AROM;Both;10 reps;Supine Hip Flexion/Marching: AROM;Both;10 reps;Seated   Assessment/Plan    PT Assessment Patient needs continued PT services  PT Problem List Decreased activity tolerance;Decreased balance;Decreased mobility;Decreased knowledge of precautions;Decreased safety awareness;Decreased knowledge of use of DME       PT Treatment Interventions DME instruction;Gait training;Functional mobility training;Therapeutic activities;Balance training;Therapeutic exercise;Patient/family education    PT Goals (Current goals can be found in the Care Plan section)  Acute Rehab PT Goals Patient Stated Goal: to go home PT Goal Formulation: With patient Time For Goal Achievement: 06/23/17 Potential to Achieve Goals: Good    Frequency Min 3X/week   Barriers to discharge        Co-evaluation               AM-PAC PT "6 Clicks" Daily Activity  Outcome Measure Difficulty turning over in bed (including adjusting bedclothes, sheets and blankets)?: None Difficulty moving from lying on back to sitting on the side of the bed? : None Difficulty sitting down on and standing up from a chair with arms (e.g., wheelchair, bedside commode, etc,.)?: None Help needed moving to and from a bed to chair (including a wheelchair)?: A Little Help needed walking in hospital room?: A Little Help needed climbing 3-5 steps with a railing? : Total 6  Click Score: 19    End of Session Equipment Utilized During Treatment: Gait belt Activity Tolerance: Patient limited by fatigue Patient left: in chair;with call bell/phone within reach;with family/visitor present Nurse Communication: Mobility status PT Visit Diagnosis: Unsteadiness on feet (R26.81);Muscle weakness (generalized) (M62.81)    Time: 4742-5956 PT Time Calculation (min) (ACUTE ONLY): 20 min   Charges:   PT Evaluation $PT Eval Moderate Complexity: 1 Mod     PT G Codes:        Duc Crocket,PT Acute Rehabilitation [phone] (pager)   Denice Paradise 06/09/2017, 11:37 AM

## 2017-06-10 DIAGNOSIS — E039 Hypothyroidism, unspecified: Secondary | ICD-10-CM

## 2017-06-10 LAB — CBC
HCT: 28.2 % — ABNORMAL LOW (ref 36.0–46.0)
Hemoglobin: 9.1 g/dL — ABNORMAL LOW (ref 12.0–15.0)
MCH: 27.7 pg (ref 26.0–34.0)
MCHC: 32.3 g/dL (ref 30.0–36.0)
MCV: 86 fL (ref 78.0–100.0)
Platelets: 203 10*3/uL (ref 150–400)
RBC: 3.28 MIL/uL — AB (ref 3.87–5.11)
RDW: 13.8 % (ref 11.5–15.5)
WBC: 8.7 10*3/uL (ref 4.0–10.5)

## 2017-06-10 LAB — BASIC METABOLIC PANEL WITH GFR
Anion gap: 6 (ref 5–15)
BUN: 22 mg/dL — ABNORMAL HIGH (ref 6–20)
CO2: 25 mmol/L (ref 22–32)
Calcium: 9.2 mg/dL (ref 8.9–10.3)
Chloride: 97 mmol/L — ABNORMAL LOW (ref 101–111)
Creatinine, Ser: 1.06 mg/dL — ABNORMAL HIGH (ref 0.44–1.00)
GFR calc Af Amer: 49 mL/min — ABNORMAL LOW
GFR calc non Af Amer: 42 mL/min — ABNORMAL LOW
Glucose, Bld: 104 mg/dL — ABNORMAL HIGH (ref 65–99)
Potassium: 4.8 mmol/L (ref 3.5–5.1)
Sodium: 128 mmol/L — ABNORMAL LOW (ref 135–145)

## 2017-06-10 MED ORDER — ADULT MULTIVITAMIN W/MINERALS CH: 1.0000 | ORAL_TABLET | Freq: Every day | ORAL | 0 refills | Status: AC

## 2017-06-10 MED ORDER — METOPROLOL TARTRATE 25 MG PO TABS: 12.5000 mg | ORAL_TABLET | Freq: Two times a day (BID) | ORAL | 0 refills | Status: DC

## 2017-06-10 MED ORDER — THIAMINE HCL 100 MG PO TABS: 100.0000 mg | ORAL_TABLET | Freq: Every day | ORAL | 0 refills | Status: AC

## 2017-06-10 MED ORDER — FOLIC ACID 1 MG PO TABS: 1.0000 mg | ORAL_TABLET | Freq: Every day | ORAL | 0 refills | Status: AC

## 2017-06-10 NOTE — Progress Notes (Signed)
Family Medicine Teaching Service Daily Progress Note Intern Pager: [phone]  Patient name: Shirley Strickland Medical record number: [id number] Date of birth: 1916-04-03 Age: 83 y.o. Gender: female  Primary Care Provider: Alvester Chou, NP Consultants:  Code Status: DNR  Pt Overview and Major Events to Date:  Shirley Strickland is a 83 y.o. female presenting with possible syncopal vs AMS via EMS from home . PMH is significant for HTN, Afib, Anemia, DVT x3 , IVC filter in  2015, HFpEF   Assessment and Plan: Shirley Strickland is a 83 y.o. female presenting with possible syncopal vs AMS via EMS from home . 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   -admit to tele, Dr. McDiarmid -metoprolol 12.5BID -patient back to baseline so utility of cerebral imagine can be discussed -VQ/CTA not ordered as age adjusted ddimer is not elevated and suspiscion of PE is low  Afib w/ RVR: rate controlled -home metoprolol as tolerated by BP -Cardiology consulted   NSTEMI- troponin trending down since yesterday afternoon.  ECG neg for stemi, no current chest pain, cardiology called and current suspiscion is demand ischemia from afib w/ RVR when admitted -nursing and patient to report any new chest pain  AKI: SCR 1.38, baseline around 0.87  - Will continue to trend SCR  - Obtain UA - check for protein/hemaglobin cast  - Hold entresto  HFpEF - last echo 08/2013.  60-65%, G1DD.  BNP 475 ( no previous baseline determined). No signs of volume overload on exam, patient indicates slight dyspnea early today compared to her normal- could be due to arrhthymias, do not think patient if fluid overloaded   -cardio consulted -Would consider adding an ECHO -home metoprolol as BP allows -Hold Entresto due to AKI  Lower leg skin grafts: 83yrs  old, increased pain per patient but exam looks better than past per patient - no new intervention  L femoral DVT- documented in chart, IVC filter, not currently complaining of pain. Home med is baby aspirin due to charted hx of decreasing hemoglobin on lovenox, thought not noted as an allergy  -continue home aspirin 81mg   Hyponatremia- 127 down from 132 yesterday -will discuss salt tabs  Symptomatic Anemia-8.7 down from 9.3 -will follow and note in d/c summary  HTN: Norm-hypertensive  -Continue Norvasc and Lopressor  - Hold entrestro  FEN/GI: Normal diet Prophylaxis:Lovenox   Disposition: home health with pt/ot and family offering 24hr supervision temp, patient told we think she is safer with SNF  Subjective:  Patient was happy to go home with pt/ot and family watching her 24/7, she says this hospital stay is forcing her to rethink if she can live alone long term without someone there but she wants to discuss with her family and long term primary NP  Objective: Temp:  [98.4 F (36.9 C)-98.8 F (37.1 C)] 98.4 F (36.9 C) (10/17 0504) Pulse Rate:  [57-65] 60 (10/17 0504) Resp:  [18-20] 20 (10/17 0504) BP: (147-160)/(45-57) 160/57 (10/17 0504) SpO2:  [100 %] 100 % (10/17 0504) Weight:  [154 lb 3.2 oz (69.9 kg)] 154 lb 3.2 oz (69.9 kg) (10/17 0504) Physical Exam: General: no acute distress, patient conversation but very hard of hearing Eyes: EOMI, PERLLA ENTM: no lymphadenopathy, oral mucosa wihout noted lesions Cardiovascular: RRR, no murmurs noted, no edema  in LE Respiratory: lungs CTA bilaterally, visible IWB when sitting up Gastrointestinal: bowel sounds in all 4 qudrants, no masses ntoed MSK: patient stated that UE were at baseline 4/5 but affirming given age, LE was weaker per patient but still 4/5.  Exam of LE strength made difficult by patient's painful grafts Derm: extensive skin grafts on LE Neuro: no focal deficits noted on CN exam Psych: appropriate, pleasant    Laboratory:  Recent Labs Lab 06/08/17 0429 06/09/17 0441 06/09/17 1421  WBC 12.1* 10.8* 10.7*  HGB 9.3* 8.7* 9.1*  HCT 29.1* 26.8* 28.9*  PLT 217 189 212    Recent Labs Lab 06/08/17 0429 06/09/17 0441 06/09/17 1421  NA 132* 127* 127*  K 5.1 5.1 5.1  CL 98* 96* 96*  CO2 24 26 25   BUN 28* 27* 25*  CREATININE 1.46* 1.20* 1.06*  CALCIUM 9.3 9.0 9.3  PROT 5.6*  --   --   BILITOT 0.7  --   --   ALKPHOS 54  --   --   ALT 11*  --   --   AST 28  --   --   GLUCOSE 114* 127* 119*    Imaging/Diagnostic Tests: Dg Chest Port 1 View  Result Date: 06/07/2017 CLINICAL DATA:  Onset of dizziness this morning. EXAM: PORTABLE CHEST 1 VIEW COMPARISON:  PA and lateral chest 10/22/2015. FINDINGS: Mild left basilar atelectasis is seen. The lungs are otherwise clear. Heart size is upper normal. No pneumothorax or pleural effusion. Aortic atherosclerosis noted. No acute bony abnormality. IMPRESSION: No acute disease. Electronically Signed   By: Inge Rise M.D.   On: 06/07/2017 17:13     Sherene Sires, DO 06/10/2017, 6:17 AM PGY-1, [place] Intern pager: [phone], text pages welcome

## 2017-06-10 NOTE — Progress Notes (Signed)
Pt has orders to be discharged. Discharge instructions given and pt has no additional questions at this time. Medication regimen reviewed and pt educated. Pt verbalized understanding and has no additional questions. Telemetry box removed. IV removed and site in good condition. Pt stable and waiting for transportation via [facility].

## 2017-06-10 NOTE — Care Management Note (Addendum)
Case Management Note  Patient Details  Name: Shirley Strickland MRN: [id number] Date of Birth: February 23, 1916  Subjective/Objective:     Atrial Fibrillation               Action/Plan: Patient lives at home alone; Primary Care Provider: Alvester Chou, NP - she makes home visits; has private insurance with Medicare / [employer] with prescription drug coverage; pharmacy of choice is [facility]; she is active with [facility] for [facility] / PT; DME - walker, rollater and cane at home; patient is requesting an ambulance for transportation home; home address verified - [facility] non emergent ambulance called;  Expected Discharge Date:  06/10/17               Expected Discharge Plan:  [place]  Discharge planning Services  CM Consult  HH Arranged:  RN, PT [facility] Agency:  [facility]  Status of Service:  In process, will continue to follow  Sherrilyn Rist [phone] 06/10/2017, 10:14 AM

## 2017-06-11 DIAGNOSIS — E039 Hypothyroidism, unspecified: Secondary | ICD-10-CM

## 2017-08-20 ENCOUNTER — Inpatient Hospital Stay (HOSPITAL_COMMUNITY): Payer: Medicare Other | Admitting: Certified Registered"

## 2017-08-20 ENCOUNTER — Emergency Department (HOSPITAL_COMMUNITY): Payer: Medicare Other

## 2017-08-20 ENCOUNTER — Inpatient Hospital Stay (HOSPITAL_COMMUNITY)
Admission: EM | Admit: 2017-08-20 | Discharge: 2017-08-29 | DRG: 493 | Disposition: A | Discharge status: Skilled Nursing Facility | Payer: Medicare Other | Attending: Oncology | Admitting: Oncology

## 2017-08-20 ENCOUNTER — Encounter (HOSPITAL_COMMUNITY): Payer: Self-pay

## 2017-08-20 ENCOUNTER — Other Ambulatory Visit: Payer: Self-pay

## 2017-08-20 ENCOUNTER — Inpatient Hospital Stay (HOSPITAL_COMMUNITY): Payer: Medicare Other

## 2017-08-20 ENCOUNTER — Encounter (HOSPITAL_COMMUNITY)
Admission: EM | Disposition: A | Discharge status: Skilled Nursing Facility | Payer: Self-pay | Source: Home / Self Care | Attending: Oncology

## 2017-08-20 DIAGNOSIS — D62 Acute posthemorrhagic anemia: Secondary | ICD-10-CM | POA: Diagnosis not present

## 2017-08-20 DIAGNOSIS — Z95828 Presence of other vascular implants and grafts: Secondary | ICD-10-CM | POA: Diagnosis not present

## 2017-08-20 DIAGNOSIS — D631 Anemia in chronic kidney disease: Secondary | ICD-10-CM

## 2017-08-20 DIAGNOSIS — R0602 Shortness of breath: Secondary | ICD-10-CM | POA: Diagnosis not present

## 2017-08-20 DIAGNOSIS — Z86718 Personal history of other venous thrombosis and embolism: Secondary | ICD-10-CM

## 2017-08-20 DIAGNOSIS — W050XXA Fall from non-moving wheelchair, initial encounter: Secondary | ICD-10-CM | POA: Diagnosis not present

## 2017-08-20 DIAGNOSIS — T17908A Unspecified foreign body in respiratory tract, part unspecified causing other injury, initial encounter: Secondary | ICD-10-CM

## 2017-08-20 DIAGNOSIS — J9811 Atelectasis: Secondary | ICD-10-CM | POA: Diagnosis not present

## 2017-08-20 DIAGNOSIS — Z885 Allergy status to narcotic agent status: Secondary | ICD-10-CM | POA: Diagnosis not present

## 2017-08-20 DIAGNOSIS — Z7982 Long term (current) use of aspirin: Secondary | ICD-10-CM

## 2017-08-20 DIAGNOSIS — I10 Essential (primary) hypertension: Secondary | ICD-10-CM | POA: Diagnosis not present

## 2017-08-20 DIAGNOSIS — F1099 Alcohol use, unspecified with unspecified alcohol-induced disorder: Secondary | ICD-10-CM | POA: Diagnosis not present

## 2017-08-20 DIAGNOSIS — N179 Acute kidney failure, unspecified: Secondary | ICD-10-CM | POA: Diagnosis not present

## 2017-08-20 DIAGNOSIS — T17990A Other foreign object in respiratory tract, part unspecified in causing asphyxiation, initial encounter: Secondary | ICD-10-CM | POA: Diagnosis not present

## 2017-08-20 DIAGNOSIS — E860 Dehydration: Secondary | ICD-10-CM | POA: Diagnosis present

## 2017-08-20 DIAGNOSIS — Z23 Encounter for immunization: Secondary | ICD-10-CM

## 2017-08-20 DIAGNOSIS — M80871A Other osteoporosis with current pathological fracture, right ankle and foot, initial encounter for fracture: Secondary | ICD-10-CM | POA: Diagnosis not present

## 2017-08-20 DIAGNOSIS — Z515 Encounter for palliative care: Secondary | ICD-10-CM | POA: Diagnosis present

## 2017-08-20 DIAGNOSIS — R509 Fever, unspecified: Secondary | ICD-10-CM

## 2017-08-20 DIAGNOSIS — T368X5A Adverse effect of other systemic antibiotics, initial encounter: Secondary | ICD-10-CM | POA: Diagnosis not present

## 2017-08-20 DIAGNOSIS — Z880 Allergy status to penicillin: Secondary | ICD-10-CM

## 2017-08-20 DIAGNOSIS — R0600 Dyspnea, unspecified: Secondary | ICD-10-CM

## 2017-08-20 DIAGNOSIS — I252 Old myocardial infarction: Secondary | ICD-10-CM | POA: Diagnosis not present

## 2017-08-20 DIAGNOSIS — I509 Heart failure, unspecified: Secondary | ICD-10-CM | POA: Diagnosis present

## 2017-08-20 DIAGNOSIS — I214 Non-ST elevation (NSTEMI) myocardial infarction: Secondary | ICD-10-CM | POA: Diagnosis not present

## 2017-08-20 DIAGNOSIS — M810 Age-related osteoporosis without current pathological fracture: Secondary | ICD-10-CM | POA: Diagnosis not present

## 2017-08-20 DIAGNOSIS — S82899A Other fracture of unspecified lower leg, initial encounter for closed fracture: Secondary | ICD-10-CM

## 2017-08-20 DIAGNOSIS — K219 Gastro-esophageal reflux disease without esophagitis: Secondary | ICD-10-CM | POA: Diagnosis present

## 2017-08-20 DIAGNOSIS — L298 Other pruritus: Secondary | ICD-10-CM | POA: Diagnosis not present

## 2017-08-20 DIAGNOSIS — Z881 Allergy status to other antibiotic agents status: Secondary | ICD-10-CM | POA: Diagnosis not present

## 2017-08-20 DIAGNOSIS — Z888 Allergy status to other drugs, medicaments and biological substances status: Secondary | ICD-10-CM | POA: Diagnosis not present

## 2017-08-20 DIAGNOSIS — Y92002 Bathroom of unspecified non-institutional (private) residence single-family (private) house as the place of occurrence of the external cause: Secondary | ICD-10-CM

## 2017-08-20 DIAGNOSIS — I959 Hypotension, unspecified: Secondary | ICD-10-CM

## 2017-08-20 DIAGNOSIS — I4891 Unspecified atrial fibrillation: Secondary | ICD-10-CM | POA: Diagnosis not present

## 2017-08-20 DIAGNOSIS — F419 Anxiety disorder, unspecified: Secondary | ICD-10-CM | POA: Diagnosis present

## 2017-08-20 DIAGNOSIS — Z419 Encounter for procedure for purposes other than remedying health state, unspecified: Secondary | ICD-10-CM

## 2017-08-20 DIAGNOSIS — F0390 Unspecified dementia without behavioral disturbance: Secondary | ICD-10-CM | POA: Diagnosis present

## 2017-08-20 DIAGNOSIS — Z882 Allergy status to sulfonamides status: Secondary | ICD-10-CM

## 2017-08-20 DIAGNOSIS — L299 Pruritus, unspecified: Secondary | ICD-10-CM | POA: Diagnosis not present

## 2017-08-20 DIAGNOSIS — E039 Hypothyroidism, unspecified: Secondary | ICD-10-CM | POA: Diagnosis not present

## 2017-08-20 DIAGNOSIS — N184 Chronic kidney disease, stage 4 (severe): Secondary | ICD-10-CM

## 2017-08-20 DIAGNOSIS — Z79899 Other long term (current) drug therapy: Secondary | ICD-10-CM | POA: Diagnosis not present

## 2017-08-20 DIAGNOSIS — S82851C Displaced trimalleolar fracture of right lower leg, initial encounter for open fracture type IIIA, IIIB, or IIIC: Principal | ICD-10-CM | POA: Diagnosis present

## 2017-08-20 DIAGNOSIS — S82891C Other fracture of right lower leg, initial encounter for open fracture type IIIA, IIIB, or IIIC: Secondary | ICD-10-CM

## 2017-08-20 DIAGNOSIS — Z7952 Long term (current) use of systemic steroids: Secondary | ICD-10-CM | POA: Diagnosis not present

## 2017-08-20 DIAGNOSIS — W19XXXD Unspecified fall, subsequent encounter: Secondary | ICD-10-CM | POA: Diagnosis not present

## 2017-08-20 DIAGNOSIS — Z66 Do not resuscitate: Secondary | ICD-10-CM | POA: Diagnosis present

## 2017-08-20 DIAGNOSIS — D500 Iron deficiency anemia secondary to blood loss (chronic): Secondary | ICD-10-CM | POA: Diagnosis not present

## 2017-08-20 DIAGNOSIS — S82851F Displaced trimalleolar fracture of right lower leg, subsequent encounter for open fracture type IIIA, IIIB, or IIIC with routine healing: Secondary | ICD-10-CM | POA: Diagnosis not present

## 2017-08-20 DIAGNOSIS — D649 Anemia, unspecified: Secondary | ICD-10-CM | POA: Diagnosis not present

## 2017-08-20 DIAGNOSIS — T50905A Adverse effect of unspecified drugs, medicaments and biological substances, initial encounter: Secondary | ICD-10-CM

## 2017-08-20 DIAGNOSIS — Z85828 Personal history of other malignant neoplasm of skin: Secondary | ICD-10-CM

## 2017-08-20 DIAGNOSIS — X58XXXA Exposure to other specified factors, initial encounter: Secondary | ICD-10-CM | POA: Diagnosis not present

## 2017-08-20 DIAGNOSIS — I110 Hypertensive heart disease with heart failure: Secondary | ICD-10-CM | POA: Diagnosis not present

## 2017-08-20 DIAGNOSIS — I2510 Atherosclerotic heart disease of native coronary artery without angina pectoris: Secondary | ICD-10-CM | POA: Diagnosis not present

## 2017-08-20 DIAGNOSIS — Z79890 Hormone replacement therapy (postmenopausal): Secondary | ICD-10-CM | POA: Diagnosis not present

## 2017-08-20 DIAGNOSIS — S82851D Displaced trimalleolar fracture of right lower leg, subsequent encounter for closed fracture with routine healing: Secondary | ICD-10-CM | POA: Diagnosis not present

## 2017-08-20 DIAGNOSIS — Z886 Allergy status to analgesic agent status: Secondary | ICD-10-CM | POA: Diagnosis not present

## 2017-08-20 HISTORY — PX: I & D EXTREMITY: SHX5045

## 2017-08-20 HISTORY — DX: Other fracture of unspecified lower leg, initial encounter for open fracture type I or II: S82.899B

## 2017-08-20 LAB — BASIC METABOLIC PANEL
Anion gap: 11 (ref 5–15)
BUN: 16 mg/dL (ref 6–20)
CHLORIDE: 99 mmol/L — AB (ref 101–111)
CO2: 26 mmol/L (ref 22–32)
Calcium: 9.7 mg/dL (ref 8.9–10.3)
Creatinine, Ser: 0.93 mg/dL (ref 0.44–1.00)
GFR calc non Af Amer: 49 mL/min — ABNORMAL LOW (ref >60)
GFR, EST AFRICAN AMERICAN: 57 mL/min — AB (ref >60)
Glucose, Bld: 126 mg/dL — ABNORMAL HIGH (ref 65–99)
POTASSIUM: 4.1 mmol/L (ref 3.5–5.1)
SODIUM: 136 mmol/L (ref 135–145)

## 2017-08-20 LAB — CBC WITH DIFFERENTIAL/PLATELET
BASOS ABS: 0 10*3/uL (ref 0.0–0.1)
BASOS PCT: 0 %
EOS ABS: 0.7 10*3/uL (ref 0.0–0.7)
Eosinophils Relative: 7 %
HCT: 32.8 % — ABNORMAL LOW (ref 36.0–46.0)
HEMOGLOBIN: 10.5 g/dL — AB (ref 12.0–15.0)
LYMPHS PCT: 38 %
Lymphs Abs: 4 10*3/uL (ref 0.7–4.0)
MCH: 27.6 pg (ref 26.0–34.0)
MCHC: 32 g/dL (ref 30.0–36.0)
MCV: 86.3 fL (ref 78.0–100.0)
Monocytes Absolute: 0.8 10*3/uL (ref 0.1–1.0)
Monocytes Relative: 8 %
NEUTROS PCT: 47 %
Neutro Abs: 5 10*3/uL (ref 1.7–7.7)
Platelets: 258 10*3/uL (ref 150–400)
RBC: 3.8 MIL/uL — AB (ref 3.87–5.11)
RDW: 14.2 % (ref 11.5–15.5)
WBC: 10.5 10*3/uL (ref 4.0–10.5)

## 2017-08-20 LAB — TYPE AND SCREEN
ABO/RH(D): A NEG
ANTIBODY SCREEN: NEGATIVE

## 2017-08-20 LAB — I-STAT CHEM 8, ED
BUN: 17 mg/dL (ref 6–20)
Calcium, Ion: 1.21 mmol/L (ref 1.15–1.40)
Chloride: 100 mmol/L — ABNORMAL LOW (ref 101–111)
Creatinine, Ser: 0.9 mg/dL (ref 0.44–1.00)
Glucose, Bld: 126 mg/dL — ABNORMAL HIGH (ref 65–99)
HEMATOCRIT: 32 % — AB (ref 36.0–46.0)
HEMOGLOBIN: 10.9 g/dL — AB (ref 12.0–15.0)
Potassium: 4.1 mmol/L (ref 3.5–5.1)
SODIUM: 135 mmol/L (ref 135–145)
TCO2: 26 mmol/L (ref 22–32)

## 2017-08-20 SURGERY — IRRIGATION AND DEBRIDEMENT EXTREMITY
Anesthesia: Regional | Site: Ankle | Laterality: Right

## 2017-08-20 MED ORDER — VITAMIN B-12 1000 MCG PO TABS: 1000.0000 ug | ORAL_TABLET | Freq: Every day | ORAL | Status: DC

## 2017-08-20 MED ORDER — SENNOSIDES-DOCUSATE SODIUM 8.6-50 MG PO TABS
1.0000 | ORAL_TABLET | Freq: Two times a day (BID) | ORAL | Status: DC
  Administered 2017-08-21 – 2017-08-29 (×17): 1 via ORAL
  Filled 2017-08-20 (×18): qty 1

## 2017-08-20 MED ORDER — CLINDAMYCIN PHOSPHATE 600 MG/50ML IV SOLN
600.0000 mg | Freq: Three times a day (TID) | INTRAVENOUS | Status: DC
  Administered 2017-08-20: 600 mg via INTRAVENOUS
  Filled 2017-08-20 (×3): qty 50

## 2017-08-20 MED ORDER — KETAMINE HCL 10 MG/ML IJ SOLN
INTRAMUSCULAR | Status: DC | PRN
  Administered 2017-08-20: 20 mg via INTRAVENOUS

## 2017-08-20 MED ORDER — FENTANYL CITRATE (PF) 250 MCG/5ML IJ SOLN
INTRAMUSCULAR | Status: AC
  Filled 2017-08-20: qty 5

## 2017-08-20 MED ORDER — BUPIVACAINE-EPINEPHRINE (PF) 0.5% -1:200000 IJ SOLN
INTRAMUSCULAR | Status: DC | PRN
  Administered 2017-08-20: 30 mL via PERINEURAL

## 2017-08-20 MED ORDER — FOLIC ACID 1 MG PO TABS: 1.0000 mg | ORAL_TABLET | Freq: Every day | ORAL | Status: DC

## 2017-08-20 MED ORDER — SENNOSIDES-DOCUSATE SODIUM 8.6-50 MG PO TABS: 1.0000 | ORAL_TABLET | Freq: Two times a day (BID) | ORAL | Status: DC

## 2017-08-20 MED ORDER — CLINDAMYCIN PHOSPHATE 900 MG/50ML IV SOLN
INTRAVENOUS | Status: AC
  Filled 2017-08-20: qty 50

## 2017-08-20 MED ORDER — ASPIRIN EC 81 MG PO TBEC
81.0000 mg | DELAYED_RELEASE_TABLET | Freq: Every day | ORAL | Status: DC
  Administered 2017-08-21: 81 mg via ORAL
  Filled 2017-08-20: qty 1

## 2017-08-20 MED ORDER — CLINDAMYCIN PHOSPHATE 900 MG/50ML IV SOLN: 900.0000 mg | INTRAVENOUS | Status: DC

## 2017-08-20 MED ORDER — POVIDONE-IODINE 10 % EX SWAB: 2.0000 "application " | Freq: Once | CUTANEOUS | Status: DC

## 2017-08-20 MED ORDER — HYDROMORPHONE HCL 1 MG/ML IJ SOLN: 0.2500 mg | INTRAMUSCULAR | Status: DC | PRN

## 2017-08-20 MED ORDER — HYDROMORPHONE HCL 1 MG/ML IJ SOLN
0.5000 mg | Freq: Once | INTRAMUSCULAR | Status: AC
  Administered 2017-08-20: 0.5 mg via INTRAVENOUS
  Filled 2017-08-20: qty 1

## 2017-08-20 MED ORDER — MIDAZOLAM HCL 2 MG/2ML IJ SOLN
INTRAMUSCULAR | Status: AC
  Administered 2017-08-20: 2 mg
  Filled 2017-08-20: qty 2

## 2017-08-20 MED ORDER — CEFAZOLIN SODIUM-DEXTROSE 2-4 GM/100ML-% IV SOLN: 2.0000 g | Freq: Three times a day (TID) | INTRAVENOUS | Status: DC

## 2017-08-20 MED ORDER — CHLORHEXIDINE GLUCONATE 4 % EX LIQD: 60.0000 mL | Freq: Once | CUTANEOUS | Status: DC

## 2017-08-20 MED ORDER — ALPRAZOLAM 0.5 MG PO TABS: 0.5000 mg | ORAL_TABLET | Freq: Three times a day (TID) | ORAL | Status: DC | PRN

## 2017-08-20 MED ORDER — FENTANYL CITRATE (PF) 100 MCG/2ML IJ SOLN
INTRAMUSCULAR | Status: DC | PRN
  Administered 2017-08-20: 25 ug via INTRAVENOUS

## 2017-08-20 MED ORDER — FOLIC ACID 1 MG PO TABS
1.0000 mg | ORAL_TABLET | Freq: Every day | ORAL | Status: DC
  Administered 2017-08-21 – 2017-08-29 (×9): 1 mg via ORAL
  Filled 2017-08-20 (×9): qty 1

## 2017-08-20 MED ORDER — ONDANSETRON HCL 4 MG/2ML IJ SOLN: 4.0000 mg | Freq: Once | INTRAMUSCULAR | Status: DC | PRN

## 2017-08-20 MED ORDER — VITAMIN D 1000 UNITS PO TABS
2000.0000 [IU] | ORAL_TABLET | Freq: Every day | ORAL | Status: DC
  Administered 2017-08-21 – 2017-08-29 (×9): 2000 [IU] via ORAL
  Filled 2017-08-20 (×10): qty 2

## 2017-08-20 MED ORDER — METOCLOPRAMIDE HCL 5 MG/ML IJ SOLN: 5.0000 mg | Freq: Three times a day (TID) | INTRAMUSCULAR | Status: DC | PRN

## 2017-08-20 MED ORDER — MIDAZOLAM HCL 2 MG/2ML IJ SOLN
INTRAMUSCULAR | Status: AC
  Filled 2017-08-20: qty 2

## 2017-08-20 MED ORDER — VITAMIN B-12 1000 MCG PO TABS
1000.0000 ug | ORAL_TABLET | Freq: Every day | ORAL | Status: DC
  Administered 2017-08-21 – 2017-08-29 (×9): 1000 ug via ORAL
  Filled 2017-08-20 (×9): qty 1

## 2017-08-20 MED ORDER — FENTANYL CITRATE (PF) 100 MCG/2ML IJ SOLN
100.0000 ug | Freq: Once | INTRAMUSCULAR | Status: AC
  Administered 2017-08-20: 100 ug via INTRAVENOUS
  Filled 2017-08-20: qty 2

## 2017-08-20 MED ORDER — MEPERIDINE HCL 25 MG/ML IJ SOLN: 6.2500 mg | INTRAMUSCULAR | Status: DC | PRN

## 2017-08-20 MED ORDER — VITAMIN D 1000 UNITS PO TABS: 1000.0000 [IU] | ORAL_TABLET | Freq: Every day | ORAL | Status: DC

## 2017-08-20 MED ORDER — LIDOCAINE-EPINEPHRINE (PF) 1.5 %-1:200000 IJ SOLN
INTRAMUSCULAR | Status: DC | PRN
  Administered 2017-08-20: 30 mL via PERINEURAL

## 2017-08-20 MED ORDER — ONDANSETRON HCL 4 MG/2ML IJ SOLN: 4.0000 mg | Freq: Once | INTRAMUSCULAR | Status: DC

## 2017-08-20 MED ORDER — CLINDAMYCIN PHOSPHATE 900 MG/50ML IV SOLN
900.0000 mg | Freq: Once | INTRAVENOUS | Status: AC
  Administered 2017-08-20: 900 mg via INTRAVENOUS
  Filled 2017-08-20: qty 50

## 2017-08-20 MED ORDER — ONDANSETRON HCL 4 MG/2ML IJ SOLN
INTRAMUSCULAR | Status: DC | PRN
  Administered 2017-08-20: 4 mg via INTRAVENOUS

## 2017-08-20 MED ORDER — SODIUM CHLORIDE 0.9 % IR SOLN
Status: DC | PRN
  Administered 2017-08-20: 1000 mL

## 2017-08-20 MED ORDER — CLINDAMYCIN PHOSPHATE 900 MG/50ML IV SOLN
INTRAVENOUS | Status: DC | PRN
  Administered 2017-08-20: 900 mg via INTRAVENOUS

## 2017-08-20 MED ORDER — FENTANYL CITRATE (PF) 100 MCG/2ML IJ SOLN: 12.5000 ug | INTRAMUSCULAR | Status: DC | PRN

## 2017-08-20 MED ORDER — FENTANYL 12 MCG/HR TD PT72: 12.5000 ug | MEDICATED_PATCH | TRANSDERMAL | Status: DC

## 2017-08-20 MED ORDER — PROPOFOL 500 MG/50ML IV EMUL
INTRAVENOUS | Status: DC | PRN
  Administered 2017-08-20: 100 ug/kg/min via INTRAVENOUS

## 2017-08-20 MED ORDER — FENTANYL CITRATE (PF) 100 MCG/2ML IJ SOLN
100.0000 ug | Freq: Once | INTRAMUSCULAR | Status: AC
  Administered 2017-08-20: 100 ug via INTRAVENOUS

## 2017-08-20 MED ORDER — LEVOTHYROXINE SODIUM 88 MCG PO TABS
44.0000 ug | ORAL_TABLET | ORAL | Status: DC
  Administered 2017-08-22 – 2017-08-29 (×7): 44 ug via ORAL
  Filled 2017-08-20 (×7): qty 1

## 2017-08-20 MED ORDER — METOPROLOL TARTRATE 12.5 MG HALF TABLET
12.5000 mg | ORAL_TABLET | Freq: Two times a day (BID) | ORAL | Status: DC
  Administered 2017-08-21 – 2017-08-27 (×12): 12.5 mg via ORAL
  Filled 2017-08-20 (×14): qty 1

## 2017-08-20 MED ORDER — VITAMIN B-1 100 MG PO TABS
100.0000 mg | ORAL_TABLET | Freq: Every day | ORAL | Status: DC
  Administered 2017-08-21 – 2017-08-29 (×9): 100 mg via ORAL
  Filled 2017-08-20 (×9): qty 1

## 2017-08-20 MED ORDER — TETANUS-DIPHTH-ACELL PERTUSSIS 5-2.5-18.5 LF-MCG/0.5 IM SUSP
0.5000 mL | Freq: Once | INTRAMUSCULAR | Status: AC
  Administered 2017-08-20: 0.5 mL via INTRAMUSCULAR
  Filled 2017-08-20: qty 0.5

## 2017-08-20 MED ORDER — ALPRAZOLAM 0.5 MG PO TABS
0.5000 mg | ORAL_TABLET | Freq: Three times a day (TID) | ORAL | Status: DC | PRN
  Administered 2017-08-22: 0.5 mg via ORAL
  Filled 2017-08-20 (×2): qty 1

## 2017-08-20 MED ORDER — METOCLOPRAMIDE HCL 5 MG/ML IJ SOLN
5.0000 mg | Freq: Once | INTRAMUSCULAR | Status: AC
  Administered 2017-08-20: 5 mg via INTRAVENOUS
  Filled 2017-08-20: qty 2

## 2017-08-20 MED ORDER — FENTANYL CITRATE (PF) 100 MCG/2ML IJ SOLN
12.5000 ug | INTRAMUSCULAR | Status: DC | PRN
  Administered 2017-08-21 (×2): 12.5 ug via INTRAVENOUS
  Filled 2017-08-20 (×2): qty 2

## 2017-08-20 MED ORDER — KETAMINE HCL-SODIUM CHLORIDE 100-0.9 MG/10ML-% IV SOSY
PREFILLED_SYRINGE | INTRAVENOUS | Status: AC
  Filled 2017-08-20: qty 10

## 2017-08-20 MED ORDER — ADULT MULTIVITAMIN W/MINERALS CH
1.0000 | ORAL_TABLET | Freq: Every day | ORAL | Status: DC
  Administered 2017-08-21 – 2017-08-29 (×9): 1 via ORAL
  Filled 2017-08-20 (×10): qty 1

## 2017-08-20 MED ORDER — LACTATED RINGERS IV SOLN
INTRAVENOUS | Status: DC
  Administered 2017-08-20 – 2017-08-22 (×2): via INTRAVENOUS

## 2017-08-20 MED ORDER — FENTANYL CITRATE (PF) 100 MCG/2ML IJ SOLN
INTRAMUSCULAR | Status: AC
  Administered 2017-08-20: 100 ug via INTRAVENOUS
  Filled 2017-08-20: qty 2

## 2017-08-20 SURGICAL SUPPLY — 82 items
BANDAGE ACE 3X5.8 VEL STRL LF (GAUZE/BANDAGES/DRESSINGS) IMPLANT
BANDAGE ACE 4X5 VEL STRL LF (GAUZE/BANDAGES/DRESSINGS) ×4 IMPLANT
BANDAGE ACE 6X5 VEL STRL LF (GAUZE/BANDAGES/DRESSINGS) ×4 IMPLANT
BANDAGE ELASTIC 6 VELCRO ST LF (GAUZE/BANDAGES/DRESSINGS) ×6 IMPLANT
BANDAGE ESMARK 6X9 LF (GAUZE/BANDAGES/DRESSINGS) IMPLANT
BLADE SURG 15 STRL LF DISP TIS (BLADE) ×2 IMPLANT
BLADE SURG 15 STRL SS (BLADE) ×4
BNDG CMPR 9X6 STRL LF SNTH (GAUZE/BANDAGES/DRESSINGS)
BNDG COHESIVE 1X5 TAN STRL LF (GAUZE/BANDAGES/DRESSINGS) IMPLANT
BNDG COHESIVE 4X5 TAN STRL (GAUZE/BANDAGES/DRESSINGS) ×4 IMPLANT
BNDG COHESIVE 6X5 TAN STRL LF (GAUZE/BANDAGES/DRESSINGS) ×8 IMPLANT
BNDG CONFORM 3 STRL LF (GAUZE/BANDAGES/DRESSINGS) IMPLANT
BNDG ESMARK 6X9 LF (GAUZE/BANDAGES/DRESSINGS)
BNDG GAUZE ELAST 4 BULKY (GAUZE/BANDAGES/DRESSINGS) ×8 IMPLANT
BNDG GAUZE STRTCH 6 (GAUZE/BANDAGES/DRESSINGS) ×4 IMPLANT
BRUSH SCRUB EZ PLAIN DRY (MISCELLANEOUS) ×3 IMPLANT
CANISTER SUCT 3000ML PPV (MISCELLANEOUS) ×4 IMPLANT
CORDS BIPOLAR (ELECTRODE) IMPLANT
COVER SURGICAL LIGHT HANDLE (MISCELLANEOUS) ×4 IMPLANT
CUFF TOURNIQUET SINGLE 24IN (TOURNIQUET CUFF) IMPLANT
CUFF TOURNIQUET SINGLE 34IN LL (TOURNIQUET CUFF) IMPLANT
CUFF TOURNIQUET SINGLE 44IN (TOURNIQUET CUFF) IMPLANT
DRAPE C-ARM 42X72 X-RAY (DRAPES) ×4 IMPLANT
DRAPE C-ARMOR (DRAPES) ×4 IMPLANT
DRAPE INCISE IOBAN 66X45 STRL (DRAPES) IMPLANT
DRAPE U-SHAPE 47X51 STRL (DRAPES) ×4 IMPLANT
DRSG PAD ABDOMINAL 8X10 ST (GAUZE/BANDAGES/DRESSINGS) ×6 IMPLANT
DURAPREP 26ML APPLICATOR (WOUND CARE) ×4 IMPLANT
ELECT CAUTERY BLADE 6.4 (BLADE) ×4 IMPLANT
ELECT REM PT RETURN 9FT ADLT (ELECTROSURGICAL) ×4
ELECTRODE REM PT RTRN 9FT ADLT (ELECTROSURGICAL) ×2 IMPLANT
GAUZE SPONGE 4X4 12PLY STRL (GAUZE/BANDAGES/DRESSINGS) ×5 IMPLANT
GAUZE XEROFORM 5X9 LF (GAUZE/BANDAGES/DRESSINGS) ×10 IMPLANT
GLOVE SKINSENSE NS SZ7.5 (GLOVE) ×4
GLOVE SKINSENSE STRL SZ7.5 (GLOVE) ×4 IMPLANT
GLOVE SURG SYN 7.5  E (GLOVE) ×4
GLOVE SURG SYN 7.5 E (GLOVE) ×4 IMPLANT
GLOVE SURG SYN 7.5 PF PI (GLOVE) ×2 IMPLANT
GOWN STRL REIN XL XLG (GOWN DISPOSABLE) ×8 IMPLANT
HANDPIECE INTERPULSE COAX TIP (DISPOSABLE) ×4
KIT BASIN OR (CUSTOM PROCEDURE TRAY) ×4 IMPLANT
KIT ROOM TURNOVER OR (KITS) ×4 IMPLANT
MANIFOLD NEPTUNE II (INSTRUMENTS) ×4 IMPLANT
NDL HYPO 25GX1X1/2 BEV (NEEDLE) IMPLANT
NEEDLE 22X1 1/2 (OR ONLY) (NEEDLE) IMPLANT
NEEDLE HYPO 25GX1X1/2 BEV (NEEDLE) IMPLANT
NS IRRIG 1000ML POUR BTL (IV SOLUTION) ×4 IMPLANT
PACK ORTHO EXTREMITY (CUSTOM PROCEDURE TRAY) ×4 IMPLANT
PAD ABD 8X10 STRL (GAUZE/BANDAGES/DRESSINGS) ×4 IMPLANT
PAD ARMBOARD 7.5X6 YLW CONV (MISCELLANEOUS) ×8 IMPLANT
PAD CAST 4YDX4 CTTN HI CHSV (CAST SUPPLIES) ×1 IMPLANT
PADDING CAST ABS 4INX4YD NS (CAST SUPPLIES) ×2
PADDING CAST ABS COTTON 4X4 ST (CAST SUPPLIES) ×2 IMPLANT
PADDING CAST COTTON 4X4 STRL (CAST SUPPLIES) ×4
PADDING CAST COTTON 6X4 STRL (CAST SUPPLIES) ×6 IMPLANT
SET HNDPC FAN SPRY TIP SCT (DISPOSABLE) ×1 IMPLANT
SET IRRIG Y TYPE TUR BLADDER L (SET/KITS/TRAYS/PACK) ×3 IMPLANT
SPLINT FIBERGLASS 3X35 (CAST SUPPLIES) ×3 IMPLANT
SPLINT FIBERGLASS 4X30 (CAST SUPPLIES) ×3 IMPLANT
SPONGE LAP 18X18 X RAY DECT (DISPOSABLE) ×4 IMPLANT
STAPLER VISISTAT 35W (STAPLE) IMPLANT
STOCKINETTE IMPERVIOUS 9X36 MD (GAUZE/BANDAGES/DRESSINGS) ×4 IMPLANT
STOCKINETTE IMPERVIOUS LG (DRAPES) ×4 IMPLANT
SUCTION FRAZIER HANDLE 10FR (MISCELLANEOUS) ×2
SUCTION TUBE FRAZIER 10FR DISP (MISCELLANEOUS) ×2 IMPLANT
SUT ETHILON 2 0 FS 18 (SUTURE) ×9 IMPLANT
SUT ETHILON 2 0 PSLX (SUTURE) ×4 IMPLANT
SUT ETHILON 3 0 PS 1 (SUTURE) IMPLANT
SUT VIC AB 2-0 CT1 27 (SUTURE)
SUT VIC AB 2-0 CT1 TAPERPNT 27 (SUTURE) IMPLANT
SUT VIC AB 2-0 FS1 27 (SUTURE) ×2 IMPLANT
SWAB CULTURE ESWAB REG 1ML (MISCELLANEOUS) IMPLANT
SYR CONTROL 10ML LL (SYRINGE) IMPLANT
TOWEL OR 17X24 6PK STRL BLUE (TOWEL DISPOSABLE) ×8 IMPLANT
TOWEL OR 17X26 10 PK STRL BLUE (TOWEL DISPOSABLE) ×8 IMPLANT
TUBE CONNECTING 12'X1/4 (SUCTIONS) ×1
TUBE CONNECTING 12X1/4 (SUCTIONS) ×3 IMPLANT
TUBE FEEDING ENTERAL 5FR 16IN (TUBING) IMPLANT
TUBING CYSTO DISP (UROLOGICAL SUPPLIES) ×4 IMPLANT
UNDERPAD 30X30 (UNDERPADS AND DIAPERS) ×8 IMPLANT
WATER STERILE IRR 1000ML POUR (IV SOLUTION) ×8 IMPLANT
YANKAUER SUCT BULB TIP NO VENT (SUCTIONS) ×4 IMPLANT

## 2017-08-20 NOTE — Anesthesia Procedure Notes (Signed)
Anesthesia Regional Block: Popliteal block   Pre-Anesthetic Checklist: ,, timeout performed, Correct Patient, Correct Site, Correct Laterality, Correct Procedure, Correct Position, site marked, Risks and benefits discussed,  Surgical consent,  Pre-op evaluation,  At surgeon's request and post-op pain management  Laterality: Right  Prep: chloraprep       Needles:  Injection technique: Single-shot  Needle Type: Echogenic Stimulator Needle     Needle Length: 10cm  Needle Gauge: 21     Additional Needles:   Procedures:, nerve stimulator,,,,,,,   Nerve Stimulator or Paresthesia:  Response: 0.4 mA,   Additional Responses:   Narrative:  Start time: 08/20/2017 5:00 PM End time: 08/20/2017 5:10 PM Injection made incrementally with aspirations every 5 mL.  Performed by: Personally  Anesthesiologist: Lillia Abed, MD  Additional Notes: Monitors applied. Patient sedated. Sterile prep and drape,hand hygiene and sterile gloves were used. Relevant anatomy identified.Needle position confirmed.Local anesthetic injected incrementally after negative aspiration. Local anesthetic spread visualized around nerve(s). Vascular puncture avoided. No complications. Image printed for medical record.The patient tolerated the procedure well.  Additional Saphenous nerve block performed. 15cc Local Anesthetic mixture placed under ultrasonic guidance along the medio-inferior border of the Sartorious muscle 6 inches above the knee.  No Problems encountered.  Lillia Abed MD

## 2017-08-20 NOTE — Progress Notes (Signed)
[facility] Liaison:  RN visit  Spoke with Patty, [facility] RN, who advised that patient was coming in this morning.  I've talked with Loree Fee, bedside RN, x 2 today to get updated on patient condition and plan of care.  I went and met with patient and niece at bedside in room D36 in the emergency room.  Patient was reclined in stretcher and smiling when I came into the room.  Patient alert and oriented and she relayed her story of how she broke her ankle this morning.   Patient states is not in pain right now, but that she had been previously.  She advised that she did not call [facility] prior to coming into the ED, and that she was sorry, she could only "think to call [facility]".    Per niece, patient will be going to surgery to have the broken ankle possibly worked on, but there is a concern of whether patient can tolerate anesthesia for the surgery.  Anesthesia to come talk with patient.  Unsure if patient will be admitted prior to surgery or after the OR takes her.  Offered emotional support.  Patient and family appreciative of visit.   We will continue to follow patient while she is in the hospital.   Thank you.  Edyth Gunnels, RN, BSN [facility] Liaison [phone]  All hospital liaisons are now on [place].

## 2017-08-20 NOTE — ED Provider Notes (Signed)
[facility] EMERGENCY DEPARTMENT Provider Note   CSN: [id number] Arrival date & time: 08/20/17  1032     History   Chief Complaint Chief Complaint  Patient presents with  . Leg Injury    open ankle fx    HPI Shirley Strickland is a 83 y.o. female with history of atrial fibrillation on aspirin only presents to ED for evaluation of possible open fracture to the right ankle. Patient was witnessed to be ambulating in the bathroom and attempting to sit back on her wheelchair when she collapsed with her feet underneath her. She has obvious visible bone protruding through the skin. She denies numbness or tingling distally. She denies head trauma or LOC which is also confirmed by a niece who watched her fall. History of dementia, knees states she is at baseline. Patient has otherwise been at baseline state of health in the last few days.   HPI  Past Medical History:  Diagnosis Date  . A-fib ([place])   . 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 Bruise    bruise on left foot from fall 07/03/11  . Cancer [facility])    pt states only skin cancer  . CHF (congestive heart failure) ([place])   . DVT (deep venous thrombosis) ([place]) 12/28/2013   LEFT   . Fall   . GERD (gastroesophageal reflux disease)   . HTN (hypertension)   . Knee pain, acute 07/04/11   Pt states left knee twisted under her in fall 07/03/11 and now painful  . Numbness of legs    started after fall yesterday 07/03/11  . Osteopenia   . Pneumonia   . Rash and nonspecific skin eruption 07/04/11   rash on bilateral upper legs  . Syncope     Patient Active Problem List   Diagnosis Date Noted  . Hypothyroidism   . Elevated troponin   . AKI (acute kidney injury) ([place])   . Non-ST elevation MI (NSTEMI) ([place])   . Demand ischemia of myocardium ([place]) 06/08/2017  . Chest pain 06/07/2017  . DVT (deep venous thrombosis)-left 12/28/2013  . Acute deep vein thrombosis (DVT) of femoral vein of left lower extremity ([place]) 09/29/2013    . Anemia 09/29/2013  . Cellulitis 09/23/2013  . Hyponatremia 08/31/2013  . UTI (urinary tract infection) 08/31/2013  . Chronic diastolic heart failure ([place]) 08/25/2013  . Left fibular fracture 08/24/2013  . Laceration of left leg 08/23/2013  . Syncope and collapse 08/23/2013  . HTN (hypertension) 08/23/2013  . Fall at home 08/23/2013  . Sacral insufficiency fracture 07/07/2011  . Fall   . Atrial fibrillation with RVR [facility])     Past Surgical History:  Procedure Laterality Date  . APPENDECTOMY    . CATARACT EXTRACTION    . ELBOW SURGERY     right  . EXPLORATORY LAPAROTOMY  07/04/11   years ago to find cause of bleeding    OB History    No data available       Home Medications    Prior to Admission medications   Medication Sig Start Date End Date Taking? Authorizing Provider  aspirin EC 81 MG tablet Take 81 mg by mouth daily.    [provider]  levothyroxine (SYNTHROID, LEVOTHROID) 88 MCG tablet Take 44 mcg by mouth every morning. 05/19/17   [provider]  metoprolol tartrate (LOPRESSOR) 25 MG tablet Take 0.5 tablets (12.5 mg total) by mouth 2 (two) times daily. 06/10/17 07/10/17  Sherene Sires, DO  Family History No family history on file.  Social History Social History   Tobacco Use  . Smoking status: Never Smoker  . Smokeless tobacco: Never Used  Substance Use Topics  . Alcohol use: Yes    Alcohol/week: 1.2 oz    Types: 2 Cans of beer per week    Comment: 2 cans beer per day   12/28/2013  DRINKS ONLY  1 CAN A DAY  . Drug use: No     Allergies   Rocephin [ceftriaxone sodium in dextrose]; Clarithromycin; [place] [fish allergy]; Codeine; Darvocet [propoxyphene n-acetaminophen]; Demadex [torsemide]; Diovan hct [valsartan-hydrochlorothiazide]; Ex-lax [senna]; Ibuprofen; Indanediones; Indapamide; Morphine and related; Sulfa antibiotics; Benzonatate; Penicillins; and Septra [bactrim]   Review of Systems Review of Systems  Musculoskeletal:  Positive for arthralgias and gait problem.  Skin: Positive for wound.  All other systems reviewed and are negative.    Physical Exam Updated Vital Signs BP (!) 181/61   Pulse 90   Resp 13   Ht 5\' 3"  (1.6 m)   Wt 64 kg (141 lb)   SpO2 100%   BMI 24.98 kg/m   Physical Exam  Constitutional: She is oriented to person, place, and time. She appears well-developed and well-nourished. She is cooperative. She is easily aroused. No distress.  HENT:  No abrasions, lacerations, erythema or signs of facial or head injury. No scalp, facial or nasal bone tenderness.   Eyes:  Lids normal EOMs and PERRL intact without pain No conjunctival injection  Neck:  No cervical spinous process tenderness  Cardiovascular: Normal rate, regular rhythm, S1 normal, S2 normal and normal heart sounds. Exam reveals no distant heart sounds and no friction rub.  No murmur heard. Pulses:      Carotid pulses are 2+ on the right side, and 2+ on the left side.      Radial pulses are 2+ on the right side, and 2+ on the left side.       Dorsalis pedis pulses are 2+ on the right side, and 2+ on the left side.  2+ DP pulses bilaterally. Feet warm.   Pulmonary/Chest: Effort normal and breath sounds normal. No respiratory distress. She has no decreased breath sounds.  Abdominal: Soft. Bowel sounds are normal. There is no tenderness.  Musculoskeletal: Normal range of motion. She exhibits deformity.  Distal tibia protruding through open wound on left medial ankle, left foot externally rotated. Pt able to wiggle toes.   Neurological: She is alert, oriented to person, place, and time and easily aroused.  No dysarthria. AxO to self, place and event. Sensation to light touch intact in big toes bilaterally.      ED Treatments / Results  Labs (all labs ordered are listed, but only abnormal results are displayed) Labs Reviewed  CBC WITH DIFFERENTIAL/PLATELET - Abnormal; Notable for the following components:      Result  Value   RBC 3.80 (*)    Hemoglobin 10.5 (*)    HCT 32.8 (*)    All other components within normal limits  BASIC METABOLIC PANEL - Abnormal; Notable for the following components:   Chloride 99 (*)    Glucose, Bld 126 (*)    GFR calc non Af Amer 49 (*)    GFR calc Af Amer 57 (*)    All other components within normal limits  I-STAT CHEM 8, ED - Abnormal; Notable for the following components:   Chloride 100 (*)    Glucose, Bld 126 (*)    Hemoglobin 10.9 (*)  HCT 32.0 (*)    All other components within normal limits    EKG  EKG Interpretation  Date/Time:  Thursday August 20 2017 10:52:57 EST Ventricular Rate:  87 PR Interval:    QRS Duration: 101 QT Interval:  385 QTC Calculation: 464 R Axis:   26 Text Interpretation:  Sinus rhythm Atrial premature complexes Posterior infarct, old No significant change since last tracing Confirmed by Orlie Dakin [id number]) on 08/20/2017 11:28:19 AM       Radiology Dg Ankle 2 Views Right  Result Date: 08/20/2017 CLINICAL DATA:  O put right ankle fracture clinically sustained while trying to transfer from wheelchair to a recliner. EXAM: RIGHT ANKLE - 2 VIEW COMPARISON:  Right ankle series of September 28, 2013 FINDINGS: The patient has sustained a bimalleolar fracture dislocation of the right ankle. The distal fibular metadiaphyseal fracture is comminuted and markedly angulated and displaced. The medial malleolar fracture is not well defined. No definite posterior malleolar fracture is observed. There is diffuse soft tissue swelling. As best as can be determined the talus and calcaneus are intact. IMPRESSION: Probable bimalleolar fracture dislocation of the right ankle with disruption of the ankle joint mortise. Fine detail of the medial malleolar fracture is not available on this study. The distal fibular metadiaphyseal fracture exhibits angulation, comminution, and displacement. Electronically Signed   By: David  Martinique M.D.   On: 08/20/2017 11:08    Dg Ankle Right Port  Result Date: 08/20/2017 CLINICAL DATA:  Fracture dislocation of the right ankle. EXAM: PORTABLE RIGHT ANKLE - 2 VIEW 12:11 p.m. COMPARISON:  08/20/2017 10:41 a.m. FINDINGS: Images taking through plaster demonstrate that the dislocation has been reduced. Alignment and position of the fracture fragments is markedly improved. IMPRESSION: Reduction of the dislocation at the right ankle marked improvement in the alignment and position of fracture fragments. Electronically Signed   By: Lorriane Shire M.D.   On: 08/20/2017 12:35    Procedures Reduction of dislocation Date/Time: 08/20/2017 12:00 PM Performed by: Orlie Dakin, MD Authorized by: Orlie Dakin, MD  Consent: The procedure was performed in an emergent situation. Verbal consent obtained. Written consent not obtained. Risks and benefits: risks, benefits and alternatives were discussed Consent given by: patient (nieces at bedside) Patient understanding: patient states understanding of the procedure being performed Patient consent: the patient's understanding of the procedure matches consent given Procedure consent: procedure consent matches procedure scheduled Relevant documents: relevant documents present and verified Test results: test results available and properly labeled Site marked: the operative site was marked Imaging studies: imaging studies available Required items: required blood products, implants, devices, and special equipment available (portable x-ray, ortho tech, ortho/trauma PA, nursing and pharmacist at bedside) Patient identity confirmed: verbally with patient, arm band and hospital-assigned identification number Time out: Immediately prior to procedure a "time out" was called to verify the correct patient, procedure, equipment, support staff and site/side marked as required. Local anesthesia used: no  Anesthesia: Local anesthesia used: no  Sedation: Patient sedated: yes Sedation type:  moderate (conscious) sedation Sedatives: fentanyl and midazolam Analgesia: fentanyl Sedation start date/time: 08/20/2017 11:55 AM Vitals: Vital signs were monitored during sedation.  Patient tolerance: Patient tolerated the procedure well with no immediate complications Comments: Palpable DP pulses pre and post reduction  .Sedation Date/Time: 08/20/2017 12:09 PM Performed by: Orlie Dakin, MD Authorized by: Orlie Dakin, MD   Consent:    Consent obtained:  Written   Consent given by: niece.   Risks discussed:  Allergic reaction, dysrhythmia, inadequate sedation, nausea, vomiting,  prolonged sedation necessitating reversal and respiratory compromise necessitating ventilatory assistance and intubation   Alternatives discussed:  Anxiolysis Universal protocol:    Procedure explained and questions answered to patient or proxy's satisfaction: yes     Relevant documents present and verified: yes     Test results available and properly labeled: yes     Imaging studies available: yes     Required blood products, implants, devices, and special equipment available: yes     Site/side marked: yes     Immediately prior to procedure a time out was called: yes     Patient identity confirmation method:  Arm band and hospital-assigned identification number (self and by niece) Indications:    Procedure necessitating sedation performed by:  Physician performing sedation   Intended level of sedation:  Moderate (conscious sedation) Pre-sedation assessment:    Time since last food or drink:  Breakfast    NPO status caution: unable to specify NPO status     ASA classification: class 4 - patient with severe systemic disease that is a constant threat to life     Mallampati score:  III - soft palate, base of uvula visible   Pre-sedation assessments completed and reviewed: airway patency, nausea/vomiting, pain level and respiratory function   Procedure details (see MAR for exact dosages):     Preoxygenation:  Nasal cannula   Sedation:  Midazolam   Analgesia:  Fentanyl   Intra-procedure monitoring:  Blood pressure monitoring and cardiac monitor   Total Provider sedation time (minutes):  5   (including critical care time)  Medications Ordered in ED Medications  HYDROmorphone (DILAUDID) injection 0.5 mg (0.5 mg Intravenous Given 08/20/17 1050)  metoCLOPramide (REGLAN) injection 5 mg (5 mg Intravenous Given 08/20/17 1050)  midazolam (VERSED) 2 MG/2ML injection (2 mg  Given 08/20/17 1154)  clindamycin (CLEOCIN) IVPB 900 mg (900 mg Intravenous New Bag/Given 08/20/17 1220)  fentaNYL (SUBLIMAZE) injection 100 mcg (100 mcg Intravenous Given 08/20/17 1154)  Tdap (BOOSTRIX) injection 0.5 mL (0.5 mLs Intramuscular Given 08/20/17 1228)     Initial Impression / Assessment and Plan / ED Course  I have reviewed the triage vital signs and the nursing notes.  Pertinent labs & imaging results that were available during my care of the patient were reviewed by me and considered in my medical decision making (see chart for details).  Clinical Course as of Aug 20 1256  Thu Aug 20, 2017  1255 IMPRESSION: Probable bimalleolar fracture dislocation of the right ankle with disruption of the ankle joint mortise. Fine detail of the medial malleolar fracture is not available on this study. The distal fibular metadiaphyseal fracture exhibits angulation, comminution, and displacement. DG Ankle 2 Views Right [CG]  7915 VIMPRESSION: Reduction of the dislocation at the right ankle marked improvement in the alignment and position of fracture fragments.   DG Ankle Right Port [CG]    Clinical Course User Index [CG] Kinnie Feil, PA-C   83 year old female presents for deformity of the right ankle status post mechanical fall. Fall witnessed by family member who denies LOC or head trauma. Patient appears to be a baseline mental status. No anticoagulants.  On exam, patient has obvious open  fracture/dislocation. She has strong DP pulses and sensation to light touch is intact on this extremity.  Final Clinical Impressions(s) / ED Diagnoses   Initial x-ray shows probable bimalleolar fracture dislocation of the right ankle. Conscious sedation and reduction performed with assistance of supervising physician and ortho PA at bedside. Repeat portable x-ray  shows reduction of the dislocation of the right ankle with marked improvement in alignment Extremity neurovascularly intact pre-and post reduction. Extremity splinted by orthopedic tech and PA. Appreciate their assistance. Spoke to inpatient team team who will admit patient. Plan is for Dr. Ninfa Linden or Erlinda Hong to take pt to OR later today.  Final diagnoses:  Fx ankle, right, open type III, initial encounter    ED Discharge Orders    None        Arlean Hopping 08/20/17 1259    Orlie Dakin, MD 08/20/17 [phone]

## 2017-08-20 NOTE — Anesthesia Preprocedure Evaluation (Signed)
Anesthesia Evaluation  Patient identified by MRN, date of birth, ID band Patient awake    Reviewed: Allergy & Precautions, NPO status , Patient's Chart, lab work & pertinent test results  Airway Mallampati: I  TM Distance: >3 FB Neck ROM: Full    Dental   Pulmonary    Pulmonary exam normal        Cardiovascular hypertension, + Past MI and +CHF  Normal cardiovascular exam     Neuro/Psych    GI/Hepatic GERD  Medicated and Controlled,  Endo/Other    Renal/GU      Musculoskeletal   Abdominal   Peds  Hematology   Anesthesia Other Findings   Reproductive/Obstetrics                             Anesthesia Physical Anesthesia Plan  ASA: III  Anesthesia Plan: Regional   Post-op Pain Management:    Induction:   PONV Risk Score and Plan: 2 and Ondansetron and Treatment may vary due to age or medical condition  Airway Management Planned: Simple Face Mask  Additional Equipment:   Intra-op Plan:   Post-operative Plan:   Informed Consent: I have reviewed the patients History and Physical, chart, labs and discussed the procedure including the risks, benefits and alternatives for the proposed anesthesia with the patient or authorized representative who has indicated his/her understanding and acceptance.     Plan Discussed with: CRNA and Surgeon  Anesthesia Plan Comments:         Anesthesia Quick Evaluation

## 2017-08-20 NOTE — ED Provider Notes (Signed)
Level 5 caveat demented.  History is obtained from patient's niece patient fell while walking this morning she was attempting to walk.  Her knees realize that she was unsteady on her feet and attempted to ease her into the wheelchair when her right ankle got twisted.  There is no other injury.  [address] [address] out of her wheelchair this morning injuring her right ankle.Marland Kitchen  She was treated by EMS with fentanyl 100 mcg IV in the field.  On exam alert Glasgow Coma distress.  HEENT exam normocephalic atraumatic neck supple lungs clear to auscultation heart abdomen nondistended nontender.  Lower extremity with open wound and obvious deformity and dislocation at overlying medial malleolus she capillary refill.  DP pulse 1+.  All other extremities without contusion abrasion or tenderness neurovascular intact X-ray viewed by me  Procedural sedation and reduction of fracture dislocation performed by me ankle.  DP pulse 2+ post reduction.  Ankle was splinted by orthopedic technician   Orlie Dakin, MD 08/20/17 [phone]

## 2017-08-20 NOTE — Progress Notes (Signed)
Orthopedic Tech Progress Note Patient Details:  Shirley Strickland [birth year] [id number]  Ortho Devices Type of Ortho Device: Ace wrap, Stirrup splint, Post (short leg) splint Ortho Device/Splint Location: rle Ortho Device/Splint Interventions: Application   Post Interventions Patient Tolerated: Well Instructions Provided: Care of device   Hildred Priest 08/20/2017, 12:13 PM As ordered by PA Eugenie Norrie

## 2017-08-20 NOTE — Progress Notes (Signed)
[facility] Liaison:  RN  Spoke with Dr. Tomasa Hosteller, [facility] physician.  Surgery will be outside the plan of care for patient.  [facility] CSW on the way to talk with family about revocation. I spoke briefly with Jackelyn Poling, [facility], and she is aware that Beth, LCSW, is on her way.   If you have any questions, please feel free to contact me.   Thank you.  Edyth Gunnels, RN, BSN [facility] Liaison [phone]  All hospital liaisons are now on [place].

## 2017-08-20 NOTE — H&P (Signed)
Date: 08/20/2017               Patient Name:  Shirley Strickland MRN: [id number]  DOB: [birth year] Age / Sex: 83 y.o., female   PCP: Alvester Chou, NP         Medical Service: Internal Medicine Teaching Service         Attending Physician: Dr. Lucious Groves, DO    First Contact: Dr. Trilby Drummer Pager: [phone]  Second Contact: Dr. Hetty Ely Pager: [phone]       After Hours (After 5p/  First Contact Pager: [phone]  weekends / holidays): Second Contact Pager: [phone]   Chief Complaint: Fall  History of Present Illness: Shirley Strickland is a 83 yo F with a History of Atrial fibrillation, NSTEMI (suspected type II), CHF, HTN, DVT (IVC filter in place), and hypothyroidism presents with right ankle fracture following a fall. Patient had been getting ready for company who was to be visiting later in the day. She was walking from her tub to her wheel chair when her legs gave out. Her niece caught her as she fell. Unfortunately, she still sustained trauma to her right ankle. Her niece pressed her aunts life alert button and she was subsequently transported to the ED. Patient denies other injuries as she was helped to the floor. She endorses chronic shortness of breath. She denies lightheadedness, chest pain, nausea, fevers, or chills. Of note, patient is currently a hospice patient.   In the ED, Vital signs significant for BP 160s-190s/50s-80s. CBC Showed Hgb stable at 10.5; BMP without significant derangements. Right ankle Xray revealed dilsocation with bimalleolar fractures. Dislocation was reduced and this was confirmed by repeat xray. Orthopedic surgery was consulted who plan to clean her wound while under a nerve block with possible fixation options being considered taking into account the risks for her with any procedure requiring general anesthesia. She received clindamycin, Zofran, dilaudid, and fentanyl. Patient to be admitted for further workup and care.   Meds:  Current Meds  Medication Sig  . ALPRAZolam  (XANAX) 0.5 MG tablet Take 0.5 mg by mouth every 8 (eight) hours as needed for anxiety.  Marland Kitchen aspirin EC 81 MG tablet Take 81 mg by mouth daily.  . Cholecalciferol (VITAMIN D3) 2000 units capsule Take 2,000 Units by mouth daily.  . folic acid (FOLVITE) 1 MG tablet Take 1 mg by mouth daily.  Marland Kitchen levothyroxine (SYNTHROID, LEVOTHROID) 88 MCG tablet Take 44 mcg by mouth every morning.  . Multiple Vitamin (MULTIVITAMIN) tablet Take 1 tablet by mouth daily.  Vladimir Faster Glycol-Propyl Glycol (SYSTANE ULTRA OP) Place 1 drop into both eyes 2 (two) times daily.  Marland Kitchen senna-docusate (SENOKOT-S) 8.6-50 MG tablet Take 1 tablet by mouth 2 (two) times daily.  Marland Kitchen thiamine (VITAMIN B-1) 100 MG tablet Take 100 mg by mouth daily.  . vitamin B-12 (CYANOCOBALAMIN) 1000 MCG tablet Take 1,000 mcg by mouth daily.     Allergies: Allergies as of 08/20/2017 - Review Complete 08/20/2017  Allergen Reaction Noted  . Rocephin [ceftriaxone sodium in dextrose]  11/12/2013  . Clarithromycin Nausea Only 07/04/2011  . Sawgrass [fish allergy] Other (See Comments) 07/04/2011  . Codeine Nausea And Vomiting 07/04/2011  . Darvocet [propoxyphene n-acetaminophen] Nausea And Vomiting 07/04/2011  . Demadex [torsemide] Hives and Nausea And Vomiting 07/04/2011  . Diovan hct [valsartan-hydrochlorothiazide] Other (See Comments) 07/04/2011  . Ex-lax [senna] Other (See Comments) 07/04/2011  . Ibuprofen Nausea Only 07/04/2011  . Indanediones Other (See Comments) 07/04/2011  . Indapamide  Other (See Comments) 07/04/2011  . Morphine and related Nausea And Vomiting 07/04/2011  . Sulfa antibiotics Other (See Comments) 08/23/2013  . Benzonatate Itching and Rash 07/04/2011  . Penicillins Rash 07/04/2011  . Septra [bactrim] Rash 07/04/2011   Past Medical History:  Diagnosis Date  . A-fib ([place])   . 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 Bruise    bruise on left foot from fall 07/03/11  . Cancer [facility])    pt states only skin cancer  . CHF  (congestive heart failure) ([place])   . DVT (deep venous thrombosis) ([place]) 12/28/2013   LEFT   . Fall   . GERD (gastroesophageal reflux disease)   . HTN (hypertension)   . Knee pain, acute 07/04/11   Pt states left knee twisted under her in fall 07/03/11 and now painful  . Numbness of legs    started after fall yesterday 07/03/11  . Open ankle fracture    Right  . Osteopenia   . Pneumonia   . Rash and nonspecific skin eruption 07/04/11   rash on bilateral upper legs  . Syncope     Family History:  Family History  Problem Relation Age of Onset  . Cancer Brother   - Confirmed on admission.  Social History:  Social History   Tobacco Use  . Smoking status: Never Smoker  . Smokeless tobacco: Never Used  Substance Use Topics  . Alcohol use: Yes    Alcohol/week: 1.2 oz    Types: 2 Cans of beer per week    Comment: 2 cans beer per day   12/28/2013  DRINKS ONLY  1 CAN A DAY  . Drug use: No  - Confirmed on admission  Review of Systems: A complete ROS was negative except as per HPI.  Physical Exam: Blood pressure (!) 148/48, pulse 95, temperature 98.4 F (36.9 C), resp. rate 15, height 5\' 3"  (1.6 m), weight 141 lb (64 kg), SpO2 97 %. Physical Exam  Constitutional: She appears well-developed and well-nourished.  HENT:  Head: Normocephalic and atraumatic.  Eyes: EOM are normal. Right eye exhibits no discharge. Left eye exhibits no discharge.  Cardiovascular: Normal rate, regular rhythm, normal heart sounds and intact distal pulses.  Pulmonary/Chest: Effort normal and breath sounds normal. No respiratory distress.  Abdominal: Soft. Bowel sounds are normal. She exhibits no distension. There is no tenderness.  Musculoskeletal: She exhibits no edema.  RLE in clean and dry cast and dressing LLE with skin change of leg from prior surgery  Neurological: She is alert.  Skin: Skin is warm and dry.  Psychiatric: She has a normal mood and affect.    EKG: personally reviewed and I agree  with: Sinus rhythm Atrial premature complexes Posterior infarct, old  Ankle 2-view, Right: IMPRESSION: Probable bimalleolar fracture dislocation of the right ankle with disruption of the ankle joint mortise. Fine detail of the medial malleolar fracture is not available on this study. The distal fibular metadiaphyseal fracture exhibits angulation, comminution, and displacement.  Ankle Right Port: IMPRESSION: Reduction of the dislocation at the right ankle marked improvement in the alignment and position of fracture fragments.   Assessment & Plan by Problem:  Ms. Revak is a 83 yo F with a History of Atrial fibrillation, NSTEMI (suspected type II), CHF, HTN, DVT (IVC filter in place), and hypothyroidism presents with right ankle fracture following a fall.   Fall, Bimalleolar Ankle fracture: Open fracture following witnessed fall in bathroom at home. Dislocation reduced in ED. Ortho consulted  in ED.  - Appreciate Orthopedics recommendations - Washout and potential fixation today - Clindamycin 600mg  q8h IV, pre-op - Fentanyl Patch 12.57mcg q 72h - Fentanyl 12.14mcg IV q2h PRN Severe pain - AM CBC and BMP  Atrial Fibrillation - Continue home Metoprolol 12.5mg  BID  Hypothyroidism - Continue home Synthroid 14mcg Daily  FEN: LR 10 mL/hr, NPO DVT Prophylaxis: SCDs Code Status: DNR  Dispo: Admit patient to Inpatient with expected length of stay greater than 2 midnights.  Signed: Neva Seat, MD 08/20/2017, 7:00 PM  Pager: [phone]

## 2017-08-20 NOTE — ED Notes (Signed)
Ortho PA at bedside.  

## 2017-08-20 NOTE — ED Triage Notes (Signed)
Pt brought in by EMS due to having an open ankle fx. Pt was trying to transfer from wheelchair to recliner and foot got stuck and pt fell. Pt has obvious open fx and deformity to right ankle. Pt a&ox4. Pt received 174mcg fentanyl enroute.

## 2017-08-20 NOTE — Op Note (Signed)
Date of Surgery: 08/20/2017  INDICATIONS: Shirley Strickland is a 83 y.o.-year-old female who sustained an open right ankle fracture dislocation; she was indicated for open reduction and internal fixation due to the displaced nature of the articular fracture and came to the operating room today for this procedure. The patient did consent to the procedure after discussion of the risks and benefits.  Due to the fact that the patient is on hospice care and has a life expectancy of less than 6 months and the fact that the patient is a minimal household ambulator with a walker the decision was made amongst the surgical team and the patient and her family members to provide stability to the ankle fracture without subjecting her to aggressive orthopedic care that may result in postoperative complications.  Therefore the decision was made to perform internal fixation of the ankle fracture dislocation with retrograde Steinmann pins.  PREOPERATIVE DIAGNOSIS: Type IIIa open right trimalleolar ankle fracture dislocation  POSTOPERATIVE DIAGNOSIS: Same.  PROCEDURE:  1.  Debridement of bone, skin, soft tissue associated with open ankle fracture dislocation 2.  Open reduction internal fixation of right trimalleolar ankle fracture without fixation of posterior malleolus 3.  Adjacent tissue rearrangement right ankle 12 cm  SURGEON: N. Eduard Roux, M.D.  ASSIST: Ciro Backer [place], [place]; necessary for the timely completion of procedure and due to complexity of procedure.  ANESTHESIA:  Regional and MAC  TOURNIQUET TIME: n/a  IV FLUIDS AND URINE: See anesthesia.  ESTIMATED BLOOD LOSS: 75 mL.  IMPLANTS: 2 fully threaded steinman pins  COMPLICATIONS: None.  DESCRIPTION OF PROCEDURE: The patient was brought to the operating room and placed supine on the operating table.  The patient had been signed prior to the procedure and this was documented. The patient had the anesthesia placed by the anesthesiologist.  The  prep verification and incision time-outs were performed to confirm that this was the correct patient, site, side and location. The patient had an SCD on the opposite lower extremity. The patient did receive antibiotics prior to the incision and was re-dosed during the procedure as needed at indicated intervals.  The patient had the lower extremity prepped and draped in the standard surgical fashion.  We first began with the irrigation and debridement portion for the traumatic medial ankle wound.  Sharp excisional debridement of the bone, skin, subcutaneous tissue was performed using a knife and rongeur.  There was no gross contamination.  After thorough debridement this was then thoroughly irrigated with 6 L normal saline.  The ankle fracture dislocation was then reduced into relative alignment and length.  Using fluoroscopic guidance I then placed 2 fully threaded Steinmann  pins in a retrograde fashion through the calcaneus across the subtalar joint and into the distal tibia and into the tibial shaft.  2 pins were placed in a parallel fashion giving the ankle joint stability.  Once this was done this was confirmed under fluoroscopy.  I then performed adjacent tissue rearrangement in order to close the traumatic medial wound which measured approximately 12 cm.  She had very frail skin and had a large skin tear.  Sterile dressings were then applied.  The pins were then cut short and a bone tamp was used to bury the ends of the Steinmann pin.  A short leg splint was then placed.  Patient tolerated the procedure well had no immediate complications.  POSTOPERATIVE PLAN: Ms. Ornstein will remain nonweightbearing on this leg for approximately 6-8 weeks depending on her evidence of healing.  She will likely be nonambulatory as a result of this injury.  Azucena Cecil, MD [place] 7:39 PM

## 2017-08-20 NOTE — Transfer of Care (Signed)
Immediate Anesthesia Transfer of Care Note  Patient: Shirley Strickland  Procedure(s) Performed: IRRIGATION AND DEBRIDEMENT EXTREMITY WITH PIN FIXATION RIGHT ANKLE (Right Ankle)  Patient Location: PACU  Anesthesia Type:MAC and Regional  Level of Consciousness: awake, alert  and oriented  Airway & Oxygen Therapy: Patient Spontanous Breathing  Post-op Assessment: Report given to RN and Post -op Vital signs reviewed and stable  Post vital signs: Reviewed and stable  Last Vitals:  Vitals:   08/20/17 1705 08/20/17 1834  BP: (!) 197/49   Pulse: 89   Resp: 12   Temp:  (P) 36.9 C  SpO2: 100%     Last Pain:  Vitals:   08/20/17 1834  PainSc: (P) 0-No pain      Patients Stated Pain Goal: 3 (24/26/83 4196)  Complications: No apparent anesthesia complications

## 2017-08-20 NOTE — Anesthesia Postprocedure Evaluation (Signed)
Anesthesia Post Note  Patient: Shirley Strickland  Procedure(s) Performed: IRRIGATION AND DEBRIDEMENT EXTREMITY WITH PIN FIXATION RIGHT ANKLE (Right Ankle)     Patient location during evaluation: PACU Anesthesia Type: Regional Level of consciousness: awake, awake and alert and oriented Pain management: pain level controlled Vital Signs Assessment: post-procedure vital signs reviewed and stable Respiratory status: spontaneous breathing, nonlabored ventilation and respiratory function stable Cardiovascular status: blood pressure returned to baseline Anesthetic complications: no    Last Vitals:  Vitals:   08/20/17 1931 08/20/17 1947  BP:  (!) 151/44  Pulse:  87  Resp:    Temp: 36.6 C 36.6 C  SpO2:  93%    Last Pain:  Vitals:   08/20/17 1947  TempSrc: Oral  PainSc:                  Kristin Lamagna COKER

## 2017-08-20 NOTE — Consult Note (Addendum)
Reason for Consult:Open right ankle fx Referring Physician: Ikeisha Strickland is an 83 y.o. female.  HPI: Shirley Strickland was transferring to her WC when she slipped and fell, injuring her right ankle. She had an obvious wound and deformity and was brought to the ED for evaluation. Orthopedic surgery was consulted. She c/o localized pain at the site of injury only.  Past Medical History:  Diagnosis Date  . A-fib ([place])   . 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 Bruise    bruise on left foot from fall 07/03/11  . Cancer [facility])    pt states only skin cancer  . CHF (congestive heart failure) ([place])   . DVT (deep venous thrombosis) ([place]) 12/28/2013   LEFT   . Fall   . GERD (gastroesophageal reflux disease)   . HTN (hypertension)   . Knee pain, acute 07/04/11   Pt states left knee twisted under her in fall 07/03/11 and now painful  . Numbness of legs    started after fall yesterday 07/03/11  . Osteopenia   . Pneumonia   . Rash and nonspecific skin eruption 07/04/11   rash on bilateral upper legs  . Syncope     Past Surgical History:  Procedure Laterality Date  . APPENDECTOMY    . CATARACT EXTRACTION    . ELBOW SURGERY     right  . EXPLORATORY LAPAROTOMY  07/04/11   years ago to find cause of bleeding    No family history on file.  Social History:  reports that  has never smoked. she has never used smokeless tobacco. She reports that she drinks about 1.2 oz of alcohol per week. She reports that she does not use drugs.  Allergies:  Allergies  Allergen Reactions  . Rocephin [Ceftriaxone Sodium In Dextrose]     Unable to breath  . Clarithromycin Nausea Only  . Cod [employer] Allergy] Other (See Comments)    Stomach pains and passed out recently - doesn't remember reaction before this weekend  . Codeine Nausea And Vomiting  . Darvocet [Propoxyphene N-Acetaminophen] Nausea And Vomiting  . Demadex [Torsemide] Hives and Nausea And Vomiting    UNKNOWN  . Diovan Hct  [Valsartan-Hydrochlorothiazide] Other (See Comments)    Nausea, sick, hives  06-07-17 PATIENT IS CURRENTLY ON THIS MEDICATION, NO SIDE EFFECTS  . Ex-Lax [Senna] Other (See Comments)    unknown  . Ibuprofen Nausea Only  . Indanediones Other (See Comments)    unknown  . Indapamide Other (See Comments)    unknown  . Morphine And Related Nausea And Vomiting  . Sulfa Antibiotics Other (See Comments)    Couldn't breathe pt states that she was in a coma foe 3 weeks   . Benzonatate Itching and Rash    Eyes swelled  . Penicillins Rash  . Septra [Bactrim] Rash    Medications: I have reviewed the patient's current medications.  Results for orders placed or performed during the hospital encounter of 08/20/17 (from the past 48 hour(s))  Basic metabolic panel     Status: Abnormal   Collection Time: 08/20/17 10:46 AM  Result Value Ref Range   Sodium 136 135 - 145 mmol/L   Potassium 4.1 3.5 - 5.1 mmol/L   Chloride 99 (L) 101 - 111 mmol/L   CO2 26 22 - 32 mmol/L   Glucose, Bld 126 (H) 65 - 99 mg/dL   BUN 16 6 - 20 mg/dL   Creatinine, Ser 0.93 0.44 - 1.00 mg/dL  Calcium 9.7 8.9 - 10.3 mg/dL   GFR calc non Af Amer 49 (L) >60 mL/min   GFR calc Af Amer 57 (L) >60 mL/min    Comment: (NOTE) The eGFR has been calculated using the CKD EPI equation. This calculation has not been validated in all clinical situations. eGFR's persistently <60 mL/min signify possible Chronic Kidney Disease.    Anion gap 11 5 - 15  I-Stat Chem 8, ED     Status: Abnormal   Collection Time: 08/20/17 10:58 AM  Result Value Ref Range   Sodium 135 135 - 145 mmol/L   Potassium 4.1 3.5 - 5.1 mmol/L   Chloride 100 (L) 101 - 111 mmol/L   BUN 17 6 - 20 mg/dL   Creatinine, Ser 0.90 0.44 - 1.00 mg/dL   Glucose, Bld 126 (H) 65 - 99 mg/dL   Calcium, Ion 1.21 1.15 - 1.40 mmol/L   TCO2 26 22 - 32 mmol/L   Hemoglobin 10.9 (L) 12.0 - 15.0 g/dL   HCT 32.0 (L) 36.0 - 46.0 %    Dg Ankle 2 Views Right  Result Date:  08/20/2017 CLINICAL DATA:  O put right ankle fracture clinically sustained while trying to transfer from wheelchair to a recliner. EXAM: RIGHT ANKLE - 2 VIEW COMPARISON:  Right ankle series of September 28, 2013 FINDINGS: The patient has sustained a bimalleolar fracture dislocation of the right ankle. The distal fibular metadiaphyseal fracture is comminuted and markedly angulated and displaced. The medial malleolar fracture is not well defined. No definite posterior malleolar fracture is observed. There is diffuse soft tissue swelling. As best as can be determined the talus and calcaneus are intact. IMPRESSION: Probable bimalleolar fracture dislocation of the right ankle with disruption of the ankle joint mortise. Fine detail of the medial malleolar fracture is not available on this study. The distal fibular metadiaphyseal fracture exhibits angulation, comminution, and displacement. Electronically Signed   By: David  Martinique M.D.   On: 08/20/2017 11:08    Review of Systems  Constitutional: Negative for weight loss.  HENT: Negative for ear discharge, ear pain, hearing loss and tinnitus.   Eyes: Negative for blurred vision, double vision, photophobia and pain.  Respiratory: Negative for cough, sputum production and shortness of breath.   Cardiovascular: Negative for chest pain.  Gastrointestinal: Negative for abdominal pain, nausea and vomiting.  Genitourinary: Negative for dysuria, flank pain, frequency and urgency.  Musculoskeletal: Positive for joint pain (Right ankle). Negative for back pain, falls, myalgias and neck pain.  Neurological: Negative for dizziness, tingling, sensory change, focal weakness, loss of consciousness and headaches.  Endo/Heme/Allergies: Does not bruise/bleed easily.  Psychiatric/Behavioral: Negative for depression, memory loss and substance abuse. The patient is not nervous/anxious.    Height '5\' 3"'$  (1.6 m), weight 64 kg (141 lb). Physical Exam  Constitutional: She appears  well-developed and well-nourished. No distress.  HENT:  Head: Normocephalic.  Eyes: Conjunctivae are normal. Right eye exhibits no discharge. Left eye exhibits no discharge. No scleral icterus.  Neck: Normal range of motion.  Cardiovascular: Normal rate and regular rhythm.  Respiratory: Effort normal. No respiratory distress.  Musculoskeletal:  RLE Open medial ankle fx/dislocation, no ecchymosis or rash  TTP  No knee or ankle effusion  Knee stable to varus/ valgus and anterior/posterior stress  Sens DPN, SPN, TN intact  Motor EHL, ext, flex, evers 5/5  DP 2+  Neurological: She is alert.  Skin: Skin is warm and dry. She is not diaphoretic.  Psychiatric: She has a normal mood  and affect. Her behavior is normal.    Assessment/Plan: Fall Open right ankle fx/dislocation -- Reduced by EDP, awaiting post reduction x-rays. 1L NS poured through skin defect. Will need formal I&D in OR by Ninfa Linden or Carnel Stegman this afternoon +/- fixation depending on IM clearance. Multiple medical problems -- Have asked IM to admit, clear for surgery    Lisette Abu, PA-C Orthopedic Surgery [phone] 08/20/2017, 11:36 AM    I personally examined and evaluated the patient with her 2 nieces at bedside.  The patient is currently on hospice with less than 6 months of life expectancy due to cardiac issues.  I reviewed the x-rays with the patient and her family members which do show osteoporosis and decreased bone density that would make fixation with plates and screws less than ideal.  Given the circumstances I think the best thing to do is to washout the open wound and to realign her ankle so that we can approximate the traumatic wound.  We will stabilize ankle joint by placing 2 Steinmann pins across the ankle joint.  We will plan on placing her in a splint afterwards.  Patient and the family understand that she will likely be nonambulatory as a result of this injury.  They are in agreement with the plan as  detailed.  The understand the risk benefits alternatives to surgery.  Azucena Cecil, MD [place] 4:44 PM

## 2017-08-21 ENCOUNTER — Other Ambulatory Visit: Payer: Self-pay

## 2017-08-21 ENCOUNTER — Encounter (HOSPITAL_COMMUNITY): Payer: Self-pay

## 2017-08-21 DIAGNOSIS — Z881 Allergy status to other antibiotic agents status: Secondary | ICD-10-CM

## 2017-08-21 DIAGNOSIS — M80871A Other osteoporosis with current pathological fracture, right ankle and foot, initial encounter for fracture: Secondary | ICD-10-CM

## 2017-08-21 DIAGNOSIS — Z885 Allergy status to narcotic agent status: Secondary | ICD-10-CM

## 2017-08-21 DIAGNOSIS — Z7952 Long term (current) use of systemic steroids: Secondary | ICD-10-CM

## 2017-08-21 DIAGNOSIS — Z91013 Allergy to seafood: Secondary | ICD-10-CM

## 2017-08-21 DIAGNOSIS — R0602 Shortness of breath: Secondary | ICD-10-CM

## 2017-08-21 DIAGNOSIS — Z66 Do not resuscitate: Secondary | ICD-10-CM

## 2017-08-21 DIAGNOSIS — I2510 Atherosclerotic heart disease of native coronary artery without angina pectoris: Secondary | ICD-10-CM

## 2017-08-21 DIAGNOSIS — Z888 Allergy status to other drugs, medicaments and biological substances status: Secondary | ICD-10-CM

## 2017-08-21 DIAGNOSIS — I110 Hypertensive heart disease with heart failure: Secondary | ICD-10-CM

## 2017-08-21 DIAGNOSIS — Z95828 Presence of other vascular implants and grafts: Secondary | ICD-10-CM

## 2017-08-21 DIAGNOSIS — Z79899 Other long term (current) drug therapy: Secondary | ICD-10-CM

## 2017-08-21 DIAGNOSIS — I4891 Unspecified atrial fibrillation: Secondary | ICD-10-CM

## 2017-08-21 DIAGNOSIS — Z886 Allergy status to analgesic agent status: Secondary | ICD-10-CM

## 2017-08-21 DIAGNOSIS — Z86718 Personal history of other venous thrombosis and embolism: Secondary | ICD-10-CM

## 2017-08-21 DIAGNOSIS — E039 Hypothyroidism, unspecified: Secondary | ICD-10-CM

## 2017-08-21 DIAGNOSIS — Z882 Allergy status to sulfonamides status: Secondary | ICD-10-CM

## 2017-08-21 DIAGNOSIS — I252 Old myocardial infarction: Secondary | ICD-10-CM

## 2017-08-21 DIAGNOSIS — I509 Heart failure, unspecified: Secondary | ICD-10-CM

## 2017-08-21 DIAGNOSIS — Z79890 Hormone replacement therapy (postmenopausal): Secondary | ICD-10-CM

## 2017-08-21 DIAGNOSIS — Z880 Allergy status to penicillin: Secondary | ICD-10-CM

## 2017-08-21 LAB — CBC
HEMATOCRIT: 29.2 % — AB (ref 36.0–46.0)
HEMOGLOBIN: 9.4 g/dL — AB (ref 12.0–15.0)
MCH: 28 pg (ref 26.0–34.0)
MCHC: 32.2 g/dL (ref 30.0–36.0)
MCV: 86.9 fL (ref 78.0–100.0)
Platelets: 230 10*3/uL (ref 150–400)
RBC: 3.36 MIL/uL — ABNORMAL LOW (ref 3.87–5.11)
RDW: 14.6 % (ref 11.5–15.5)
WBC: 11.2 10*3/uL — ABNORMAL HIGH (ref 4.0–10.5)

## 2017-08-21 LAB — BASIC METABOLIC PANEL
ANION GAP: 7 (ref 5–15)
BUN: 16 mg/dL (ref 6–20)
CALCIUM: 9.3 mg/dL (ref 8.9–10.3)
CHLORIDE: 101 mmol/L (ref 101–111)
CO2: 26 mmol/L (ref 22–32)
Creatinine, Ser: 0.93 mg/dL (ref 0.44–1.00)
GFR calc Af Amer: 57 mL/min — ABNORMAL LOW (ref >60)
GFR calc non Af Amer: 49 mL/min — ABNORMAL LOW (ref >60)
GLUCOSE: 129 mg/dL — AB (ref 65–99)
Potassium: 4.7 mmol/L (ref 3.5–5.1)
Sodium: 134 mmol/L — ABNORMAL LOW (ref 135–145)

## 2017-08-21 MED ORDER — DIPHENHYDRAMINE HCL 50 MG/ML IJ SOLN
INTRAMUSCULAR | Status: AC
  Administered 2017-08-21: 12.5 mg
  Filled 2017-08-21: qty 1

## 2017-08-21 MED ORDER — CIPROFLOXACIN IN D5W 200 MG/100ML IV SOLN
200.0000 mg | INTRAVENOUS | Status: AC
  Administered 2017-08-21 – 2017-08-23 (×3): 200 mg via INTRAVENOUS
  Filled 2017-08-21 (×3): qty 100

## 2017-08-21 MED ORDER — VANCOMYCIN HCL 500 MG IV SOLR
500.0000 mg | INTRAVENOUS | Status: DC
  Administered 2017-08-21: 500 mg via INTRAVENOUS
  Filled 2017-08-21 (×3): qty 500

## 2017-08-21 MED ORDER — PREDNISONE 5 MG PO TABS
5.0000 mg | ORAL_TABLET | Freq: Once | ORAL | Status: AC
  Administered 2017-08-21: 5 mg via ORAL
  Filled 2017-08-21: qty 1

## 2017-08-21 MED ORDER — DIPHENHYDRAMINE HCL 50 MG/ML IJ SOLN: 12.5000 mg | INTRAMUSCULAR | Status: DC | PRN

## 2017-08-21 MED ORDER — DIPHENHYDRAMINE HCL 50 MG/ML IJ SOLN
INTRAMUSCULAR | Status: AC
  Administered 2017-08-21: 03:00:00
  Filled 2017-08-21: qty 1

## 2017-08-21 MED ORDER — FAMOTIDINE IN NACL 20-0.9 MG/50ML-% IV SOLN
20.0000 mg | Freq: Once | INTRAVENOUS | Status: AC
  Administered 2017-08-21: 20 mg via INTRAVENOUS
  Filled 2017-08-21: qty 50

## 2017-08-21 MED ORDER — METHYLPREDNISOLONE SODIUM SUCC 125 MG IJ SOLR
INTRAMUSCULAR | Status: AC
  Administered 2017-08-21: 80 mg
  Filled 2017-08-21: qty 2

## 2017-08-21 MED ORDER — DIPHENHYDRAMINE HCL 50 MG/ML IJ SOLN
12.5000 mg | Freq: Four times a day (QID) | INTRAMUSCULAR | Status: DC | PRN
  Administered 2017-08-22: 12.5 mg via INTRAVENOUS
  Filled 2017-08-21: qty 1

## 2017-08-21 NOTE — Progress Notes (Signed)
Called to patients room by nurse tech.  Patient was complaining of itching to the bilateral hands and the feeling that her throat was swelling.  Patient stated that this has happened before and prednisone takes care of it.  Patient was placed on Nasal cannula, continuous pulse ox, and vitals taken. Ordered to give Benadryl 12.5 mg x2, IV Solumedrol 80 mg, IV famotidine 20 mg. Orders for telemetry, LR at 75, hold next dose of clindamycin and thyroid medication, keep patient NPO over night then do bedside swallow in the am. Vitals Q4 hrs

## 2017-08-21 NOTE — Discharge Summary (Signed)
Name: Shirley Strickland MRN: [id number] DOB: 1916/01/28 83 y.o. PCP: Alvester Chou, NP  Date of Admission: 08/20/2017 10:32 AM Date of Discharge:  Attending Physician: Annia Belt, MD  Discharge Diagnosis: 1. Type IIIA open trimalleolar fracture of right lower leg with dislocation 2. AKI  Discharge Medications: Allergies as of 08/26/2017      Reactions   Rocephin [ceftriaxone Sodium In Dextrose]    Unable to breath   Clarithromycin Nausea Only   Cod [fish Allergy] Other (See Comments)   Stomach pains and passed out recently - doesn't remember reaction before this weekend   Codeine Nausea And Vomiting   Darvocet [propoxyphene N-acetaminophen] Nausea And Vomiting   Demadex [torsemide] Hives, Nausea And Vomiting   UNKNOWN   Diovan Hct [valsartan-hydrochlorothiazide] Other (See Comments)   Nausea, sick, hives  06-07-17 PATIENT IS CURRENTLY ON THIS MEDICATION, NO SIDE EFFECTS   Ex-lax [senna] Other (See Comments)   unknown   Ibuprofen Nausea Only   Indanediones Other (See Comments)   unknown   Indapamide Other (See Comments)   unknown   Morphine And Related Nausea And Vomiting   Sulfa Antibiotics Other (See Comments)   Couldn't breathe pt states that she was in a coma foe 3 weeks   Benzonatate Itching, Rash   Eyes swelled   Penicillins Rash   Septra [bactrim] Rash      Medication List    TAKE these medications   acetaminophen 325 MG tablet Commonly known as:  TYLENOL Take 2 tablets (650 mg total) by mouth every 6 (six) hours as needed for mild pain or moderate pain.   ALPRAZolam 0.5 MG tablet Commonly known as:  XANAX Take 0.5 mg by mouth every 8 (eight) hours as needed for anxiety.   aspirin EC 81 MG tablet Take 81 mg by mouth daily.   folic acid 1 MG tablet Commonly known as:  FOLVITE Take 1 mg by mouth daily.   levothyroxine 88 MCG tablet Commonly known as:  SYNTHROID, LEVOTHROID Take 44 mcg by mouth every morning.   metoprolol tartrate 25 MG  tablet Commonly known as:  LOPRESSOR Take 0.5 tablets (12.5 mg total) by mouth 2 (two) times daily.   multivitamin tablet Take 1 tablet by mouth daily.   senna-docusate 8.6-50 MG tablet Commonly known as:  Senokot-S Take 1 tablet by mouth 2 (two) times daily.   spiritus frumenti Soln Commonly known as:  ethyl alcohol Take 1 each by mouth daily.   SYSTANE ULTRA OP Place 1 drop into both eyes 2 (two) times daily.   thiamine 100 MG tablet Commonly known as:  VITAMIN B-1 Take 100 mg by mouth daily.   vitamin B-12 1000 MCG tablet Commonly known as:  CYANOCOBALAMIN Take 1,000 mcg by mouth daily.   Vitamin D3 2000 units capsule Take 2,000 Units by mouth daily.       Disposition and follow-up:   Shirley Strickland was discharged from [facility] in Stable condition.  At the hospital follow up visit please address:  1.  Ms Shirley Strickland was evaluated for acute ankle injury after a fall. She underwent ORIF on 08/20/2017 and had plans to follow up with orthopedic surgery 1 week after discharge for a wound recheck. Please assess if she has followed up appropriately.  Patient also experienced an AKI, with Cr elevated 2.92 with baseline around 0.9-1.0. This was resolving on discharge. As requested below, please obtain BMP to assess Cr level.  2.  Labs / imaging needed  at time of follow-up: BMP to assess kidney function, as patient had AKI while inpatient  3.  Pending labs/ test needing follow-up: None  Follow-up Appointments:  Contact information for follow-up providers    Leandrew Koyanagi, MD Follow up in 1 week(s).   Specialty:  Orthopedic Surgery Why:  For wound re-check Contact information: [place] [address] [phone]        Alvester Chou, NP. Schedule an appointment as soon as possible for a visit in 2 week(s).   Specialty:  Nurse Practitioner Why:  Please follow up with your primary physician regarding your recent  hospitalization. Contact information: [facility] [place] [address] [phone]            Contact information for after-discharge care    Destination    [facility] Follow up.   Service:  Skilled Nursing Contact information: [place] [facility] Course by problem list:   1. Type IIIA open trimalleolar fracture of right lower leg with dislocation Patient admitted with open fracture to right ankle after a fall. Dislocation reduced in the ED. Anesthesia performed nerve block and Orthopedic Surgery performed debridement and ORIF on 12/27. She was started on IV Clindamycin for prophylaxis with open fracture. She received 2 doses and then developed pruritus on her palms and forearms as well as a sensation of throat swelling. She was treated with IV solumedrol and benadryl with improvement in her symptoms back to baseline. Patient has documented allergies to penicillin, sulfa antibiotics, and ceftriaxone, so Clindamycin was discontinued and changed to Vancomycin + Ciprofloxacin (for GN/Pseudomonal coverage) after discussion with ID Pharmacist. Antibiotics continued for 72h total (last day 08/23/2017). Discharged to SNF with plan to re-enroll in hospice after rehab.  2. AKI Cr increased to 2.92 during hospitalization. Renal U/S, UA, and bladder scan unremarkable. Likely pre-renal secondary to dehydration and transient episode of hypotension. Managed with IV fluids with improvement in Cr.  3. Hx of Atrial fibrillation Rate controlled on metoprolol. CHADS2-VASc score of at least 6, with 13.6% annual risk of CVA. Patient not currently on anticoagulation secondary to prior GI bleeds with anticoagulation that required hospitalization and pRBC transfusions. Home metoprolol continued and patient received DVT prophylaxis in setting of orthopedic surgery with  Lovenox.  Discharge Vitals:   BP (!) 178/61 (BP Location: Left Arm)   Pulse 69   Temp 98.8 F (37.1 C) (Oral)   Resp 15   Ht 5\' 3"  (1.6 m)   Wt 154 lb 8.7 oz (70.1 kg)   SpO2 96%   BMI 27.38 kg/m   Pertinent Labs, Studies, and Procedures:  CBC Latest Ref Rng & Units 08/26/2017 08/24/2017 08/23/2017  WBC 4.0 - 10.5 K/uL 8.4 8.5 14.3(H)  Hemoglobin 12.0 - 15.0 g/dL 7.3(L) 7.6(L) 7.7(L)  Hematocrit 36.0 - 46.0 % 22.4(L) 23.3(L) 23.7(L)  Platelets 150 - 400 K/uL 224 174 177   CMP Latest Ref Rng & Units 08/26/2017 08/25/2017 08/24/2017  Glucose 65 - 99 mg/dL 108(H) 118(H) 115(H)  BUN 6 - 20 mg/dL 44(H) 44(H) 44(H)  Creatinine 0.44 - 1.00 mg/dL 2.58(H) 2.80(H) 2.92(H)  Sodium 135 - 145 mmol/L 133(L) 134(L) 130(L)  Potassium 3.5 - 5.1 mmol/L 4.8 5.0 4.8  Chloride 101 - 111 mmol/L 102 102 97(L)  CO2 22 - 32 mmol/L 25 24 23  Calcium 8.9 - 10.3 mg/dL 8.7(L) 8.6(L) 8.2(L)  Total Protein 6.5 - 8.1 g/dL - - -  Total Bilirubin 0.3 - 1.2 mg/dL - - -  Alkaline Phos 38 - 126 U/L - - -  AST 15 - 41 U/L - - -  ALT 14 - 54 U/L - - -   UA with ur prot 100  Right ankle x-ray 08/20/2017 Probable bimalleolar fracture dislocation of the right ankle with disruption of the ankle joint mortise. Fine detail of the medial malleolar fracture is not available on this study. The distal fibular metadiaphyseal fracture exhibits angulation, comminution, and displacement.  Right ankle x-ray 08/20/2017 Reduction of the dislocation at the right ankle marked improvement in the alignment and position of fracture fragments.  CXR 08/22/2017 1. No evidence of lung hypoexpansion to suggest airway obstruction secondary to a foreign body. A pill is unlikely to be visible on radiograph. 2. Minimal left basilar atelectasis. Suspect small left pleural effusion.  Renal U/S 08/23/2017 1. No mass or hydronephrosis. 2. Bilateral renal cortical volume loss.  Discharge Instructions: Discharge Instructions    Call MD  for:  persistant nausea and vomiting   Complete by:  As directed    Call MD for:  redness, tenderness, or signs of infection (pain, swelling, redness, odor or green/yellow discharge around incision site)   Complete by:  As directed    Call MD for:  severe uncontrolled pain   Complete by:  As directed    Call MD for:  temperature >100.4   Complete by:  As directed    Diet - low sodium heart healthy   Complete by:  As directed    Discharge instructions   Complete by:  As directed    You were evaluated for an ankle injury after a fall. Your operating surgeon would like to see you within 1 week of discharge from the hospital to check on your wounds. Please arrange follow up when you leave the hospital.   Increase activity slowly   Complete by:  As directed       Signed: Thomasene Ripple, MD 08/26/2017, 12:02 PM   Pager: Mamie Nick [phone]

## 2017-08-21 NOTE — Plan of Care (Signed)
  Progressing Education: Knowledge of General Education information will improve 08/21/2017 1325 - Progressing by Rance Muir, RN Health Behavior/Discharge Planning: Ability to manage health-related needs will improve 08/21/2017 1325 - Progressing by Rance Muir, RN Clinical Measurements: Ability to maintain clinical measurements within normal limits will improve 08/21/2017 1325 - Progressing by Rance Muir, RN Will remain free from infection 08/21/2017 1325 - Progressing by Rance Muir, RN Diagnostic test results will improve 08/21/2017 1325 - Progressing by Rance Muir, RN Respiratory complications will improve 08/21/2017 1325 - Progressing by Rance Muir, RN Cardiovascular complication will be avoided 08/21/2017 1325 - Progressing by Rance Muir, RN Activity: Risk for activity intolerance will decrease 08/21/2017 1325 - Progressing by Rance Muir, RN Nutrition: Adequate nutrition will be maintained 08/21/2017 1325 - Progressing by Rance Muir, RN Coping: Level of anxiety will decrease 08/21/2017 1325 - Progressing by Rance Muir, RN Elimination: Will not experience complications related to bowel motility 08/21/2017 1325 - Progressing by Rance Muir, RN Will not experience complications related to urinary retention 08/21/2017 1325 - Progressing by Rance Muir, RN Pain Managment: General experience of comfort will improve 08/21/2017 1325 - Progressing by Rance Muir, RN Safety: Ability to remain free from injury will improve 08/21/2017 1325 - Progressing by Rance Muir, RN Skin Integrity: Risk for impaired skin integrity will decrease 08/21/2017 1325 - Progressing by Rance Muir, RN

## 2017-08-21 NOTE — Evaluation (Signed)
Physical Therapy Evaluation Patient Details Name: Shirley Strickland MRN: [id number] DOB: 04-08-1916 Today's Date: 08/21/2017   History of Present Illness  Pt is a 83 y/o female who presents s/p fall at home while attempting to transfer to her wheelchair. She was brought to the ED and found to have an open trimalleolar ankle fracture dislocation s/p ORIF on 08/20/17. PMH significant for DVT, CHF, CA, significantly HOH (talk into R ear).  Clinical Impression  Pt admitted with above diagnosis. Pt currently with functional limitations due to the deficits listed below (see PT Problem List). At the time of PT eval, pt was able to demonstrate transfers with +2 max to total assist for balance support and safety. Pt has had 24/7 assist from 3 nieces since October 15, however nieces state they will not be able to continue caring for her at d/c due to the increased physical assist required for basic transfers, as well as the inaccessibility of the home via wheelchair. Pt is NWB RLE and will require use of MaxiSky lift to/from bed with nursing staff on days therapy is not seeing her. Pt will benefit from skilled PT to increase their independence and safety with mobility to allow discharge to the venue listed below.       Follow Up Recommendations SNF;Supervision/Assistance - 24 hour    Equipment Recommendations  None recommended by PT (TBD by next venue of care)   Recommendations for Other Services       Precautions / Restrictions Precautions Precautions: Fall Precaution Comments: Lower legs very sensitive to touch - used sheets wrapped under ankles to pick up and move LE's during session.  Restrictions Weight Bearing Restrictions: Yes RLE Weight Bearing: Non weight bearing      Mobility  Bed Mobility Overal bed mobility: Needs Assistance Bed Mobility: Supine to Sit     Supine to sit: Total assist;+2 for physical assistance;HOB elevated     General bed mobility comments: Sheets looped under  ankles to assist in lifting, and bed pad used to helicopter transfer pt around to EOB. HOB elevated for assist, and max assist provided at the trunk for elevation to full sitting position.   Transfers Overall transfer level: Needs assistance Equipment used: 2 person hand held assist Transfers: Sit to/from [facility] Sit to Stand: Max assist;+2 physical assistance;From elevated surface Stand pivot transfers: Max assist;+2 physical assistance;From elevated surface       General transfer comment: VC's for sequencing and general safety. Pt was able to power-up to full standing position with +2 assist, and bed pad cradled under hips for added support as pt pivoted around to the chair.   Ambulation/Gait             General Gait Details: Not tested - pt only transfers at home, no ambulation.   Stairs            Wheelchair Mobility    Modified Rankin (Stroke Patients Only)       Balance Overall balance assessment: Needs assistance Sitting-balance support: Feet supported;No upper extremity supported Sitting balance-Leahy Scale: Fair     Standing balance support: No upper extremity supported;During functional activity Standing balance-Leahy Scale: Zero Standing balance comment: +2 assist required.                              Pertinent Vitals/Pain Pain Assessment: Faces Faces Pain Scale: Hurts whole lot Pain Location: Bilateral lower legs Pain Descriptors / Indicators: Operative  site guarding;Grimacing;Moaning Pain Intervention(s): Limited activity within patient's tolerance;Monitored during session;Repositioned    Home Living Family/patient expects to be discharged to:: Skilled nursing facility                 Additional Comments: Pt from home with 3 nieces rotating care. 1 niece, Jackelyn Poling, is caregiver 5 days/week 24 hours, and 2 other nieces alternate care on the weekends.     Prior Function Level of Independence: Needs assistance    Gait / Transfers Assistance Needed: Transfers only            Hand Dominance   Dominant Hand: Right    Extremity/Trunk Assessment   Upper Extremity Assessment Upper Extremity Assessment: Defer to OT evaluation    Lower Extremity Assessment Lower Extremity Assessment: RLE deficits/detail;LLE deficits/detail RLE Deficits / Details: Acute pain, decreased strength and AROM consistent with above mentioned procedure. RLE: Unable to fully assess due to pain;Unable to fully assess due to immobilization LLE Deficits / Details: Family reports LLE as the "bad leg", and expressed concern with the pt being able to rely on the L foot for support. Pt unable to tolerate light touch to the lower leg and it was difficult to fully assess LLE: Unable to fully assess due to pain    Cervical / Trunk Assessment Cervical / Trunk Assessment: Kyphotic;Other exceptions Cervical / Trunk Exceptions: Forward head/rounded shoulder posture  Communication   Communication: HOH(Talk into R ear)  Cognition Arousal/Alertness: Awake/alert Behavior During Therapy: WFL for tasks assessed/performed Overall Cognitive Status: Within Functional Limits for tasks assessed                                        General Comments      Exercises     Assessment/Plan    PT Assessment Patient needs continued PT services  PT Problem List Decreased strength;Decreased range of motion;Decreased activity tolerance;Decreased balance;Decreased mobility;Decreased knowledge of use of DME;Decreased safety awareness;Decreased knowledge of precautions;Pain       PT Treatment Interventions DME instruction;Gait training;Stair training;Functional mobility training;Therapeutic activities;Therapeutic exercise;Neuromuscular re-education;Patient/family education    PT Goals (Current goals can be found in the Care Plan section)  Acute Rehab PT Goals Patient Stated Goal: Get better, back to being able to transfer to  her wheelchair PT Goal Formulation: With patient/family Time For Goal Achievement: 09/04/17 Potential to Achieve Goals: Good    Frequency Min 3X/week   Barriers to discharge        Co-evaluation PT/OT/SLP Co-Evaluation/Treatment: Yes Reason for Co-Treatment: To address functional/ADL transfers;For patient/therapist safety PT goals addressed during session: Mobility/safety with mobility;Balance         AM-PAC PT "6 Clicks" Daily Activity  Outcome Measure Difficulty turning over in bed (including adjusting bedclothes, sheets and blankets)?: Unable Difficulty moving from lying on back to sitting on the side of the bed? : Unable Difficulty sitting down on and standing up from a chair with arms (e.g., wheelchair, bedside commode, etc,.)?: Unable Help needed moving to and from a bed to chair (including a wheelchair)?: Total Help needed walking in hospital room?: Total Help needed climbing 3-5 steps with a railing? : Total 6 Click Score: 6    End of Session Equipment Utilized During Treatment: Gait belt;Oxygen Activity Tolerance: Patient tolerated treatment well Patient left: in chair;with call bell/phone within reach;with nursing/sitter in room;with family/visitor present Nurse Communication: Mobility status;Need for lift equipment PT Visit Diagnosis:  History of falling (Z91.81);Pain;Other abnormalities of gait and mobility (R26.89) Pain - Right/Left: Right Pain - part of body: Ankle and joints of foot    Time: 9323-5573 PT Time Calculation (min) (ACUTE ONLY): 37 min   Charges:   PT Evaluation $PT Eval Moderate Complexity: 1 Mod     PT G Codes:        [name], PT, DPT Acute Rehabilitation Services Pager: [phone]   [name] 08/21/2017, 11:20 AM

## 2017-08-21 NOTE — Progress Notes (Signed)
[facility] Liaison:  RN  Patient/family revoking hospice services in order to pursue surgery that is outside plan of care.  Patient/family are aware that they are more than welcome to come back to services.  Thank you,  Edyth Gunnels, RN, [facility] Liaison [phone]

## 2017-08-21 NOTE — Significant Event (Addendum)
Rapid Response Event Note  Overview: Time Called: 0233 Arrival Time: 0235 Event Type: Other (Comment)  Initial Focused Assessment:  RRT initiated for patient. Bedside RN did also call me at the same time, per RN, patient was having an acute allergic reaction.  When I arrived, patient was alert, able to talk, + itching bilateral hands and anterior chest, per patient, having trouble swallowing felt like throat was swollen.   No hives or redness noticed.  Lung sounds diminished throughout but + air movement bilaterally, no stridor or wheezing, not in respiratory distress when I arrived, protecting airway, maintaining secretions.  Patient was on NRB 15L which was started prior to my arrival (sats were low per RN).  I was able to titrate the oxygen down to 2L [place], sat probe was replaced and sats were now reading 100%.  I asked nursing staff pull IV Benadryl and IV Solumedrol and have it ready.  [facility] MDs came to bedside.  VS remained stable, SBP 160s, HR low 100s, RR 20-22, sats > 98% on 2LNC  Interventions: -- Patient received 25mg  IV Benadryl, 80mg  IV Solu Medrol, 20mg  IV Pepcid -- Patient stated her itching was nearly gone, still felt like throat was little swollen.   -- Pulled 500 on IS -- After a few minutes patient was able to go to sleep and rest.  -- Keep NPO, bedside swallow in AM   Plan of Care (if not transferred): -- I called at [phone] for an update, per RN, patient is resting comfortably, placed on tele and is on continuous pulse ox  Event Summary: Name of Physician Notified: [facility] MDs at 0240    at    Outcome: Stayed in room and stabalized  Event End Time: [place], [place]

## 2017-08-21 NOTE — Progress Notes (Signed)
Pharmacy Antibiotic Note  Shirley Strickland is a 83 y.o. female admitted on 08/20/2017 with open ankle fracture.  Pharmacy has been consulted for vancomycin and ciprofloxacin dosing.  Several abx allergies. SCr stable, CrCl ~66ml/min.  Plan: Start ciprofloxacin 200mg  IV Q24h Start vancomycin 500mg  IV Q24h Monitor clinical picture, renal function, VT prn F/U C&S, abx deescalation / LOT  Height: 5\' 3"  (160 cm) Weight: 141 lb (64 kg) IBW/kg (Calculated) : 52.4  Temp (24hrs), Avg:98.6 F (37 C), Min:97.9 F (36.6 C), Max:99.3 F (37.4 C)  Recent Labs  Lab 08/20/17 1046 08/20/17 1058 08/21/17 0557  WBC 10.5  --  11.2*  CREATININE 0.93 0.90 0.93    Estimated Creatinine Clearance: 29.7 mL/min (by C-G formula based on SCr of 0.93 mg/dL).    Allergies  Allergen Reactions  . Rocephin [Ceftriaxone Sodium In Dextrose]     Unable to breath  . Clarithromycin Nausea Only  . Cod [employer] Allergy] Other (See Comments)    Stomach pains and passed out recently - doesn't remember reaction before this weekend  . Codeine Nausea And Vomiting  . Darvocet [Propoxyphene N-Acetaminophen] Nausea And Vomiting  . Demadex [Torsemide] Hives and Nausea And Vomiting    UNKNOWN  . Diovan Hct [Valsartan-Hydrochlorothiazide] Other (See Comments)    Nausea, sick, hives  06-07-17 PATIENT IS CURRENTLY ON THIS MEDICATION, NO SIDE EFFECTS  . Ex-Lax [Senna] Other (See Comments)    unknown  . Ibuprofen Nausea Only  . Indanediones Other (See Comments)    unknown  . Indapamide Other (See Comments)    unknown  . Morphine And Related Nausea And Vomiting  . Sulfa Antibiotics Other (See Comments)    Couldn't breathe pt states that she was in a coma foe 3 weeks   . Benzonatate Itching and Rash    Eyes swelled  . Penicillins Rash  . Septra [Bactrim] Rash    Thank you for allowing pharmacy to be a part of this patient's care.  Reginia Naas 08/21/2017 11:33 AM

## 2017-08-21 NOTE — Evaluation (Signed)
Occupational Therapy Evaluation Patient Details Name: Shirley Strickland MRN: [id number] DOB: 07-09-1916 Today's Date: 08/21/2017    History of Present Illness Pt is a 83 y/o female who presents s/p fall at home while attempting to transfer to her wheelchair. She was brought to the ED and found to have an open trimalleolar ankle fracture dislocation s/p ORIF on 08/20/17. PMH significant for DVT, CHF, CA, significantly HOH (talk into R ear).   Clinical Impression   Patient is s/p ORIF R ankle surgery resulting in functional limitations due to the deficits listed below (see OT problem list). PTA was requiring (A) from family 24/7 at home. Pt's home is very old [employer] and family reports that door frame are too narrow for wheelchair access. Family requesting placement for long term care. They do not want [facility] for rehab or placement.  Patient will benefit from skilled OT acutely to increase independence and safety with ADLS to allow discharge SNF.     Follow Up Recommendations  SNF    Equipment Recommendations  3 in 1 bedside commode;Wheelchair (measurements OT);Wheelchair cushion (measurements OT);Hospital bed    Recommendations for Other Services       Precautions / Restrictions Precautions Precautions: Fall Precaution Comments: Lower legs very sensitive to touch - used sheets wrapped under ankles to pick up and move LE's during session.  Restrictions Weight Bearing Restrictions: Yes RLE Weight Bearing: Non weight bearing      Mobility Bed Mobility Overal bed mobility: Needs Assistance Bed Mobility: Supine to Sit     Supine to sit: Total assist;+2 for physical assistance;HOB elevated     General bed mobility comments: Sheets looped under ankles to assist in lifting, and bed pad used to helicopter transfer pt around to EOB. HOB elevated for assist, and max assist provided at the trunk for elevation to full sitting position.   Transfers Overall transfer level: Needs  assistance Equipment used: 2 person hand held assist Transfers: Sit to/from [facility] Sit to Stand: Max assist;+2 physical assistance;From elevated surface Stand pivot transfers: Max assist;+2 physical assistance;From elevated surface       General transfer comment: VC's for sequencing and general safety. Pt was able to power-up to full standing position with +2 assist, and bed pad cradled under hips for added support as pt pivoted around to the chair.     Balance Overall balance assessment: Needs assistance Sitting-balance support: Feet supported;No upper extremity supported Sitting balance-Leahy Scale: Fair     Standing balance support: No upper extremity supported;During functional activity Standing balance-Leahy Scale: Zero Standing balance comment: +2 assist required.                            ADL either performed or assessed with clinical judgement   ADL Overall ADL's : Needs assistance/impaired Eating/Feeding: Set up;Bed level   Grooming: Set up;Sitting   Upper Body Bathing: Moderate assistance   Lower Body Bathing: Total assistance   Upper Body Dressing : Moderate assistance   Lower Body Dressing: Total assistance   Toilet Transfer: +2 for physical assistance;Maximal assistance;Stand-pivot             General ADL Comments: Pt agreeable to attempt OOB on eval and reports "yall know what you are doing. I dont know> that was better than i ever imaged i could do "     Vision Baseline Vision/History: Wears glasses Wears Glasses: At all times       Perception  Praxis      Pertinent Vitals/Pain Pain Assessment: Faces Faces Pain Scale: Hurts whole lot Pain Location: Bilateral lower legs Pain Descriptors / Indicators: Operative site guarding;Grimacing;Moaning Pain Intervention(s): Limited activity within patient's tolerance;Monitored during session;Repositioned     Hand Dominance Right   Extremity/Trunk Assessment Upper  Extremity Assessment Upper Extremity Assessment: Generalized weakness   Lower Extremity Assessment Lower Extremity Assessment: Defer to PT evaluation RLE Deficits / Details: Acute pain, decreased strength and AROM consistent with above mentioned procedure. RLE: Unable to fully assess due to pain;Unable to fully assess due to immobilization LLE Deficits / Details: Family reports LLE as the "bad leg", and expressed concern with the pt being able to rely on the L foot for support. Pt unable to tolerate light touch to the lower leg and it was difficult to fully assess LLE: Unable to fully assess due to pain   Cervical / Trunk Assessment Cervical / Trunk Assessment: Kyphotic;Other exceptions Cervical / Trunk Exceptions: Forward head/rounded shoulder posture   Communication Communication Communication: HOH(Talk into R ear)   Cognition Arousal/Alertness: Awake/alert Behavior During Therapy: WFL for tasks assessed/performed Overall Cognitive Status: Within Functional Limits for tasks assessed                                     General Comments  pt reports bruising easily and needing care for skin with all contact from staff    Exercises     Shoulder Instructions      Home Living Family/patient expects to be discharged to:: Skilled nursing facility                                 Additional Comments: Pt from home with 3 nieces rotating care. 1 niece, Jackelyn Poling, is caregiver 5 days/week 24 hours, and 2 other nieces alternate care on the weekends.       Prior Functioning/Environment Level of Independence: Needs assistance  Gait / Transfers Assistance Needed: Transfers only  ADL's / Homemaking Assistance Needed: transfered into the bathroom for all care prior to admission. Pt in a home that is more than 81 yo with narrow door frames            OT Problem List: Decreased strength;Decreased activity tolerance;Impaired balance (sitting and/or  standing);Decreased safety awareness;Decreased knowledge of use of DME or AE;Decreased knowledge of precautions;Obesity;Pain      OT Treatment/Interventions: Self-care/ADL training;Therapeutic exercise;DME and/or AE instruction;Therapeutic activities;Patient/family education;Balance training    OT Goals(Current goals can be found in the care plan section) Acute Rehab OT Goals Patient Stated Goal: Get better, back to being able to transfer to her wheelchair OT Goal Formulation: With patient/family Time For Goal Achievement: 09/04/17 Potential to Achieve Goals: Good  OT Frequency: Min 2X/week   Barriers to D/C: Inaccessible home environment          Co-evaluation PT/OT/SLP Co-Evaluation/Treatment: Yes Reason for Co-Treatment: Complexity of the patient's impairments (multi-system involvement);For patient/therapist safety;To address functional/ADL transfers PT goals addressed during session: Mobility/safety with mobility;Balance OT goals addressed during session: ADL's and self-care;Proper use of Adaptive equipment and DME;Strengthening/ROM      AM-PAC PT "6 Clicks" Daily Activity     Outcome Measure Help from another person eating meals?: None Help from another person taking care of personal grooming?: A Little Help from another person toileting, which includes using toliet, bedpan, or urinal?: A  Lot Help from another person bathing (including washing, rinsing, drying)?: A Lot Help from another person to put on and taking off regular upper body clothing?: A Lot Help from another person to put on and taking off regular lower body clothing?: A Lot 6 Click Score: 15   End of Session Equipment Utilized During Treatment: Gait belt Nurse Communication: Mobility status;Precautions  Activity Tolerance: Patient tolerated treatment well Patient left: in chair;with call bell/phone within reach;with family/visitor present;with nursing/sitter in room  OT Visit Diagnosis: Unsteadiness on feet  (R26.81);Muscle weakness (generalized) (M62.81)                Time: 2481-8590 OT Time Calculation (min): 36 min Charges:  OT General Charges $OT Visit: 1 Visit OT Evaluation $OT Eval High Complexity: 1 High G-CodesJeri Modena   OTR/L Pager: [phone] Office: [phone] .   Parke Poisson B 08/21/2017, 1:50 PM

## 2017-08-21 NOTE — [place] FL2 (Signed)
[place] MEDICAID FL2 LEVEL OF CARE SCREENING TOOL     IDENTIFICATION  Patient Name: Shirley Strickland Birthdate: June 25, 1916 Sex: female Admission Date (Current Location): 08/20/2017  [facility] and [place] Number:  Herbalist and Address:  [facility] [place]. [facility], [place] [address], [place],  [address]      Provider Number: [id number]  Attending Physician Name and Address:  Lucious Groves, DO  Relative Name and Phone Number:  Cathie Beams [phone]    Current Level of Care: Hospital Recommended Level of Care: [place] Prior Approval Number:    Date Approved/Denied:   PASRR Number: [id number]  Discharge Plan: SNF    Current Diagnoses: Patient Active Problem List   Diagnosis Date Noted  . Displaced trimalleolar fracture of right lower leg, initial encounter for open fracture type IIIA, IIIB, or IIIC 08/20/2017  . Hypothyroidism   . Elevated troponin   . AKI (acute kidney injury) ([place])   . Non-ST elevation MI (NSTEMI) ([place])   . Demand ischemia of myocardium ([place]) 06/08/2017  . Chest pain 06/07/2017  . DVT (deep venous thrombosis)-left 12/28/2013  . Acute deep vein thrombosis (DVT) of femoral vein of left lower extremity ([place]) 09/29/2013  . Anemia 09/29/2013  . Cellulitis 09/23/2013  . Hyponatremia 08/31/2013  . UTI (urinary tract infection) 08/31/2013  . Chronic diastolic heart failure ([place]) 08/25/2013  . Left fibular fracture 08/24/2013  . Laceration of left leg 08/23/2013  . Syncope and collapse 08/23/2013  . HTN (hypertension) 08/23/2013  . Fall at home 08/23/2013  . Sacral insufficiency fracture 07/07/2011  . Fall   . Atrial fibrillation with RVR (HCC)     Orientation RESPIRATION BLADDER Height & Weight     Self, Time, Situation, Place  Normal Continent Weight: 141 lb (64 kg) Height:  5\' 3"  (160 cm)  BEHAVIORAL SYMPTOMS/MOOD NEUROLOGICAL BOWEL NUTRITION STATUS      Continent Diet(See DC Summary)   AMBULATORY STATUS COMMUNICATION OF NEEDS Skin   Extensive Assist Verbally Surgical wounds                       Personal Care Assistance Level of Assistance  Bathing, Feeding, Dressing Bathing Assistance: Maximum assistance Feeding assistance: Independent Dressing Assistance: Maximum assistance     Functional Limitations Info  Sight, Hearing, Speech Sight Info: Adequate Hearing Info: Adequate Speech Info: Adequate    SPECIAL CARE FACTORS FREQUENCY  OT (By licensed OT), PT (By licensed PT)     PT Frequency: 5x week OT Frequency: 5x week            Contractures Contractures Info: Not present    Additional Factors Info  Code Status, Allergies Code Status Info: DNR Allergies Info: ROCEPHIN CEFTRIAXONE SODIUM IN DEXTROSE, CLARITHROMYCIN, COD FISH ALLERGY, CODEINE, DARVOCET PROPOXYPHENE N-ACETAMINOPHEN, DEMADEX TORSEMIDE, DIOVAN HCT VALSARTAN-HYDROCHLOROTHIAZIDE, EX-LAX SENNA, IBUPROFEN, INDANEDIONES, INDAPAMIDE, MORPHINE AND RELATED, SULFA ANTIBIOTICS, BENZONATATE, PENICILLINS, SEPTRA BACTRIM            Current Medications (08/21/2017):  This is the current hospital active medication list Current Facility-Administered Medications  Medication Dose Route Frequency Provider Last Rate Last Dose  . ALPRAZolam Duanne Moron) tablet 0.5 mg  0.5 mg Oral Q8H PRN Leandrew Koyanagi, MD      . aspirin EC tablet 81 mg  81 mg Oral Daily Leandrew Koyanagi, MD   81 mg at 08/21/17 1011  . chlorhexidine (HIBICLENS) 4 % liquid 4 application  60 mL Topical Once Silvestre Gunner  J, PA-C      . cholecalciferol (VITAMIN D) tablet 2,000 Units  2,000 Units Oral Daily Leandrew Koyanagi, MD   2,000 Units at 08/21/17 1011  . ciprofloxacin (CIPRO) IVPB 200 mg  200 mg Intravenous Q24H Reginia Naas, RPH      . diphenhydrAMINE (BENADRYL) injection 12.5 mg  12.5 mg Intravenous Q6H PRN Santos-Sanchez, Merlene Morse, MD      . fentaNYL (SUBLIMAZE) injection 12.5 mcg  12.5 mcg Intravenous Q2H PRN Tawny Asal, MD    12.5 mcg at 93/23/55 7322  . folic acid (FOLVITE) tablet 1 mg  1 mg Oral Daily Ledell Noss, MD   1 mg at 08/21/17 1011  . lactated ringers infusion   Intravenous Continuous Raelene Bott, MD 75 mL/hr at 08/21/17 0330    . levothyroxine (SYNTHROID, LEVOTHROID) tablet 44 mcg  44 mcg Oral Orbie Hurst, MD   Stopped at 08/21/17 0254  . metoCLOPramide (REGLAN) injection 5 mg  5 mg Intravenous Q8H PRN Ledell Noss, MD      . metoprolol tartrate (LOPRESSOR) tablet 12.5 mg  12.5 mg Oral BID Ledell Noss, MD   12.5 mg at 08/21/17 1011  . multivitamin with minerals tablet 1 tablet  1 tablet Oral Daily Leandrew Koyanagi, MD   1 tablet at 08/21/17 1011  . povidone-iodine 10 % swab 2 application  2 application Topical Once Lisette Abu, PA-C      . senna-docusate (Senokot-S) tablet 1 tablet  1 tablet Oral BID Ledell Noss, MD   1 tablet at 08/21/17 1011  . thiamine (VITAMIN B-1) tablet 100 mg  100 mg Oral Daily Leandrew Koyanagi, MD   100 mg at 08/21/17 1011  . vancomycin (VANCOCIN) 500 mg in sodium chloride 0.9 % 100 mL IVPB  500 mg Intravenous Q24H Reginia Naas, RPH      . vitamin B-12 (CYANOCOBALAMIN) tablet 1,000 mcg  1,000 mcg Oral Daily Ledell Noss, MD   1,000 mcg at 08/21/17 1011     Discharge Medications: Please see discharge summary for a list of discharge medications.  Relevant Imaging Results:  Relevant Lab Results:   Additional Information SS#: [id number]  Normajean Baxter, LCSW

## 2017-08-21 NOTE — Progress Notes (Signed)
Subjective:  Ms. Chojnowski was seen lying in bed in NAD. Her niece and adopted niece were at bedside. She denies pain in her right lower extremity, states that she did have a sharp radiating pain briefly, but that is now resolved. She did receive nerve block with Anesthesia for surgery yesterday.  Patient had a ?allergic reaction yesterday, complaining of itching on bilateral hands and forearms with feeling that her throat was swelling. She also states that she could not speak yesterday. She had received two doses of clindamycin. Clindamycin is not on her allergy list, but we will continue to hold this for possible allergy. Reports that her breathing is improved today and she is able to speak more clearly today, but not quite back at baseline. Able to tolerate PO intake and secretions. Received IV solumedrol and benadryl.  Objective:  Vital signs in last 24 hours: Vitals:   08/21/17 0316 08/21/17 0330 08/21/17 0644 08/21/17 0800  BP: (!) 182/85 (!) 188/58  (!) 183/72  Pulse: 92 88 87 90  Resp:      Temp: 98.7 F (37.1 C)   99.3 F (37.4 C)  TempSrc: Oral   Oral  SpO2: 98% 100% 100% 99%  Weight:      Height:       GEN: Pleasant elderly female lying in bed in NAD. Alert and oriented. RESP: Clear to auscultation bilaterally. No wheezes, rales, or rhonchi. No increased work of breathing. CV: Normal rate and regular rhythm. No murmurs, gallops, or rubs. No LE edema. ABD: Soft. Non-tender. Non-distended. Normoactive bowel sounds. EXT: No clubbing, cyanosis, or edema. Warm and well perfused. SKIN: No hives evident, although she does have senile purpura on bilateral forearms and small areas of erythema on her bilateral hands and forearms. NEURO: Cranial nerves II-XII grossly intact. Able to lift all four extremities against gravity. No apparent audiovisual hallucinations. Speech fluent and appropriate. PSYCH: Patient is calm and pleasant. Appropriate affect. Well-groomed; speech is appropriate and  on-subject.  Assessment/Plan:  Ms. Reichl is a 83 yo F with a History of Atrial fibrillation, NSTEMI (suspected type II), CHF, HTN, DVT (IVC filter in place), osteoporosis, and hypothyroidism presents with right ankle fracture following a fall.  Type IIIa open right trimalleolar ankle fracture dislocation after fall, s/p debridement and ORIF 12/27. Dislocation reduced in ED 12/27. Had nerve block yesterday. Orthopedic surgery performed debridement and ORIF without fixation of posterior malleolus on 12/27. Had ?allergic reaction to Clindamycin, will hold Clindamycin. Given her allergies to other antibiotics, this was discussed with ID Pharmacist, who recommended Vancomycin + a fluoroquinolone if concerned for Gram negatives/Pseudomonas. - Orthopedic Surgery consulted; appreciate their recommendations - Nonweightbearing on RLE for ~6-8 weeks depending on evidence of healing - Clindamycin discontinued - Will give Vancomycin + Ciprofloxacin x48h for prophylaxis of Type IIIa open fracture - Fentanyl 12.5mcg IV q2h PRN Severe pain - SW consulted for SNF placement - Will need SNF placement and short-term rehab prior to re-enrolling in hospice  Pruritus and sensation of throat swelling On morning of 12/28. Possibly secondary to clindamycin, although this would be a delayed reaction given that she did receive a dose of clindamycin earlier yesterday. Vitals stable and was treated with IV Benadryl x2, IV solumedrol, and IV famotidine. Patient reports itching and sensation of throat swelling are improved but not completely resolved. No hives or stridor on exam. Tolerating PO intake and secretions, although states that her speech is not quite back to normal. Patient requests 1 dose of prednisone, because  she states this is what she normally takes when she has allergic reactions and it seems to help. - Benadryl PRN for itching - Prednisone 10mg  x1 dose - D/c clindamycin - Continue to monitor  Atrial  Fibrillation Rate controlled on metoprolol. CHADS2-VASc score of at least 6, with 13.6% annual risk of CVA. Patient not currently on anticoagulation secondary to prior GI bleeds with anticoagulation that required hospitalization and pRBC transfusions. - Continue home Metoprolol 12.5mg  BID  Hypothyroidism - Continue home Synthroid 57mcg Daily  Dispo: Anticipated discharge in approximately 1-2 days.  Colbert Ewing, MD 08/21/2017, 11:17 AM Pager: Mamie Nick [phone]

## 2017-08-21 NOTE — Progress Notes (Addendum)
Notified of potential allergic reaction with pt complaining of itching and sensation of throat swelling. Rapid response had initiated immediate care.   The pt acutely began to complain of itching, primarily to bilateral hands and the sensation of her throat swelling with difficulty speaking. She was able to manage her own secretions, no stridor noted initially or with ongoing examinations and lung sounds were clear bilaterally without wheezing. She had no hives or other acute skin changes noted. Her airway was difficult to visualize on exam but appeared to be due more to pt effort and anatomy with no abnormal fullness or swelling noted to tongue or visualized soft palate.  Her vital signs were stable with initial SBP in 160s, maintaining her oxygen saturations on [place] at time of our evaluation (initially on nrb).  She has various allergies listed and has not received any known offending agents, no other allergens on review of care everywhere. On review of MAR, there are no obvious offenders and this reaction would be fairly delayed based on last doses of medications (fentanyl, clindamycin 2-3 hrs prior, tolerated those medicines earlier this admission), no medications recently newly started for first time dose. She was treated with IV Benadryl 12.5 mg x2, IV Solumedrol 80 mg, and IV famotidine 20 mg. After several minutes, she stated her itching was improved and she overall felt better though did not feel her speaking was at baseline. We will continue to monitor closely for any worsening in her respiratory status or symptoms-increased level of cardiac and vitals monitoring. At this time, will hold her next dose of clindamycin and reassess.

## 2017-08-22 ENCOUNTER — Inpatient Hospital Stay (HOSPITAL_COMMUNITY): Payer: Medicare Other

## 2017-08-22 LAB — URINALYSIS, ROUTINE W REFLEX MICROSCOPIC
BILIRUBIN URINE: NEGATIVE
Bacteria, UA: NONE SEEN
GLUCOSE, UA: NEGATIVE mg/dL
HGB URINE DIPSTICK: NEGATIVE
KETONES UR: NEGATIVE mg/dL
LEUKOCYTES UA: NEGATIVE
NITRITE: NEGATIVE
PH: 5 (ref 5.0–8.0)
PROTEIN: 100 mg/dL — AB
Specific Gravity, Urine: 1.024 (ref 1.005–1.030)
WBC, UA: NONE SEEN WBC/hpf (ref 0–5)

## 2017-08-22 LAB — BASIC METABOLIC PANEL
ANION GAP: 6 (ref 5–15)
BUN: 27 mg/dL — ABNORMAL HIGH (ref 6–20)
CALCIUM: 8.8 mg/dL — AB (ref 8.9–10.3)
CO2: 24 mmol/L (ref 22–32)
Chloride: 102 mmol/L (ref 101–111)
Creatinine, Ser: 1.48 mg/dL — ABNORMAL HIGH (ref 0.44–1.00)
GFR, EST AFRICAN AMERICAN: 32 mL/min — AB (ref >60)
GFR, EST NON AFRICAN AMERICAN: 28 mL/min — AB (ref >60)
Glucose, Bld: 119 mg/dL — ABNORMAL HIGH (ref 65–99)
POTASSIUM: 4.7 mmol/L (ref 3.5–5.1)
SODIUM: 132 mmol/L — AB (ref 135–145)

## 2017-08-22 MED ORDER — SODIUM CHLORIDE 0.9 % IV SOLN
INTRAVENOUS | Status: AC
  Administered 2017-08-22: 14:00:00 via INTRAVENOUS

## 2017-08-22 MED ORDER — VANCOMYCIN HCL IN DEXTROSE 750-5 MG/150ML-% IV SOLN
750.0000 mg | INTRAVENOUS | Status: AC
  Administered 2017-08-23: 750 mg via INTRAVENOUS
  Filled 2017-08-22: qty 150

## 2017-08-22 MED ORDER — ASPIRIN EC 325 MG PO TBEC
325.0000 mg | DELAYED_RELEASE_TABLET | Freq: Every day | ORAL | Status: DC
  Administered 2017-08-22: 325 mg via ORAL
  Filled 2017-08-22: qty 1

## 2017-08-22 MED ORDER — SODIUM CHLORIDE 0.9 % IV SOLN: INTRAVENOUS | Status: DC

## 2017-08-22 NOTE — Progress Notes (Signed)
Medicine attending:  Clinical status and database reviewed with resident physician Dr. Colbert Ewing and I concur with her evaluation and management plan.  83 year old woman admitted after sustaining an open fracture of her right ankle requiring ORIF.  She has multiple antibiotic allergies and has already had a reaction to clindamycin during this admission.  She is currently on vancomycin and Cipro.  Low-grade temperature 100.4 degrees.  Lethargic but following commands. We will continue current management plan.

## 2017-08-22 NOTE — Progress Notes (Addendum)
Subjective:  Ms. Everding was seen lying in bed in NAD. She is more sleepy today on exam, although able to follow commands. She reports some pain in her lower extremity, but denies chest pain, shortness of breath, or fevers/chills. States the pruritus on her hands and arms are improved. Niece states that she is more out of it than normal.  Objective:  Vital signs in last 24 hours: Vitals:   08/21/17 1159 08/21/17 2039 08/22/17 0354 08/22/17 0637  BP: (!) 153/55 (!) 145/62 (!) 147/80 117/82  Pulse: 63 88 100 92  Resp: 18 18 17 18   Temp: 98.6 F (37 C) 98.1 F (36.7 C) (!) 100.4 F (38 C) 99.4 F (37.4 C)  TempSrc: Oral Oral Axillary Axillary  SpO2: 93% 99% 93% 98%  Weight:      Height:       GEN: Pleasant elderly female lying in bed in NAD. A little sleepy but follows commands appropriately. Hard of hearing. RESP: Clear to auscultation bilaterally. No wheezes, rales, or rhonchi. No increased work of breathing. CV: Normal rate and regular rhythm. No murmurs, gallops, or rubs. No LE edema. ABD: Soft. Non-tender. Non-distended. Normoactive bowel sounds. EXT: Able to wiggle toes on BLE. RLE in clean and dry cast and dressing. SKIN: Senile purpura on bilateral forearms. Erythematous rash improved since yesterday. NEURO: Cranial nerves II-XII grossly intact. Able to lift all four extremities against gravity. No apparent audiovisual hallucinations. Speech fluent and appropriate. PSYCH: Patient is calm and pleasant. Appropriate affect. Well-groomed; speech is appropriate and on-subject.  Assessment/Plan:  Ms. Harrell is a 83 yo F with a History of Atrial fibrillation, NSTEMI (suspected type II), CHF, HTN, DVT (IVC filter in place), osteoporosis, and hypothyroidism presents with right ankle fracture following a fall, now s/p debridement and ORIF 12/27 with Orthopedic Surgery.  Type IIIa open right trimalleolar ankle fracture dislocation after fall, s/p debridement and ORIF without fixation of  posterior malleolus 12/27 and reduction of dislocation on 12/27. Given her allergies to other antibiotics, she will continue Vancomycin and ciprofloxacin for another 24h (72h total, 12/27-12/30). Temperature to 100.4 this morning. WBC elevated, although in setting of steroid administration. Patient more sleepy on exam today. Did receive benadryl and xanax at 3am this morning, as well as fentanyl at 9pm last night. Sleepiness possibly related to medications? - Orthopedic Surgery consulted; appreciate their recommendations - Nonweightbearing on RLE for ~6-8 weeks depending on evidence of healing - Will follow up with Ortho in 1 week for wound check - Continue IV Vancomycin 500mg  qday + Ciprofloxacin 200mg  qday for 72h (stop date 12/30) for prophylaxis of Type IIIa open fracture - Fentanyl 12.79mcg IV q2h PRN for severe pain - SW consulted for SNF placement - Will need SNF placement and short-term rehab prior to re-enrolling in hospice - UA - CXR - Continue to monitor temperature - If spikes another fever, will get BCx and add flagyl (for anaerobic coverage) - CBC tomorrow AM - D/c xanax, d/c benadryl - Delirium precautions  AKI Cr 1.48, baseline is 0.9-1. - BMET tomorrow AM - IV NS 133mL/hr  Pruritus and sensation of throat swelling, resolved after steroids, famotidine, and benadryl Rash improved. Possibly secondary to clindamycin. Clindamycin discontinued. Now on Vanc/Cipro.  - Benadryl PRN for itching - D/c clindamycin - Continue to monitor  Atrial Fibrillation Rate controlled on metoprolol. CHADS2-VASc score of at least 6, with 13.6% annual risk of CVA. Patient not currently on anticoagulation secondary to prior GI bleeds with anticoagulation that required  hospitalization and pRBC transfusions. - Continue home Metoprolol 12.5mg  BID  Hypothyroidism - Continue home Synthroid 15mcg Daily  VTE PPx: SCDs, aspirin 325mg  daily Dispo: Anticipated discharge in approximately 1-2  days.  Colbert Ewing, MD 08/22/2017, 7:32 AM Pager: Mamie Nick [phone]

## 2017-08-22 NOTE — Progress Notes (Addendum)
Call Time 2211  RN called about patient's aspirating after taking PO pills. Patient did have an allergic on Thursday night and was treated for it.  VSS are stable, not in respiratory distress, lung sounds clear, no stridor. Patient is slightly confused but can be reoriented quickly.  I ordered a CXR and RN paged IMTS MDs.  Patient is a DNR, I did suction the patient but no secretions obtained, patient is coughing but swallowing her secretions, I asked the patient get a flutter valve and IS and RNs to help with pulmonary toileting.  I left to attend another RRT call. RN to follow up with MDs.   End Time 2250

## 2017-08-22 NOTE — Progress Notes (Signed)
   Subjective:  Patient reports pain as mild.  No events.  Objective:   VITALS:   Vitals:   08/21/17 1159 08/21/17 2039 08/22/17 0354 08/22/17 0637  BP: (!) 153/55 (!) 145/62 (!) 147/80 117/82  Pulse: 63 88 100 92  Resp: 18 18 17 18   Temp: 98.6 F (37 C) 98.1 F (36.7 C) (!) 100.4 F (38 C) 99.4 F (37.4 C)  TempSrc: Oral Oral Axillary Axillary  SpO2: 93% 99% 93% 98%  Weight:      Height:        Neurovascular intact Sensation intact distally Intact pulses distally Incision: dressing C/D/I and no drainage No cellulitis present Compartment soft   Lab Results  Component Value Date   WBC 11.2 (H) 08/21/2017   HGB 9.4 (L) 08/21/2017   HCT 29.2 (L) 08/21/2017   MCV 86.9 08/21/2017   PLT 230 08/21/2017     Assessment/Plan:  2 Days Post-Op   - Expected postop acute blood loss anemia - will monitor for symptoms - Up with PT/OT - DVT ppx - SCDs, ambulation, aspirin 325 daily - NWB operative extremity 6-8 weeks - Pain control - Discharge planning - will need SNF - f/u in 1 week for wound check - vanc and cipro x 72 hrs  Eduard Roux 08/22/2017, 7:44 AM [phone]

## 2017-08-22 NOTE — Progress Notes (Signed)
Pharmacy Antibiotic Note  Shirley Strickland is a 83 y.o. female admitted on 08/20/2017 with open ankle fracture.  Pharmacy has been consulted for vancomycin and ciprofloxacin dosing for surgical prophylaxis.  Patient has AKI.  She remains afebrile and her WBC is mildly elevated   Plan: Change vanc to 750mg  IV Q48H Continue Cipro 200mg  IV Q24H Monitor renal fxn, clinical progress F/U with order to d/c abx post 72 hrs, per MD note.   Consider changing LR to NS and increase rate   Height: 5\' 3"  (160 cm) Weight: 141 lb (64 kg) IBW/kg (Calculated) : 52.4  Temp (24hrs), Avg:99.1 F (37.3 C), Min:98.1 F (36.7 C), Max:100.4 F (38 C)  Recent Labs  Lab 08/20/17 1046 08/20/17 1058 08/21/17 0557 08/22/17 0738  WBC 10.5  --  11.2*  --   CREATININE 0.93 0.90 0.93 1.48*    Estimated Creatinine Clearance: 18.6 mL/min (A) (by C-G formula based on SCr of 1.48 mg/dL (H)).    Allergies  Allergen Reactions  . Rocephin [Ceftriaxone Sodium In Dextrose]     Unable to breath  . Clarithromycin Nausea Only  . Cod [employer] Allergy] Other (See Comments)    Stomach pains and passed out recently - doesn't remember reaction before this weekend  . Codeine Nausea And Vomiting  . Darvocet [Propoxyphene N-Acetaminophen] Nausea And Vomiting  . Demadex [Torsemide] Hives and Nausea And Vomiting    UNKNOWN  . Diovan Hct [Valsartan-Hydrochlorothiazide] Other (See Comments)    Nausea, sick, hives  06-07-17 PATIENT IS CURRENTLY ON THIS MEDICATION, NO SIDE EFFECTS  . Ex-Lax [Senna] Other (See Comments)    unknown  . Ibuprofen Nausea Only  . Indanediones Other (See Comments)    unknown  . Indapamide Other (See Comments)    unknown  . Morphine And Related Nausea And Vomiting  . Sulfa Antibiotics Other (See Comments)    Couldn't breathe pt states that she was in a coma foe 3 weeks   . Benzonatate Itching and Rash    Eyes swelled  . Penicillins Rash  . Septra [Bactrim] Rash     Cipro 12/28 >> Vanc  12/28 >>   Jaymond Waage D. Mina Marble, PharmD, BCPS Pager:  [phone] 08/22/2017, 9:33 AM

## 2017-08-22 NOTE — Progress Notes (Signed)
Patient woke up from sleep confused. Patient stated that she wanted to call  her daughter .Patient is disoriented to place and time. Patient is anxious. PRN Xanax  Given.

## 2017-08-23 ENCOUNTER — Inpatient Hospital Stay (HOSPITAL_COMMUNITY): Payer: Medicare Other

## 2017-08-23 DIAGNOSIS — S82851F Displaced trimalleolar fracture of right lower leg, subsequent encounter for open fracture type IIIA, IIIB, or IIIC with routine healing: Secondary | ICD-10-CM

## 2017-08-23 DIAGNOSIS — L299 Pruritus, unspecified: Secondary | ICD-10-CM

## 2017-08-23 DIAGNOSIS — T17908A Unspecified foreign body in respiratory tract, part unspecified causing other injury, initial encounter: Secondary | ICD-10-CM

## 2017-08-23 DIAGNOSIS — Z8719 Personal history of other diseases of the digestive system: Secondary | ICD-10-CM

## 2017-08-23 DIAGNOSIS — I214 Non-ST elevation (NSTEMI) myocardial infarction: Secondary | ICD-10-CM

## 2017-08-23 DIAGNOSIS — T17990A Other foreign object in respiratory tract, part unspecified in causing asphyxiation, initial encounter: Secondary | ICD-10-CM

## 2017-08-23 DIAGNOSIS — X58XXXA Exposure to other specified factors, initial encounter: Secondary | ICD-10-CM

## 2017-08-23 DIAGNOSIS — H9190 Unspecified hearing loss, unspecified ear: Secondary | ICD-10-CM

## 2017-08-23 DIAGNOSIS — M810 Age-related osteoporosis without current pathological fracture: Secondary | ICD-10-CM

## 2017-08-23 DIAGNOSIS — Z9889 Other specified postprocedural states: Secondary | ICD-10-CM

## 2017-08-23 DIAGNOSIS — S82899A Other fracture of unspecified lower leg, initial encounter for closed fracture: Secondary | ICD-10-CM

## 2017-08-23 LAB — BASIC METABOLIC PANEL
ANION GAP: 7 (ref 5–15)
BUN: 38 mg/dL — ABNORMAL HIGH (ref 6–20)
CALCIUM: 8.4 mg/dL — AB (ref 8.9–10.3)
CO2: 23 mmol/L (ref 22–32)
CREATININE: 2.61 mg/dL — AB (ref 0.44–1.00)
Chloride: 100 mmol/L — ABNORMAL LOW (ref 101–111)
GFR calc Af Amer: 16 mL/min — ABNORMAL LOW (ref >60)
GFR, EST NON AFRICAN AMERICAN: 14 mL/min — AB (ref >60)
GLUCOSE: 112 mg/dL — AB (ref 65–99)
Potassium: 5.1 mmol/L (ref 3.5–5.1)
Sodium: 130 mmol/L — ABNORMAL LOW (ref 135–145)

## 2017-08-23 LAB — CBC
HEMATOCRIT: 23.7 % — AB (ref 36.0–46.0)
Hemoglobin: 7.7 g/dL — ABNORMAL LOW (ref 12.0–15.0)
MCH: 28 pg (ref 26.0–34.0)
MCHC: 32.5 g/dL (ref 30.0–36.0)
MCV: 86.2 fL (ref 78.0–100.0)
PLATELETS: 177 10*3/uL (ref 150–400)
RBC: 2.75 MIL/uL — ABNORMAL LOW (ref 3.87–5.11)
RDW: 15 % (ref 11.5–15.5)
WBC: 14.3 10*3/uL — AB (ref 4.0–10.5)

## 2017-08-23 LAB — SODIUM, URINE, RANDOM: Sodium, Ur: 94 mmol/L

## 2017-08-23 LAB — CREATININE, URINE, RANDOM: Creatinine, Urine: 161.96 mg/dL

## 2017-08-23 MED ORDER — SODIUM CHLORIDE 0.9 % IV SOLN
INTRAVENOUS | Status: AC
  Administered 2017-08-23 (×2): via INTRAVENOUS

## 2017-08-23 MED ORDER — ENOXAPARIN SODIUM 30 MG/0.3ML ~~LOC~~ SOLN
30.0000 mg | SUBCUTANEOUS | Status: DC
  Administered 2017-08-23 – 2017-08-29 (×7): 30 mg via SUBCUTANEOUS
  Filled 2017-08-23 (×7): qty 0.3

## 2017-08-23 MED ORDER — FENTANYL CITRATE (PF) 100 MCG/2ML IJ SOLN
12.5000 ug | INTRAMUSCULAR | Status: AC | PRN
  Administered 2017-08-23 – 2017-08-24 (×4): 12.5 ug via INTRAVENOUS
  Filled 2017-08-23 (×4): qty 2

## 2017-08-23 MED ORDER — RESOURCE THICKENUP CLEAR PO POWD
ORAL | Status: DC | PRN
  Filled 2017-08-23: qty 125

## 2017-08-23 NOTE — Progress Notes (Signed)
Subjective:  Overnight, patient reportedly aspirated on senokot pill. Vital signs stable. Ms. Cogliano was sitting up in bed this morning. She is more alert. Denies shortness of breath. She states she has not been able to urinate. Denies dysuria or abdominal pain. Bladder scan showed 24cc.  Objective:  Vital signs in last 24 hours: Vitals:   08/22/17 1500 08/22/17 2155 08/22/17 2231 08/23/17 0528  BP: (!) 123/38 (!) 131/39 (!) 125/46 129/60  Pulse: 79 91 97 88  Resp: 18  20 20   Temp: 98.2 F (36.8 C) 98.3 F (36.8 C) 99 F (37.2 C) 98.6 F (37 C)  TempSrc: Oral  Oral Oral  SpO2: 93% 98% 100% 97%  Weight:      Height:       GEN: Pleasant elderly female lying in bed in NAD. Alert. Hard of hearing RESP: Clear to auscultation bilaterally. No wheezes, rales, or rhonchi. No increased work of breathing. CV: Normal rate and regular rhythm. No murmurs, gallops, or rubs. No LE edema. ABD: Soft. Non-tender. Non-distended. Normoactive bowel sounds. EXT: RLE in clean and dry cast and dressing. SKIN: Senile purpura on bilateral forearms. BLE skin very tender (chronic). NEURO: Cranial nerves II-XII grossly intact. Able to lift all four extremities against gravity. No apparent audiovisual hallucinations. Speech fluent and appropriate. PSYCH: Patient is calm and pleasant. Appropriate affect. Well-groomed; speech is appropriate and on-subject.  Labs CBC Latest Ref Rng & Units 08/23/2017 08/21/2017 08/20/2017  WBC 4.0 - 10.5 K/uL 14.3(H) 11.2(H) -  Hemoglobin 12.0 - 15.0 g/dL 7.7(L) 9.4(L) 10.9(L)  Hematocrit 36.0 - 46.0 % 23.7(L) 29.2(L) 32.0(L)  Platelets 150 - 400 K/uL 177 230 -   CMP Latest Ref Rng & Units 08/23/2017 08/22/2017 08/21/2017  Glucose 65 - 99 mg/dL 112(H) 119(H) 129(H)  BUN 6 - 20 mg/dL 38(H) 27(H) 16  Creatinine 0.44 - 1.00 mg/dL 2.61(H) 1.48(H) 0.93  Sodium 135 - 145 mmol/L 130(L) 132(L) 134(L)  Potassium 3.5 - 5.1 mmol/L 5.1 4.7 4.7  Chloride 101 - 111 mmol/L 100(L) 102  101  CO2 22 - 32 mmol/L 23 24 26   Calcium 8.9 - 10.3 mg/dL 8.4(L) 8.8(L) 9.3  Total Protein 6.5 - 8.1 g/dL - - -  Total Bilirubin 0.3 - 1.2 mg/dL - - -  Alkaline Phos 38 - 126 U/L - - -  AST 15 - 41 U/L - - -  ALT 14 - 54 U/L - - -   UA with ur prot 100  Renal U/S 12/30 1. No mass or hydronephrosis. 2. Bilateral renal cortical volume loss.  Assessment/Plan:  Ms. Pair is a 83 yo F with a History of Atrial fibrillation, NSTEMI (suspected type II), CHF, HTN, DVT (IVC filter in place), osteoporosis, and hypothyroidism presents with right ankle fracture following a fall, now s/p debridement and ORIF 12/27 with Orthopedic Surgery.  Type IIIa open right trimalleolar ankle fracture dislocation after fall, s/p debridement and ORIF without fixation of posterior malleolus 12/27 and reduction of dislocation on 12/27. Given her allergies to other antibiotics, she will continue Vancomycin and ciprofloxacin for another 24h (72h total, 12/27-12/30). Afebrile. WBC elevated, although in setting of steroid administration. Xanax and benadryl discontinued yesterday and patient is more alert this morning. Hb down to 7.7 (baseline 8.5-10). Possibly post-op anemia. Will continue to monitor. - Orthopedic Surgery consulted; appreciate their recommendations - Nonweightbearing on RLE for ~6-8 weeks depending on evidence of healing - Will follow up with Ortho in 1 week for wound check - Continue IV Vancomycin  750mg  q48h + Ciprofloxacin 200mg  qday for 72h (last dose today) for prophylaxis of Type IIIa open fracture - Fentanyl 12.37mcg IV q2h PRN for severe pain - SW consulted for SNF placement - Will need SNF placement and short-term rehab prior to re-enrolling in hospice - Continue to monitor temperature - If spikes another fever, will get BCx and add flagyl (for anaerobic coverage) - Delirium precautions - CBC in AM  AKI Cr 1.48 -> 2.61, baseline is 0.9-1. Likely secondary to volume depletion, BUN also elevated  at 38. Patient denies dysuria or abdominal pain. UA without evidence of UTI. Renal ultrasound is negative for obstruction. Bladder scan with 24cc. - BMET tomorrow AM - FENa - Insert foley catheter - IV NS 50cc/hr x24h  Pruritus and sensation of throat swelling, resolved after steroids, famotidine, and benadryl Possibly secondary to clindamycin, which has now been discontinued. - benadryl d/c'ed 2/2 sleepiness - Continue to monitor  Atrial Fibrillation Rate controlled on metoprolol. CHADS2-VASc score of at least 6, with 13.6% annual risk of CVA. Patient not currently on anticoagulation secondary to prior GI bleeds with anticoagulation that required hospitalization and pRBC transfusions. - Continue home Metoprolol 12.5mg  BID  Hypothyroidism - Continue home Synthroid 68mcg Daily  VTE PPx: SCDs, low dose Lovenox Dispo: Anticipated discharge in approximately 1-2 days.  Colbert Ewing, MD 08/23/2017, 10:23 AM Pager: Mamie Nick [phone]

## 2017-08-23 NOTE — Progress Notes (Signed)
Medicine attending: I examined this patient today together with resident physician Dr. Merri Ray and I concur with her evaluation and management plan which we discussed together. The patient aspirated a Senokot laxative tablet last evening.  I was called by the nurses.  She had some transient coughing but vital signs otherwise stable, she maintained good oxygenation and symptoms resolved.  Since this is a vegetable fiber laxative, and she was in no distress, I did not feel that we needed to involve pulmonary medicine for bronchoscopic retrieval. Her kidney function has taken a turn for the worse with rise in both BUN and creatinine over the last 24 hours.  Renal ultrasound shows no signs of obstruction and bladder appears normal.  IV fluids were initiated yesterday when creatinine and BUN began to rise.  It is most likely that the renal dysfunction is due to dehydration.  There was a transient fall in her blood pressure yesterday which may have contributed to the renal dysfunction by adding an element of hypoperfusion.  There is a lag in recovery but I anticipate function will improve with continued hydration. Hemoglobin has fallen concomitant with rising creatinine and the 2 are likely related.  No signs of acute blood loss.  To minimize risk of gastritis, we are going to stop her aspirin.  Use low-dose low molecular weight heparin for thromboprophylaxis as she recuperates from recent ankle fracture surgery.

## 2017-08-23 NOTE — Progress Notes (Signed)
Bladder scanned patient, 146mL scanned. Pt has no complaints of pressure or pain in the abdominal area. Patient refused in and out catheter at this time. Will continue to monitor patient.

## 2017-08-23 NOTE — Progress Notes (Signed)
   Subjective:  Patient reports pain as mild.  No events.  Objective:   VITALS:   Vitals:   08/22/17 1500 08/22/17 2155 08/22/17 2231 08/23/17 0528  BP: (!) 123/38 (!) 131/39 (!) 125/46 129/60  Pulse: 79 91 97 88  Resp: 18  20 20   Temp: 98.2 F (36.8 C) 98.3 F (36.8 C) 99 F (37.2 C) 98.6 F (37 C)  TempSrc: Oral  Oral Oral  SpO2: 93% 98% 100% 97%  Weight:      Height:        Neurovascular intact Sensation intact distally Intact pulses distally Incision: dressing C/D/I and no drainage No cellulitis present Compartment soft   Lab Results  Component Value Date   WBC 14.3 (H) 08/23/2017   HGB 7.7 (L) 08/23/2017   HCT 23.7 (L) 08/23/2017   MCV 86.2 08/23/2017   PLT 177 08/23/2017     Assessment/Plan:  3 Days Post-Op   - stable from ortho stand point - f/u 1 week for wound check - NWB - SNF pending  Eduard Roux 08/23/2017, 9:38 AM [phone]

## 2017-08-23 NOTE — Progress Notes (Signed)
At 2200 last night patient was taking bedtime pills and aspirated. Pt was able to cough, VS were WDL. Rapid response was called. MD was notified. Per physician order, keep pt NPO and continue to monitor. Recent O2 levels 100 on 2LNC, HR 75. Resp 16. Pt is resting comfortably; not in resp distress. Will continue to monitor.

## 2017-08-23 NOTE — Progress Notes (Signed)
SLP Cancellation Note  Patient Details Name: KAMREN HESKETT MRN: [id number] DOB: Jan 01, 1916   Cancelled treatment:       Reason Eval/Treat Not Completed: Other (comment). MBS orders received. Per Dr. Ronalee Red, will assess pt at bedside first.   Deneise Lever, [place], [place] Pathologist [phone]   Aliene Altes 08/23/2017, 8:05 AM

## 2017-08-23 NOTE — Progress Notes (Signed)
Skin assessment: Dry, flaky skin. Bruising of bilateral lower and upper extremities. LLE has dry, brown scab-like spots. Family stated that is normal for patient. Ball area of left foot, callus is located, covered by bandage. Take care of by family. They stated they change bandage out when needed. Area assessed.

## 2017-08-23 NOTE — Evaluation (Signed)
Clinical/Bedside Swallow Evaluation Patient Details  Name: Shirley Strickland MRN: [id number] Date of Birth: 1915-12-31  Today's Date: 08/23/2017 Time: SLP Start Time (ACUTE ONLY): 28 SLP Stop Time (ACUTE ONLY): 1030 SLP Time Calculation (min) (ACUTE ONLY): 24 min  Past Medical History:  Past Medical History:  Diagnosis Date  . A-fib ([place])   . 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 Bruise    bruise on left foot from fall 07/03/11  . Cancer [facility])    pt states only skin cancer  . CHF (congestive heart failure) ([place])   . DVT (deep venous thrombosis) ([place]) 12/28/2013   LEFT   . Fall   . GERD (gastroesophageal reflux disease)   . HTN (hypertension)   . Knee pain, acute 07/04/11   Pt states left knee twisted under her in fall 07/03/11 and now painful  . Numbness of legs    started after fall yesterday 07/03/11  . Open ankle fracture    Right  . Osteopenia   . Pneumonia   . Rash and nonspecific skin eruption 07/04/11   rash on bilateral upper legs  . Syncope    Past Surgical History:  Past Surgical History:  Procedure Laterality Date  . APPENDECTOMY    . CATARACT EXTRACTION    . ELBOW SURGERY     right  . EXPLORATORY LAPAROTOMY  07/04/11   years ago to find cause of bleeding  . I&D EXTREMITY Right 08/20/2017   Procedure: IRRIGATION AND DEBRIDEMENT EXTREMITY WITH PIN FIXATION RIGHT ANKLE;  Surgeon: Leandrew Koyanagi, MD;  Location: [place];  Service: Orthopedics;  Laterality: Right;   HPI:  Shirley Strickland is a 83 yo F with a History of Atrial fibrillation, NSTEMI (suspected type II), CHF, HTN, DVT (IVC filter in place), osteoporosis, and hypothyroidism presents with right ankle fracture following a fall, now s/p debridement and ORIF 12/27 with Orthopedic Surgery. Has been NPO after coughing, suspected aspiration with pills yesterday. Swallow evaluation ordered.   Assessment / Plan / Recommendation Clinical Impression  Patient presents with overt signs of aspiration with thin liquids,  including immediate cough, increased work of breathing, multiple swallows with single straw sip. Throat clearing and multiple swallows with single cup sips; suspect delayed swallow initiation. While pt requires multiple swallows for other consistencies including nectar, puree and regular solids, no overt signs of aspiration observed. Question multiple swallows due to xerostomia, NPO status overnight. Pt's niece denies history of dysphagia, states pt was feeding herself prior to admit. She is somewhat lethargic, confused. Recommend initiating dys 3, nectar thick liquids, meds crushed in puree. Will follow up at bedside next date for tolerance, advancement vs MBS.  SLP Visit Diagnosis: Dysphagia, unspecified (R13.10)    Aspiration Risk  Mild aspiration risk    Diet Recommendation Dysphagia 3 (Mech soft);Nectar-thick liquid   Liquid Administration via: Cup;Straw Medication Administration: Crushed with puree Compensations: Slow rate;Small sips/bites;Minimize environmental distractions Postural Changes: Seated upright at 90 degrees    Other  Recommendations Oral Care Recommendations: Oral care BID Other Recommendations: Order thickener from pharmacy;Clarify dietary restrictions   Follow up Recommendations Skilled Nursing facility      Frequency and Duration min 2x/week  1 week       Prognosis Prognosis for Safe Diet Advancement: Good Barriers to Reach Goals: Cognitive deficits      Swallow Study   General Date of Onset: 08/20/17 HPI: Shirley Strickland is a 83 yo F with a History of Atrial fibrillation, NSTEMI (suspected type II),  CHF, HTN, DVT (IVC filter in place), osteoporosis, and hypothyroidism presents with right ankle fracture following a fall, now s/p debridement and ORIF 12/27 with Orthopedic Surgery. Has been NPO after coughing, suspected aspiration with pills yesterday. Swallow evaluation ordered. Type of Study: Bedside Swallow Evaluation Previous Swallow Assessment: none on file Diet  Prior to this Study: NPO Temperature Spikes Noted: Yes(100.1) Respiratory Status: Nasal cannula History of Recent Intubation: No Behavior/Cognition: Lethargic/Drowsy;Requires cueing Oral Cavity Assessment: Dry Oral Care Completed by SLP: Yes Oral Cavity - Dentition: Dentures, top;Dentures, bottom Vision: Functional for self-feeding Self-Feeding Abilities: Total assist Patient Positioning: Upright in bed Baseline Vocal Quality: Hoarse Volitional Cough: Cognitively unable to elicit Volitional Swallow: Unable to elicit    Oral/Motor/Sensory Function Overall Oral Motor/Sensory Function: Generalized oral weakness   Ice Chips Ice chips: Within functional limits   Thin Liquid Thin Liquid: Impaired Presentation: Cup;Straw Pharyngeal  Phase Impairments: Suspected delayed Swallow;Throat Clearing - Immediate;Cough - Immediate;Change in Vital Signs;Multiple swallows    Nectar Thick Nectar Thick Liquid: Impaired Presentation: Cup;Straw;Self Fed Pharyngeal Phase Impairments: Multiple swallows   Honey Thick Honey Thick Liquid: Not tested   Puree Puree: Impaired Presentation: Spoon Pharyngeal Phase Impairments: Multiple swallows   Solid   GO   Solid: Impaired Pharyngeal Phase Impairments: Multiple swallows        Shirley Strickland 08/23/2017,11:09 AM  Deneise Lever, [place], [place] Pathologist [phone]

## 2017-08-24 LAB — CBC
HEMATOCRIT: 23.3 % — AB (ref 36.0–46.0)
HEMOGLOBIN: 7.6 g/dL — AB (ref 12.0–15.0)
MCH: 27.9 pg (ref 26.0–34.0)
MCHC: 32.6 g/dL (ref 30.0–36.0)
MCV: 85.7 fL (ref 78.0–100.0)
Platelets: 174 10*3/uL (ref 150–400)
RBC: 2.72 MIL/uL — ABNORMAL LOW (ref 3.87–5.11)
RDW: 15 % (ref 11.5–15.5)
WBC: 8.5 10*3/uL (ref 4.0–10.5)

## 2017-08-24 LAB — BASIC METABOLIC PANEL
Anion gap: 10 (ref 5–15)
BUN: 44 mg/dL — AB (ref 6–20)
CHLORIDE: 97 mmol/L — AB (ref 101–111)
CO2: 23 mmol/L (ref 22–32)
Calcium: 8.2 mg/dL — ABNORMAL LOW (ref 8.9–10.3)
Creatinine, Ser: 2.92 mg/dL — ABNORMAL HIGH (ref 0.44–1.00)
GFR calc Af Amer: 14 mL/min — ABNORMAL LOW (ref >60)
GFR, EST NON AFRICAN AMERICAN: 12 mL/min — AB (ref >60)
GLUCOSE: 115 mg/dL — AB (ref 65–99)
POTASSIUM: 4.8 mmol/L (ref 3.5–5.1)
Sodium: 130 mmol/L — ABNORMAL LOW (ref 135–145)

## 2017-08-24 MED ORDER — HYDRALAZINE HCL 10 MG PO TABS
10.0000 mg | ORAL_TABLET | Freq: Once | ORAL | Status: AC
  Administered 2017-08-24: 10 mg via ORAL
  Filled 2017-08-24: qty 1

## 2017-08-24 MED ORDER — SODIUM CHLORIDE 0.9 % IV SOLN
INTRAVENOUS | Status: DC
  Administered 2017-08-24: 50 mL/h via INTRAVENOUS

## 2017-08-24 MED ORDER — FENTANYL CITRATE (PF) 100 MCG/2ML IJ SOLN
12.5000 ug | INTRAMUSCULAR | Status: DC | PRN
  Administered 2017-08-24 – 2017-08-26 (×3): 12.5 ug via INTRAVENOUS
  Filled 2017-08-24 (×3): qty 2

## 2017-08-24 NOTE — Progress Notes (Signed)
Subjective:  Niece at bedside. Shirley Strickland was sitting up in bed in this morning. Niece states that she is more alert today and doing well. Patient endorses some pain in her right lower extremity, that is relieved by IV Fentanyl for approximately 2 hours. Denies chest pain or shortness of breath. She denies dysuria. Had a BM this morning.  Objective:  Vital signs in last 24 hours: Vitals:   08/23/17 0528 08/23/17 1508 08/23/17 1926 08/24/17 0454  BP: 129/60 (!) 126/54 (!) 140/51 (!) 148/50  Pulse: 88 87 78 72  Resp: 20 18 16 15   Temp: 98.6 F (37 C) 99 F (37.2 C) 98.1 F (36.7 C) 97.8 F (36.6 C)  TempSrc: Oral Oral Axillary Axillary  SpO2: 97% 99% 99% 92%  Weight:      Height:       GEN: Pleasant elderly female lying in bed in NAD. Alert. Hard of hearing. RESP: Clear to auscultation bilaterally. No wheezes, rales, or rhonchi. No increased work of breathing. CV: Normal rate and regular rhythm. No murmurs, gallops, or rubs. No LE edema. ABD: Soft. Mildly tender to palpation in left abdomen. Non-distended. Normoactive bowel sounds. No rebound or guarding. EXT: RLE in clean and dry cast and dressing. SKIN: Senile purpura on bilateral forearms. BLE skin very tender (chronic). NEURO: Cranial nerves II-XII grossly intact. Able to lift all four extremities against gravity. No apparent audiovisual hallucinations. Speech fluent and appropriate. PSYCH: Patient is calm and pleasant. Appropriate affect. Well-groomed; speech is appropriate and on-subject.  Labs CBC Latest Ref Rng & Units 08/24/2017 08/23/2017 08/21/2017  WBC 4.0 - 10.5 K/uL 8.5 14.3(H) 11.2(H)  Hemoglobin 12.0 - 15.0 g/dL 7.6(L) 7.7(L) 9.4(L)  Hematocrit 36.0 - 46.0 % 23.3(L) 23.7(L) 29.2(L)  Platelets 150 - 400 K/uL 174 177 230   CMP Latest Ref Rng & Units 08/24/2017 08/23/2017 08/22/2017  Glucose 65 - 99 mg/dL 115(H) 112(H) 119(H)  BUN 6 - 20 mg/dL 44(H) 38(H) 27(H)  Creatinine 0.44 - 1.00 mg/dL 2.92(H) 2.61(H) 1.48(H)   Sodium 135 - 145 mmol/L 130(L) 130(L) 132(L)  Potassium 3.5 - 5.1 mmol/L 4.8 5.1 4.7  Chloride 101 - 111 mmol/L 97(L) 100(L) 102  CO2 22 - 32 mmol/L 23 23 24   Calcium 8.9 - 10.3 mg/dL 8.2(L) 8.4(L) 8.8(L)  Total Protein 6.5 - 8.1 g/dL - - -  Total Bilirubin 0.3 - 1.2 mg/dL - - -  Alkaline Phos 38 - 126 U/L - - -  AST 15 - 41 U/L - - -  ALT 14 - 54 U/L - - -   UA with ur prot 100 Ur Na 94, Ur Cr 161.96 FENa 1.3%  Renal U/S 12/30 1. No mass or hydronephrosis. 2. Bilateral renal cortical volume loss.  Assessment/Plan:  Shirley Strickland is a 83 yo F with a History of Atrial fibrillation, NSTEMI (suspected type II), CHF, HTN, DVT (IVC filter in place), osteoporosis, and hypothyroidism presents with right ankle fracture following a fall, now s/p debridement and ORIF 12/27 with Orthopedic Surgery.  Type IIIa open right trimalleolar ankle fracture dislocation after fall, s/p debridement and ORIF without fixation of posterior malleolus 12/27 and reduction of dislocation on 12/27. Given her allergies to other antibiotics, she completed a course of Vancomycin/Ciprofloxacin 12/27-12/30 for open fracture prophylaxis. Afebrile. Hb stable at 7.6 (baseline 8.5-10). Possibly post-op anemia. Will continue to monitor. - Orthopedic Surgery consulted; appreciate their recommendations - Nonweightbearing on RLE for ~6-8 weeks depending on evidence of healing - Will follow up with Ortho  in 1 week for wound check - Fentanyl 12.83mcg IV q2h PRN for severe pain - SW consulted for SNF placement - Will need SNF placement and short-term rehab prior to re-enrolling in hospice - Continue to monitor temperature - Delirium precautions - CBC in AM  AKI Cr 1.48 -> 2.61 -> 2.92, baseline is 0.9-1. Likely secondary to volume depletion, BUN also elevated at 44. Patient denies dysuria or abdominal pain. UA without evidence of UTI. Renal ultrasound is negative for obstruction. Bladder scan with 150cc. FENa 1.3%, suggesting  intrinsic disease. - BMET tomorrow AM - IV NS 50cc/hr x24h - Consider Nephrology consult if no improvement  Pruritus and sensation of throat swelling, resolved after steroids, famotidine, and benadryl Possibly secondary to clindamycin, which has been discontinued. - Continue to monitor  Atrial Fibrillation Rate controlled on metoprolol. CHADS2-VASc score of at least 6, with 13.6% annual risk of CVA. Patient not currently on anticoagulation secondary to prior GI bleeds with anticoagulation that required hospitalization and pRBC transfusions. - Continue home Metoprolol 12.5mg  BID - Low dose lovenox for DVT prophylaxis  Hypothyroidism - Continue home Synthroid 58mcg Daily  VTE PPx: low dose Lovenox Dispo: Anticipated discharge in approximately 1-2 days. - SW consulted; patient has been accepted at [facility], but has AKI so will require further monitoring in hospital  Colbert Ewing, MD 08/24/2017, 7:27 AM Pager: Mamie Nick [phone]

## 2017-08-24 NOTE — Progress Notes (Signed)
  Speech Language Pathology Treatment: Dysphagia  Patient Details Name: Shirley Strickland MRN: [id number] DOB: [birth year] Today's Date: 08/24/2017 Time: 6578-4696 SLP Time Calculation (min) (ACUTE ONLY): 19 min  Assessment / Plan / Recommendation Clinical Impression  Pt consumed thin liquids with no overt signs of aspiration or dysphagia as noted during initial evaluation. Her niece believes that her speech and mentation are clearer today, and much closer to baseline. Recommend advancing back to her baseline diet of regular textures and thin liquids with continued SLP f/u to assess for tolerance. Her niece asked about throwing out the can of thickener, but I encouraged her to keep it in case the pt has any coughing with thin liquids should she have any other fluctuations in her mentation.   HPI HPI: Shirley Strickland is a 83 yo F with a History of Atrial fibrillation, NSTEMI (suspected type II), CHF, HTN, DVT (IVC filter in place), osteoporosis, and hypothyroidism presents with right ankle fracture following a fall, now s/p debridement and ORIF 12/27 with Orthopedic Surgery. Has been NPO after coughing, suspected aspiration with pills yesterday. Swallow evaluation ordered.      SLP Plan  Continue with current plan of care       Recommendations  Diet recommendations: Regular;Thin liquid Liquids provided via: Cup;Straw Medication Administration: Whole meds with puree Supervision: Patient able to self feed;Full supervision/cueing for compensatory strategies Compensations: Slow rate;Small sips/bites;Minimize environmental distractions Postural Changes and/or Swallow Maneuvers: Seated upright 90 degrees                Oral Care Recommendations: Oral care BID Follow up Recommendations: Skilled Nursing facility SLP Visit Diagnosis: Dysphagia, unspecified (R13.10) Plan: Continue with current plan of care       GO                Shirley Strickland 08/24/2017, 12:40 PM  Shirley Strickland,  M.A. CCC-SLP [phone]

## 2017-08-24 NOTE — Progress Notes (Signed)
Patient c/o of that she was having problem breathing O2 stats 93% on room air placed on 2L O2  99%. Arthor Captain LPN

## 2017-08-24 NOTE — Progress Notes (Signed)
MD on call notified that patient BP 180/60 manually taken and this is post lopressor 12.5mg  given patient is asymptomatic no pain. Arthor Captain LPN

## 2017-08-24 NOTE — Progress Notes (Signed)
Medicine attending: Clinical status and database reviewed with resident physician Dr. Colbert Ewing and I concur with her evaluation and management plan which we discussed together. The patient remains clinically stable. Unfortunately, renal function has continued to deteriorate.  Likely combination of volume depletion and transient hypotensive episode.  We will need to monitor her in hospital until renal function plateaus and hopefully shows signs of recovery. Currently no sequelae obvious more than 24 hours post aspiration of a Senokot tablet.  Respiratory status remains stable.

## 2017-08-24 NOTE — Care Management Important Message (Signed)
Important Message  Patient Details  Name: Shirley Strickland MRN: [id number] Date of Birth: February 01, 1916   Medicare Important Message Given:  Yes    [name] 08/24/2017, 2:03 PM

## 2017-08-24 NOTE — Progress Notes (Signed)
Physical Therapy Treatment Patient Details Name: Shirley Strickland MRN: [id number] DOB: Mar 21, 1916 Today's Date: 08/24/2017    History of Present Illness Pt is a 83 y/o female who presents s/p fall at home while attempting to transfer to her wheelchair. She was brought to the ED and found to have an open trimalleolar ankle fracture dislocation s/p ORIF on 08/20/17. PMH significant for DVT, CHF, CA, significantly HOH (talk into R ear).    PT Comments    Pt progressing towards physical therapy goals. Was able to perform transfers with +2 max assist for balance support and safety. Pt very painful throughout session - per RN pain meds d/c'd. Pt was positioned in the chair with the lift pad under linen to assist with return to bed with staff. Touched base with RN at end of session to discuss plan for use of Maxi-Sky for transfers. Will continue to follow and progress as able per POC.   Follow Up Recommendations  SNF;Supervision/Assistance - 24 hour     Equipment Recommendations  None recommended by PT    Recommendations for Other Services       Precautions / Restrictions Precautions Precautions: Fall Precaution Comments: Lower legs very sensitive to touch - used sheets wrapped under ankles to pick up and move LE's during session.  Restrictions Weight Bearing Restrictions: Yes RLE Weight Bearing: Non weight bearing    Mobility  Bed Mobility Overal bed mobility: Needs Assistance Bed Mobility: Supine to Sit     Supine to sit: Total assist;+2 for physical assistance;HOB elevated     General bed mobility comments: Sheets looped under ankles to assist in lifting, and bed pad used to helicopter transfer pt around to EOB. HOB elevated for assist, and max assist provided at the trunk for elevation to full sitting position.   Transfers Overall transfer level: Needs assistance Equipment used: 2 person hand held assist Transfers: Sit to/from [facility] Sit to Stand: Max  assist;+2 physical assistance;From elevated surface Stand pivot transfers: Max assist;+2 physical assistance;From elevated surface       General transfer comment: VC's for sequencing and general safety. Pt was able to power-up to full standing position with +2 assist, and bed pad cradled under hips for added support as pt pivoted around to the chair.   Ambulation/Gait             General Gait Details: Not tested - pt only transfers at home, no ambulation.    Stairs            Wheelchair Mobility    Modified Rankin (Stroke Patients Only)       Balance Overall balance assessment: Needs assistance Sitting-balance support: Feet supported;No upper extremity supported Sitting balance-Leahy Scale: Fair     Standing balance support: No upper extremity supported;During functional activity Standing balance-Leahy Scale: Zero Standing balance comment: +2 assist required.                             Cognition Arousal/Alertness: Awake/alert Behavior During Therapy: WFL for tasks assessed/performed Overall Cognitive Status: Within Functional Limits for tasks assessed                                        Exercises      General Comments General comments (skin integrity, edema, etc.): pt reports bruising easily and needing care for skin with  all contact from staff      Pertinent Vitals/Pain Pain Assessment: Faces Faces Pain Scale: Hurts whole lot Pain Location: Bilateral lower legs Pain Descriptors / Indicators: Operative site guarding;Grimacing;Moaning Pain Intervention(s): Limited activity within patient's tolerance;Monitored during session;Repositioned    Home Living                      Prior Function            PT Goals (current goals can now be found in the care plan section) Acute Rehab PT Goals Patient Stated Goal: Get better, back to being able to transfer to her wheelchair PT Goal Formulation: With  patient/family Time For Goal Achievement: 09/04/17 Potential to Achieve Goals: Good Progress towards PT goals: Progressing toward goals    Frequency    Min 3X/week      PT Plan      Co-evaluation              AM-PAC PT "6 Clicks" Daily Activity  Outcome Measure  Difficulty turning over in bed (including adjusting bedclothes, sheets and blankets)?: Unable Difficulty moving from lying on back to sitting on the side of the bed? : Unable Difficulty sitting down on and standing up from a chair with arms (e.g., wheelchair, bedside commode, etc,.)?: Unable Help needed moving to and from a bed to chair (including a wheelchair)?: Total Help needed walking in hospital room?: Total Help needed climbing 3-5 steps with a railing? : Total 6 Click Score: 6    End of Session Equipment Utilized During Treatment: Gait belt;Oxygen Activity Tolerance: Patient tolerated treatment well Patient left: in chair;with call bell/phone within reach;with nursing/sitter in room;with family/visitor present Nurse Communication: Mobility status;Need for lift equipment PT Visit Diagnosis: History of falling (Z91.81);Pain;Other abnormalities of gait and mobility (R26.89) Pain - Right/Left: Right Pain - part of body: Ankle and joints of foot     Time: 1315-1345 PT Time Calculation (min) (ACUTE ONLY): 30 min  Charges:  $Gait Training: 8-22 mins                    G Codes:       [name], PT, DPT Acute Rehabilitation Services Pager: [phone]    [name] 08/24/2017, 2:01 PM

## 2017-08-25 ENCOUNTER — Inpatient Hospital Stay (HOSPITAL_COMMUNITY): Payer: Medicare Other

## 2017-08-25 DIAGNOSIS — F1099 Alcohol use, unspecified with unspecified alcohol-induced disorder: Secondary | ICD-10-CM

## 2017-08-25 DIAGNOSIS — J9811 Atelectasis: Secondary | ICD-10-CM

## 2017-08-25 LAB — BASIC METABOLIC PANEL
ANION GAP: 8 (ref 5–15)
BUN: 44 mg/dL — ABNORMAL HIGH (ref 6–20)
CO2: 24 mmol/L (ref 22–32)
CREATININE: 2.8 mg/dL — AB (ref 0.44–1.00)
Calcium: 8.6 mg/dL — ABNORMAL LOW (ref 8.9–10.3)
Chloride: 102 mmol/L (ref 101–111)
GFR calc non Af Amer: 13 mL/min — ABNORMAL LOW (ref >60)
GFR, EST AFRICAN AMERICAN: 15 mL/min — AB (ref >60)
Glucose, Bld: 118 mg/dL — ABNORMAL HIGH (ref 65–99)
POTASSIUM: 5 mmol/L (ref 3.5–5.1)
SODIUM: 134 mmol/L — AB (ref 135–145)

## 2017-08-25 MED ORDER — SPIRITUS FRUMENTI
1.0000 | Freq: Every day | ORAL | Status: DC
  Administered 2017-08-25 – 2017-08-28 (×3): 1 via ORAL
  Filled 2017-08-25 (×5): qty 1

## 2017-08-25 MED ORDER — LABETALOL HCL 100 MG PO TABS
50.0000 mg | ORAL_TABLET | Freq: Once | ORAL | Status: AC
  Administered 2017-08-25: 50 mg via ORAL
  Filled 2017-08-25: qty 0.5

## 2017-08-25 NOTE — Plan of Care (Signed)
  Progressing Education: Knowledge of General Education information will improve 08/25/2017 1144 - Progressing by Rance Muir, RN Health Behavior/Discharge Planning: Ability to manage health-related needs will improve 08/25/2017 1144 - Progressing by Rance Muir, RN Clinical Measurements: Ability to maintain clinical measurements within normal limits will improve 08/25/2017 1144 - Progressing by Rance Muir, RN Will remain free from infection 08/25/2017 1144 - Progressing by Rance Muir, RN Diagnostic test results will improve 08/25/2017 1144 - Progressing by Rance Muir, RN Respiratory complications will improve 08/25/2017 1144 - Progressing by Rance Muir, RN Cardiovascular complication will be avoided 08/25/2017 1144 - Progressing by Rance Muir, RN Activity: Risk for activity intolerance will decrease 08/25/2017 1144 - Progressing by Rance Muir, RN Nutrition: Adequate nutrition will be maintained 08/25/2017 1144 - Progressing by Rance Muir, RN Coping: Level of anxiety will decrease 08/25/2017 1144 - Progressing by Rance Muir, RN Elimination: Will not experience complications related to bowel motility 08/25/2017 1144 - Progressing by Rance Muir, RN Will not experience complications related to urinary retention 08/25/2017 1144 - Progressing by Rance Muir, RN Pain Managment: General experience of comfort will improve 08/25/2017 1144 - Progressing by Rance Muir, RN Safety: Ability to remain free from injury will improve 08/25/2017 1144 - Progressing by Rance Muir, RN Skin Integrity: Risk for impaired skin integrity will decrease 08/25/2017 1144 - Progressing by Rance Muir, RN

## 2017-08-25 NOTE — Progress Notes (Signed)
Medicine attending: I examined this patient today together with resident physician Dr. Colbert Ewing and I concur with her evaluation and management plan which we discussed together. Patient feels her pain is adequately controlled at present.  Transient dyspnea during the night resolved on oxygen.  Minimal left basilar rales on lung exam today and she is in no respiratory distress.  Oxygen saturation 100%. Trend for improvement in her renal function with creatinine down to from 2.9 to 2.8.  I would like to get at least one more point on the curve to make sure renal function is stable prior to transfer to a extended care facility.   Patient's niece and main caregiver here today.  She is a retired Therapist, sports.  Status and management plan reviewed with her.

## 2017-08-25 NOTE — Progress Notes (Signed)
Subjective:  Niece at bedside. Ms. Noell sitting in bed this morning. She states that she had some trouble breathing last night with O2 sat at 93%, so she was placed on 2L [place]. CXR with atelectasis in left lung base and cardiac enlargement. IVF discontinued. Patient endorses mild pain in her right lower extremity. Denies chest pain or shortness of breath. She denies dysuria but feels that she does need to urinate. The patient states she drinks a beer every day at 3pm. The niece states that this is the first time she has asked, and that she takes it as a sign that she is feeling better.  Objective:  Vital signs in last 24 hours: Vitals:   08/24/17 2210 08/24/17 2359 08/25/17 0345 08/25/17 0358  BP: (!) 180/60 (!) 190/60 (!) 151/61   Pulse:   74   Resp:   20   Temp:   97.7 F (36.5 C)   TempSrc:   Oral   SpO2:   100%   Weight:    150 lb 9.2 oz (68.3 kg)  Height:       GEN: Pleasant elderly female lying in bed in NAD. Alert. Hard of hearing. RESP: Clear to auscultation bilaterally. Faint crackles in left lower lobe. No increased work of breathing. CV: Normal rate and regular rhythm. No murmurs, gallops, or rubs. No LE edema. ABD: Soft. Mildly tender to palpation in left abdomen. Non-distended. Normoactive bowel sounds. No rebound or guarding. EXT: RLE in clean and dry cast and dressing. SKIN: Senile purpura on bilateral forearms. BLE skin very tender (chronic). NEURO: Cranial nerves II-XII grossly intact. Able to lift all four extremities against gravity. No apparent audiovisual hallucinations. Speech fluent and appropriate. PSYCH: Patient is calm and pleasant. Appropriate affect. Well-groomed; speech is appropriate and on-subject.  Labs CBC Latest Ref Rng & Units 08/24/2017 08/23/2017 08/21/2017  WBC 4.0 - 10.5 K/uL 8.5 14.3(H) 11.2(H)  Hemoglobin 12.0 - 15.0 g/dL 7.6(L) 7.7(L) 9.4(L)  Hematocrit 36.0 - 46.0 % 23.3(L) 23.7(L) 29.2(L)  Platelets 150 - 400 K/uL 174 177 230   CMP  Latest Ref Rng & Units 08/25/2017 08/24/2017 08/23/2017  Glucose 65 - 99 mg/dL 118(H) 115(H) 112(H)  BUN 6 - 20 mg/dL 44(H) 44(H) 38(H)  Creatinine 0.44 - 1.00 mg/dL 2.80(H) 2.92(H) 2.61(H)  Sodium 135 - 145 mmol/L 134(L) 130(L) 130(L)  Potassium 3.5 - 5.1 mmol/L 5.0 4.8 5.1  Chloride 101 - 111 mmol/L 102 97(L) 100(L)  CO2 22 - 32 mmol/L 24 23 23   Calcium 8.9 - 10.3 mg/dL 8.6(L) 8.2(L) 8.4(L)  Total Protein 6.5 - 8.1 g/dL - - -  Total Bilirubin 0.3 - 1.2 mg/dL - - -  Alkaline Phos 38 - 126 U/L - - -  AST 15 - 41 U/L - - -  ALT 14 - 54 U/L - - -   Renal U/S 12/30 1. No mass or hydronephrosis. 2. Bilateral renal cortical volume loss.  Assessment/Plan:  Ms. Weider is a 84 yo F with a History of Atrial fibrillation, NSTEMI (suspected type II), CHF, HTN, DVT (IVC filter in place), osteoporosis, and hypothyroidism presents with right ankle fracture following a fall, now s/p debridement and ORIF 12/27 with Orthopedic Surgery.  Type IIIa open right trimalleolar ankle fracture dislocation after fall, s/p debridement and ORIF without fixation of posterior malleolus 12/27 and reduction of dislocation on 12/27. Given her allergies to other antibiotics, she completed a course of Vancomycin/Ciprofloxacin 12/27-12/30 for open fracture prophylaxis. Afebrile. - Orthopedic Surgery consulted; appreciate their  recommendations - Nonweightbearing on RLE for ~6-8 weeks depending on evidence of healing - Will follow up with Ortho in 1 week for wound check - Fentanyl 12.13mcg IV q2h PRN for severe pain - SW consulted for SNF placement - Will need SNF placement and short-term rehab prior to re-enrolling in hospice - Continue to monitor temperature - Delirium precautions - CBC in AM  AKI Cr 1.48 -> 2.61 -> 2.92 -> 2.80, baseline is 0.9-1. Slightly improved today. Likely secondary to volume depletion and transient hypotension a few days ago, BUN also elevated at 44. IVF was discontinued last night due to  shortness of breath. Patient denies dysuria or abdominal pain. UA without evidence of UTI. Renal ultrasound is negative for obstruction. Bladder scan with 150cc. Will continue to monitor creatinine, and if it continues to improve tomorrow, then she can likely be discharged to SNF. - BMET in AM - Can consider Nephrology consult if Cr worsens  Atrial Fibrillation Rate controlled on metoprolol. CHADS2-VASc score of at least 6, with 13.6% annual risk of CVA. Patient not currently on anticoagulation secondary to prior GI bleeds with anticoagulation that required hospitalization and pRBC transfusions. - Continue home Metoprolol 12.5mg  BID - Low dose lovenox for DVT prophylaxis  Hypothyroidism - Continue home Synthroid 68mcg Daily  Hx of HTN BP elevated overnight to 190/60. Received a dose of hydralazine 10mg  PO. She was previously on amlodipine 5mg  daily and telmisartan 80mg  daily, which were discontinued at a prior hospitalization secondary to normal-low BP. If she continues to have elevated BP, she can restart amlodipine, but will hold off on the telmisartan given her AKI. - Continue to monitor - If BP remaining elevated, can restart home amlodipine 5mg  daily  Daily alcohol intake Drinks a beer every day at 3pm. Requests Bud Light. - Spiritus Frumenti once daily at 3pm  Pruritus and sensation of throat swelling, resolved after steroids, famotidine, and benadryl Possibly secondary to clindamycin, which has been discontinued. - Continue to monitor  VTE PPx: low dose Lovenox Dispo: Anticipated discharge in approximately 1-2 days. - SW consulted; patient has been accepted at [facility], but has AKI so will require further monitoring in hospital. If her creatinine continues to improve tomorrow, she can be discharged to [facility]  Colbert Ewing, MD 08/25/2017, 10:17 AM Pager: Mamie Nick [phone]

## 2017-08-25 NOTE — Progress Notes (Signed)
MD updated on patient bp 190/60 manually. Informed that patient c/o SOB 93%RA placed 2l O2 stats improved 99%. Patient output 124ml so far this shift and that patient has edema. No pain from rt ankle fracture. Will continue to monitor. Arthor Captain LPN

## 2017-08-26 ENCOUNTER — Other Ambulatory Visit: Payer: Self-pay

## 2017-08-26 DIAGNOSIS — T50905A Adverse effect of unspecified drugs, medicaments and biological substances, initial encounter: Secondary | ICD-10-CM

## 2017-08-26 DIAGNOSIS — D631 Anemia in chronic kidney disease: Secondary | ICD-10-CM

## 2017-08-26 DIAGNOSIS — N184 Chronic kidney disease, stage 4 (severe): Secondary | ICD-10-CM

## 2017-08-26 DIAGNOSIS — I959 Hypotension, unspecified: Secondary | ICD-10-CM

## 2017-08-26 LAB — HEMOGLOBIN AND HEMATOCRIT, BLOOD
HCT: 29.4 % — ABNORMAL LOW (ref 36.0–46.0)
Hemoglobin: 9.6 g/dL — ABNORMAL LOW (ref 12.0–15.0)

## 2017-08-26 LAB — CBC
HCT: 22.4 % — ABNORMAL LOW (ref 36.0–46.0)
Hemoglobin: 7.3 g/dL — ABNORMAL LOW (ref 12.0–15.0)
MCH: 27.8 pg (ref 26.0–34.0)
MCHC: 32.6 g/dL (ref 30.0–36.0)
MCV: 85.2 fL (ref 78.0–100.0)
PLATELETS: 224 10*3/uL (ref 150–400)
RBC: 2.63 MIL/uL — ABNORMAL LOW (ref 3.87–5.11)
RDW: 15 % (ref 11.5–15.5)
WBC: 8.4 10*3/uL (ref 4.0–10.5)

## 2017-08-26 LAB — BASIC METABOLIC PANEL
Anion gap: 6 (ref 5–15)
BUN: 44 mg/dL — AB (ref 6–20)
CHLORIDE: 102 mmol/L (ref 101–111)
CO2: 25 mmol/L (ref 22–32)
CREATININE: 2.58 mg/dL — AB (ref 0.44–1.00)
Calcium: 8.7 mg/dL — ABNORMAL LOW (ref 8.9–10.3)
GFR calc Af Amer: 17 mL/min — ABNORMAL LOW (ref >60)
GFR calc non Af Amer: 14 mL/min — ABNORMAL LOW (ref >60)
GLUCOSE: 108 mg/dL — AB (ref 65–99)
Potassium: 4.8 mmol/L (ref 3.5–5.1)
SODIUM: 133 mmol/L — AB (ref 135–145)

## 2017-08-26 LAB — PREPARE RBC (CROSSMATCH)

## 2017-08-26 MED ORDER — ACETAMINOPHEN 325 MG PO TABS: 650.0000 mg | ORAL_TABLET | Freq: Four times a day (QID) | ORAL | 0 refills | Status: AC | PRN

## 2017-08-26 MED ORDER — ACETAMINOPHEN 325 MG PO TABS
650.0000 mg | ORAL_TABLET | Freq: Four times a day (QID) | ORAL | Status: DC | PRN
  Administered 2017-08-26 – 2017-08-29 (×6): 650 mg via ORAL
  Filled 2017-08-26 (×7): qty 2

## 2017-08-26 MED ORDER — AMLODIPINE BESYLATE 5 MG PO TABS: 5.0000 mg | ORAL_TABLET | Freq: Every day | ORAL | 0 refills | Status: AC

## 2017-08-26 MED ORDER — AMLODIPINE BESYLATE 5 MG PO TABS
5.0000 mg | ORAL_TABLET | Freq: Every day | ORAL | Status: DC
  Administered 2017-08-26 – 2017-08-27 (×2): 5 mg via ORAL
  Filled 2017-08-26 (×2): qty 1

## 2017-08-26 MED ORDER — SODIUM CHLORIDE 0.9 % IV SOLN: Freq: Once | INTRAVENOUS | Status: DC

## 2017-08-26 MED ORDER — SPIRITUS FRUMENTI: 1.0000 | Freq: Every day | ORAL | 12 refills | Status: AC

## 2017-08-26 NOTE — Progress Notes (Signed)
Medicine attending: Upon chart review after daily rounds, it was noted that the patient's hemoglobin had fallen from 9.4 on postop day 1 to 7.3 today likely combined effects of surgery, baseline chronic anemia, and acute renal injury.  In view of her advanced age, prophylactic transfusion felt appropriate.  Discharge orders had already been signed and patient was en route to a local rehab facility.  Social worker was able to contact the facility and the patient was returned to the hospital. She is alert, awake, oriented. Pulse 72 regular, blood pressure 178/65 Lungs are clear and resonant to percussion Regular cardiac rhythm Impression: 1.  Progressive fall in hemoglobin Plan: 1 unit packed red blood cell transfusion Patient's niece and retired Therapist, sports is present.  I apologized to the patient and the niece for this oversight and the inconvenience of her having to return to the hospital for transfusion. 2.  Rebound hypertension after episode of clinically significant hypotension. We will resume outpatient amlodipine 5 mg at this time.

## 2017-08-26 NOTE — Social Work (Signed)
Late Entry:  3:17pm CSW received call from Dr. Berneice Gandy indicating that this discharged patient needs to be transfused. CSW discussed with clinical team and CW immediately contacted [facility] (transport) at 3:19pm to bring patient back to hospital for medical needs. Pt returned to Room 5N17.  3:24pm CSW then made numerous attempts to contact family and was unsuccessful.  CSW eventually left a message on voicemail of niece debbie requesting she call ortho floor.  3:25pm CSW then contacted [facility],who advised that patient's family member's are on-site and she would make them aware of medical need and return to hospital.  CSW advised SNF to have family call Ortho floor to discuss with RN.  CSW then discussed with Nursing Director and Surveyor, quantity the situation.  Elissa Hefty, LCSW Clinical Social Worker [phone]

## 2017-08-26 NOTE — Progress Notes (Signed)
Pt brought back to hospital to admin 1 unit of blood. Hgb 7.3. Spoke with niece and she is aware of what is going on.

## 2017-08-26 NOTE — Progress Notes (Signed)
Occupational Therapy Treatment Patient Details Name: Shirley Strickland MRN: [id number] DOB: [birth year] Today's Date: 08/26/2017    History of present illness Pt is a 84 y/o female who presents s/p fall at home while attempting to transfer to her wheelchair. She was brought to the ED and found to have an open trimalleolar ankle fracture dislocation s/p ORIF on 08/20/17. PMH significant for DVT, CHF, CA, significantly HOH (talk into R ear).   OT comments  Pt with elevated SBP (195/67) with symptoms. Ot to continue to follow and reattempt when more appropriate. Pt reports feeling pain bil LE today.    Follow Up Recommendations  SNF    Equipment Recommendations  3 in 1 bedside commode;Wheelchair (measurements OT);Wheelchair cushion (measurements OT);Hospital bed    Recommendations for Other Services      Precautions / Restrictions Precautions Precautions: Fall Precaution Comments: Lower legs very sensitive to touch - used sheets wrapped under ankles to pick up and move LE's during session.  Restrictions Weight Bearing Restrictions: Yes RLE Weight Bearing: Non weight bearing       Mobility Bed Mobility Overal bed mobility: Needs Assistance Bed Mobility: Rolling Rolling: Mod assist         General bed mobility comments: requires total (A) of sheet as leg lifter to help roll . pt rolling to the L better than R this session  Transfers                 General transfer comment: transfer attempt terminated due to elevated BP and symptomatic     Balance                                           ADL either performed or assessed with clinical judgement   ADL Overall ADL's : Needs assistance/impaired Eating/Feeding: Set up           Lower Body Bathing: +2 for physical assistance;Maximal assistance Lower Body Bathing Details (indicate cue type and reason): pt log rolling left for peri care. pt reports soreness from multiple attempts at hygiene. no break  down at this time                       General ADL Comments: pt provided pain medication via iv at start of session. pt then with c/o "i feel like i am going to pass out" and reports "SOB"  Vitals obtained and noted to have elevated SBP and oxygen 97 RA at this time. Session terminated for transfer with medication provided for BP at this time.      Vision       Perception     Praxis      Cognition Arousal/Alertness: Awake/alert Behavior During Therapy: WFL for tasks assessed/performed Overall Cognitive Status: Within Functional Limits for tasks assessed                                          Exercises     Shoulder Instructions       General Comments      Pertinent Vitals/ Pain       Pain Assessment: Faces Faces Pain Scale: Hurts whole lot Pain Location: Bilateral lower legs Pain Descriptors / Indicators: Operative site guarding;Grimacing;Moaning Pain Intervention(s): Monitored during session;Premedicated before session;Repositioned;Patient requesting pain  meds-RN notified  Home Living                                          Prior Functioning/Environment              Frequency  Min 2X/week        Progress Toward Goals  OT Goals(current goals can now be found in the care plan section)  Progress towards OT goals: Progressing toward goals  Acute Rehab OT Goals Patient Stated Goal: Get better, back to being able to transfer to her wheelchair OT Goal Formulation: With patient/family Time For Goal Achievement: 09/04/17 Potential to Achieve Goals: Good ADL Goals Pt Will Perform Grooming: with supervision;sitting Pt Will Perform Upper Body Bathing: with supervision;sitting Pt Will Transfer to Toilet: stand pivot transfer;with max assist;bedside commode Additional ADL Goal #1: pt will complete bed mobilty mod (A) as precursor to adls.  Plan Discharge plan remains appropriate    Co-evaluation    PT/OT/SLP  Co-Evaluation/Treatment: Yes Reason for Co-Treatment: Complexity of the patient's impairments (multi-system involvement);For patient/therapist safety;To address functional/ADL transfers   OT goals addressed during session: ADL's and self-care;Proper use of Adaptive equipment and DME;Strengthening/ROM      AM-PAC PT "6 Clicks" Daily Activity     Outcome Measure   Help from another person eating meals?: None Help from another person taking care of personal grooming?: A Little Help from another person toileting, which includes using toliet, bedpan, or urinal?: A Lot Help from another person bathing (including washing, rinsing, drying)?: A Lot Help from another person to put on and taking off regular upper body clothing?: A Lot Help from another person to put on and taking off regular lower body clothing?: A Lot 6 Click Score: 15    End of Session    OT Visit Diagnosis: Unsteadiness on feet (R26.81);Muscle weakness (generalized) (M62.81)   Activity Tolerance Patient tolerated treatment well   Patient Left in bed;with call bell/phone within reach;with nursing/sitter in room;with family/visitor present   Nurse Communication Mobility status;Precautions        Time: 1034(1034)-1055 OT Time Calculation (min): 21 min  Charges: OT General Charges $OT Visit: 1 Visit OT Treatments $Self Care/Home Management : 8-22 mins   Jeri Modena   OTR/L Pager: [phone] Office: [phone] .    Parke Poisson B 08/26/2017, 12:02 PM

## 2017-08-26 NOTE — Progress Notes (Signed)
Since discharge @ 3:00 pm, was made aware that patient's Hgb from this AM 7.3. Per chart review patient's Hgb 7.6 on 08/24/17. Hgb stable but low since admission, decided to transfuse 1 unit patient prior to d/c to SNF. Called to floor with order for transfusion and patient already left facility. Called social work for assistance and patient able to return to original room/floor for transfusion.  Went to evaluate patient upon return to hospital. Resting comfortably in bed with niece bedside. Patient upset about return to hospital, but no other acute complaints.  HR: 72, BP: 178/58 Gen: Resting comfortably in bed, no acute distress Pulm: Lungs clear to auscultation bilaterally, no crackles appreciated CV: RRR, no murmurs  Ordered patient 1 pRBCs to be transfused.  Since patient's BP elevated, decided to give 1 dose of amlodipine during hospital stay. Will be D/Ced with this medicine given HTN.   Patient and niece unsure if they will make it back to facility this evening. Patient can be discharged after transfusion, however if patient want's to stay she can stay tonight and will D/C in AM.

## 2017-08-26 NOTE — Progress Notes (Signed)
[facility] called and informed that pt would not be coming back tonight but tomorrow.

## 2017-08-26 NOTE — Progress Notes (Signed)
   Subjective:  Patient observed laying comfortably in bed this AM in no acute distress without any acute complaints. States that she would like to go to skilled nursing facility today. Had difficulty working with PT earlier this AM, but states that she feels OK now.   Objective: Vital signs in last 24 hours: Vitals:   08/26/17 0200 08/26/17 0300 08/26/17 0400 08/26/17 0500  BP:    (!) 166/47  Pulse:    61  Resp: 16   15  Temp:    98.8 F (37.1 C)  TempSrc:    Oral  SpO2: 100% 100% 100% 99%  Weight:      Height:       Physical Exam  Constitutional: She is oriented to person, place, and time.  Elderly woman laying comfortably in bed in no acute distress  Cardiovascular: Normal rate, regular rhythm and intact distal pulses. Exam reveals no gallop and no friction rub.  No murmur heard. Respiratory: Effort normal and breath sounds normal. No respiratory distress. She has no wheezes.  GI: Soft. She exhibits no distension. There is no tenderness.  Musculoskeletal:  Patient's lower extremities very tender to the touch bilaterally. RLE with clean, dry bandage in place. NO visible pitting edema, but exam limited 2/2 patient pain and cooperation.   Neurological: She is alert and oriented to person, place, and time.  Gross movement of all 4 extremities intact.  Skin: Skin is warm and dry. No rash noted. No erythema.   Assessment/Plan:  Shirley Strickland is a 84 yo F with a History of Atrial fibrillation, NSTEMI (suspected type II), CHF, HTN, DVT (IVC filter in place), osteoporosis, and hypothyroidism presents with right ankle fracture following a fall, now s/p debridement and ORIF 12/27 with Orthopedic Surgery.  Type IIIa open right trimalleolar ankle fracture dislocation after fall, s/p debridement and ORIF without fixation of posterior malleolus 12/27 and reduction of dislocation on 12/27. -Orthopedic Surgery consulted -Nonweightbearing on RLE for ~6-8 weeks depending on evidence of healing -Will  follow up with Ortho in 1 week for wound check -SW consulted for SNF placement -Delirium precautions  AKI Cr 1.48 -> 2.61 -> 2.92 -> 2.80, baseline is 0.9-1. Continuing to improve today (2.58) and expect continued improvement towards baseline given how her Cr has been while inpatient.   Atrial Fibrillation: Rate controlled on metoprolol. CHADS2-VASc score of at least 6, with 13.6% annual risk of CVA. Patient not currently on anticoagulation secondary to prior GI bleeds with anticoagulation that required hospitalization and pRBC transfusions. - Continue home Metoprolol 12.5mg  BID - Low dose lovenox for DVT prophylaxis  Hypothyroidism - Continue home Synthroid 52mcg Daily  Hx of HTN: BP elevated intermittently throughout hospitalization. Suspected acute elevated BP 2/2 AKI. Improvement as AKI resolving.  -Continue to monitor  -Plan to restart home amlodipine 5mg  on discharge  Daily alcohol intake Drinks a beer every day at 3pm. Requests Bud Light. - Spiritus Frumenti once daily at 3pm  VTE PPx: low dose Lovenox Dispo: SNF today  Thomasene Ripple, MD 08/26/2017, 7:52 AM Pager: Mamie Nick [phone]

## 2017-08-26 NOTE — Discharge Summary (Signed)
Name: Shirley Strickland MRN: [id number] DOB: Jan 08, 1916 84 y.o. PCP: Alvester Chou, NP  Date of Admission: 08/20/2017 10:32 AM Date of Discharge: 08/29/2016 Attending Physician: Annia Belt, MD  Discharge Diagnosis: 1. Type IIIA open trimalleolar fracture of right lower leg with dislocation 2. AKI  Discharge Medications: Allergies as of 08/29/2017      Reactions   Rocephin [ceftriaxone Sodium In Dextrose]    Unable to breath   Clarithromycin Nausea Only   Cod [fish Allergy] Other (See Comments)   Stomach pains and passed out recently - doesn't remember reaction before this weekend   Codeine Nausea And Vomiting   Darvocet [propoxyphene N-acetaminophen] Nausea And Vomiting   Demadex [torsemide] Hives, Nausea And Vomiting   UNKNOWN   Diovan Hct [valsartan-hydrochlorothiazide] Other (See Comments)   Nausea, sick, hives  06-07-17 PATIENT IS CURRENTLY ON THIS MEDICATION, NO SIDE EFFECTS   Ex-lax [senna] Other (See Comments)   unknown   Ibuprofen Nausea Only   Indanediones Other (See Comments)   unknown   Indapamide Other (See Comments)   unknown   Morphine And Related Nausea And Vomiting   Sulfa Antibiotics Other (See Comments)   Couldn't breathe pt states that she was in a coma foe 3 weeks   Benzonatate Itching, Rash   Eyes swelled   Penicillins Rash   Septra [bactrim] Rash      Medication List    STOP taking these medications   metoprolol tartrate 25 MG tablet Commonly known as:  LOPRESSOR     TAKE these medications   acetaminophen 325 MG tablet Commonly known as:  TYLENOL Take 2 tablets (650 mg total) by mouth every 6 (six) hours as needed for mild pain or moderate pain.   ALPRAZolam 0.5 MG tablet Commonly known as:  XANAX Take 0.5 mg by mouth every 8 (eight) hours as needed for anxiety.   amLODipine 5 MG tablet Commonly known as:  NORVASC Take 1 tablet (5 mg total) by mouth daily.   amLODipine 10 MG tablet Commonly known as:  NORVASC Take 1 tablet (10  mg total) by mouth daily. Start taking on:  08/30/2017   aspirin EC 81 MG tablet Take 81 mg by mouth daily.   carvedilol 6.25 MG tablet Commonly known as:  COREG Take 1 tablet (6.25 mg total) by mouth 2 (two) times daily with a meal.   folic acid 1 MG tablet Commonly known as:  FOLVITE Take 1 mg by mouth daily.   levothyroxine 88 MCG tablet Commonly known as:  SYNTHROID, LEVOTHROID Take 44 mcg by mouth every morning.   multivitamin tablet Take 1 tablet by mouth daily.   senna-docusate 8.6-50 MG tablet Commonly known as:  Senokot-S Take 1 tablet by mouth 2 (two) times daily.   spiritus frumenti Soln Commonly known as:  ethyl alcohol Take 1 each by mouth daily.   SYSTANE ULTRA OP Place 1 drop into both eyes 2 (two) times daily.   thiamine 100 MG tablet Commonly known as:  VITAMIN B-1 Take 100 mg by mouth daily.   vitamin B-12 1000 MCG tablet Commonly known as:  CYANOCOBALAMIN Take 1,000 mcg by mouth daily.   Vitamin D3 2000 units capsule Take 2,000 Units by mouth daily.       Disposition and follow-up:   Shirley Strickland was discharged from [facility] in Stable condition.  At the hospital follow up visit please address:  1.  Shirley Strickland was evaluated for acute ankle injury after a  fall. She underwent ORIF on 08/20/2017 and had plans to follow up with orthopedic surgery 1 week after discharge for a wound recheck. Please assess if she has followed up appropriately.  Patient also experienced an AKI, with Cr elevated 2.92 with baseline around 0.9-1.0. This was resolving on discharge and found to be 1.99 on day of discharge. As requested below, please obtain BMP to assess Cr level.  2.  Labs / imaging needed at time of follow-up: BMP to assess kidney function, as patient had AKI while inpatient  3.  Pending labs/ test needing follow-up: None  Follow-up Appointments:  Contact information for follow-up providers    Leandrew Koyanagi, MD Follow up in 1  week(s).   Specialty:  Orthopedic Surgery Why:  For wound re-check Contact information: [place] [address] [phone]        Alvester Chou, NP. Schedule an appointment as soon as possible for a visit in 2 week(s).   Specialty:  Nurse Practitioner Why:  Please follow up with your primary physician regarding your recent hospitalization. Contact information: [facility] [place] [address] [phone]            Contact information for after-discharge care    Destination    [facility] Follow up.   Service:  Skilled Nursing Contact information: [address] [place] Hospital Course by problem list:   1. Type IIIA open trimalleolar fracture of right lower leg with dislocation Patient admitted with open fracture to right ankle after a fall. Dislocation reduced in the ED. Anesthesia performed nerve block and Orthopedic Surgery performed debridement and ORIF on 12/27. She was started on IV Clindamycin for prophylaxis with open fracture. She received 2 doses and then developed pruritus on her palms and forearms as well as a sensation of throat swelling. She was treated with IV solumedrol and benadryl with improvement in her symptoms back to baseline. Patient has documented allergies to penicillin, sulfa antibiotics, and ceftriaxone, so Clindamycin was discontinued and changed to Vancomycin + Ciprofloxacin (for GN/Pseudomonal coverage) after discussion with ID Pharmacist. Antibiotics continued for 72h total (last day 08/23/2017). Discharged to SNF with plan to re-enroll in hospice after rehab.  2. AKI Cr increased to 2.92 during hospitalization. Renal U/S, UA, and bladder scan unremarkable. Likely pre-renal secondary to dehydration and transient episode of hypotension. Managed with IV fluids with improvement in Cr. On day of  discharge  3. Hx of Atrial fibrillation Rate controlled on metoprolol prior to hospitalization. CHADS2-VASc score of at least 6, with 13.6% annual risk of CVA. Patient not on anticoagulation secondary to prior GI bleeds with anticoagulation that required hospitalization and pRBC transfusions. During hospitalization patient received DVT prophylaxis in setting of orthopedic surgery with Lovenox. The patient was transitioned from home metoprolol to carvedilol for improved BP management with continued rated control, as described below.   4. Acute on chronic anemia Patient observed to have a drop in her baseline hemoglobin of 11-12 after her surgery. We originally planned to discharge patient on 1/2 but we found that her Hgb was steadily trending downward to 7.2 on discharge. The patient received 1 unit of blood and her Hgb increased to 10.6 the following morning. It remained above 10 for 48 hours at time of discharge.  5. Hypertension Initially patient had hypotension during hospitalization, but  was later found to have severe hypertension with multiple SBP >200 on numerous occasions. The patient was restarted on amlodipine 10 mg for BP control. She was also transitioned from metoprolol to carvedilol for improved BP control. After these adjustments were made, the patient's SBP was found to be in the 160s on average (with intermittent readings in 150s and 180s). Please see how patient is doing on new medications.   Discharge Vitals:   BP (!) 153/55   Pulse 75   Temp 98.7 F (37.1 C) (Oral)   Resp 18   Ht 5\' 3"  (1.6 m)   Wt 167 lb 15.9 oz (76.2 kg)   SpO2 96%   BMI 29.76 kg/m   Pertinent Labs, Studies, and Procedures:  CBC Latest Ref Rng & Units 08/29/2017 08/28/2017 08/26/2017  WBC 4.0 - 10.5 K/uL 10.0 12.7(H) -  Hemoglobin 12.0 - 15.0 g/dL 10.2(L) 10.6(L) 9.6(L)  Hematocrit 36.0 - 46.0 % 31.9(L) 32.7(L) 29.4(L)  Platelets 150 - 400 K/uL 303 295 -   CMP Latest Ref Rng & Units 08/29/2017 08/28/2017  08/27/2017  Glucose 65 - 99 mg/dL 90 94 104(H)  BUN 6 - 20 mg/dL 36(H) 37(H) 38(H)  Creatinine 0.44 - 1.00 mg/dL 1.99(H) 2.15(H) 2.01(H)  Sodium 135 - 145 mmol/L 135 134(L) 134(L)  Potassium 3.5 - 5.1 mmol/L 4.4 4.4 4.8  Chloride 101 - 111 mmol/L 101 103 103  CO2 22 - 32 mmol/L 23 24 23   Calcium 8.9 - 10.3 mg/dL 8.9 9.1 9.2  Total Protein 6.5 - 8.1 g/dL - - -  Total Bilirubin 0.3 - 1.2 mg/dL - - -  Alkaline Phos 38 - 126 U/L - - -  AST 15 - 41 U/L - - -  ALT 14 - 54 U/L - - -   UA with ur prot 100  Right ankle x-ray 08/20/2017 Probable bimalleolar fracture dislocation of the right ankle with disruption of the ankle joint mortise. Fine detail of the medial malleolar fracture is not available on this study. The distal fibular metadiaphyseal fracture exhibits angulation, comminution, and displacement.  Right ankle x-ray 08/20/2017 Reduction of the dislocation at the right ankle marked improvement in the alignment and position of fracture fragments.  CXR 08/22/2017 1. No evidence of lung hypoexpansion to suggest airway obstruction secondary to a foreign body. A pill is unlikely to be visible on radiograph. 2. Minimal left basilar atelectasis. Suspect small left pleural effusion.  Renal U/S 08/23/2017 1. No mass or hydronephrosis. 2. Bilateral renal cortical volume loss.  Discharge Instructions: Discharge Instructions    Call MD for:  difficulty breathing, headache or visual disturbances   Complete by:  As directed    Call MD for:  persistant dizziness or light-headedness   Complete by:  As directed    Call MD for:  persistant nausea and vomiting   Complete by:  As directed    Call MD for:  persistant nausea and vomiting   Complete by:  As directed    Call MD for:  redness, tenderness, or signs of infection (pain, swelling, redness, odor or green/yellow discharge around incision site)   Complete by:  As directed    Call MD for:  severe uncontrolled pain   Complete by:  As  directed    Call MD for:  temperature >100.4   Complete by:  As directed    Call MD for:  temperature >100.4   Complete by:  As directed    Diet - low sodium heart healthy  Complete by:  As directed    Diet - low sodium heart healthy   Complete by:  As directed    Discharge instructions   Complete by:  As directed    You were evaluated for an ankle injury after a fall. Your operating surgeon would like to see you within 1 week of discharge from the hospital to check on your wounds. Please arrange follow up when you leave the hospital.   Discharge instructions   Complete by:  As directed    Please follow up with orthopedic surgery as an outpatient after discharge. Please use the information provided in this paperwork to schedule follow up. Please follow up with your primary care provider regarding your high blood pressure. In the hospital your BP was controlled with amlodipine 10 mg daily and carvedilol 6.25mg  two times a day. Please stop taking metoprolol 12.5 mg BID, as this medication is in the same class as carvedilol and will make your heart go too slow if you take both medications at the same time. Your primary care provider can help you understand these medication changes at follow up.   Increase activity slowly   Complete by:  As directed    Increase activity slowly   Complete by:  As directed       Signed: Thomasene Ripple, MD 08/29/2017, 12:43 PM   Pager: Mamie Nick [phone]

## 2017-08-26 NOTE — Plan of Care (Signed)
  Clinical Measurements: Will remain free from infection 08/26/2017 1127 - Adequate for Discharge by Governor Rooks, RN Diagnostic test results will improve 08/26/2017 1127 - Adequate for Discharge by Governor Rooks, RN Respiratory complications will improve 08/26/2017 1127 - Adequate for Discharge by Governor Rooks, RN Cardiovascular complication will be avoided 08/26/2017 1127 - Adequate for Discharge by Governor Rooks, RN   Activity: Risk for activity intolerance will decrease 08/26/2017 1127 - Adequate for Discharge by Governor Rooks, RN   Coping: Level of anxiety will decrease 08/26/2017 1127 - Adequate for Discharge by Governor Rooks, RN   Elimination: Will not experience complications related to bowel motility 08/26/2017 1127 - Completed/Met by Governor Rooks, RN Will not experience complications related to urinary retention 08/26/2017 1127 - Completed/Met by Governor Rooks, RN

## 2017-08-26 NOTE — Progress Notes (Signed)
Report called and given to [place] at [facility]. Answered all questions to satisfaction. Pt in no distress at time of discharge. [facility] to transport. All belongings being sent with her including signed DNR yellow form.

## 2017-08-26 NOTE — Progress Notes (Signed)
Physical Therapy Treatment Patient Details Name: Shirley Strickland MRN: [id number] DOB: 01-09-1916 Today's Date: 08/26/2017    History of Present Illness Pt is a 84 y/o female who presents s/p fall at home while attempting to transfer to her wheelchair. She was brought to the ED and found to have an open trimalleolar ankle fracture dislocation s/p ORIF on 08/20/17. PMH significant for DVT, CHF, CA, significantly HOH (talk into R ear).    PT Comments    Mobility limited this session as pt reports feeling like she was about to pass out with lowering HOB. BP at that time was 195/67. RN present to assess. Bed mobility performed for peri-care but OOB mobility deferred. Will continue to follow.    Follow Up Recommendations  SNF;Supervision/Assistance - 24 hour     Equipment Recommendations  None recommended by PT    Recommendations for Other Services       Precautions / Restrictions Precautions Precautions: Fall Precaution Comments: Lower legs very sensitive to touch - used sheets wrapped under ankles to pick up and move LE's during session.  Restrictions Weight Bearing Restrictions: Yes RLE Weight Bearing: Non weight bearing    Mobility  Bed Mobility Overal bed mobility: Needs Assistance Bed Mobility: Rolling Rolling: Mod assist   Supine to sit: Total assist;+2 for physical assistance;HOB elevated     General bed mobility comments: requires total (A) of sheet as leg lifter to help roll . pt rolling to the L better than R this session  Transfers                 General transfer comment: transfer attempt terminated due to elevated BP and symptomatic   Ambulation/Gait             General Gait Details: Not tested - pt only transfers at home, no ambulation.    Stairs            Wheelchair Mobility    Modified Rankin (Stroke Patients Only)       Balance Overall balance assessment: Needs assistance Sitting-balance support: Feet supported;No upper extremity  supported Sitting balance-Leahy Scale: Fair     Standing balance support: No upper extremity supported;During functional activity Standing balance-Leahy Scale: Zero Standing balance comment: +2 assist required.                             Cognition Arousal/Alertness: Awake/alert Behavior During Therapy: WFL for tasks assessed/performed Overall Cognitive Status: Within Functional Limits for tasks assessed                                        Exercises      General Comments General comments (skin integrity, edema, etc.): pt reports bruising easily and needing care for skin with all contact from staff      Pertinent Vitals/Pain Pain Assessment: Faces Faces Pain Scale: Hurts whole lot Pain Location: Bilateral lower legs Pain Descriptors / Indicators: Operative site guarding;Grimacing;Moaning Pain Intervention(s): Monitored during session    Home Living                      Prior Function            PT Goals (current goals can now be found in the care plan section) Acute Rehab PT Goals Patient Stated Goal: Get better, back to being able to  transfer to her wheelchair PT Goal Formulation: With patient/family Time For Goal Achievement: 09/04/17 Potential to Achieve Goals: Good Progress towards PT goals: Progressing toward goals    Frequency    Min 3X/week      PT Plan Current plan remains appropriate    Co-evaluation PT/OT/SLP Co-Evaluation/Treatment: Yes Reason for Co-Treatment: Complexity of the patient's impairments (multi-system involvement);For patient/therapist safety PT goals addressed during session: Mobility/safety with mobility OT goals addressed during session: ADL's and self-care;Proper use of Adaptive equipment and DME;Strengthening/ROM      AM-PAC PT "6 Clicks" Daily Activity  Outcome Measure  Difficulty turning over in bed (including adjusting bedclothes, sheets and blankets)?: Unable Difficulty moving from  lying on back to sitting on the side of the bed? : Unable Difficulty sitting down on and standing up from a chair with arms (e.g., wheelchair, bedside commode, etc,.)?: Unable Help needed moving to and from a bed to chair (including a wheelchair)?: Total Help needed walking in hospital room?: Total Help needed climbing 3-5 steps with a railing? : Total 6 Click Score: 6    End of Session Equipment Utilized During Treatment: Gait belt;Oxygen Activity Tolerance: Treatment limited secondary to medical complications (Comment)(Elevated BP) Patient left: in bed;with call bell/phone within reach;with nursing/sitter in room;with family/visitor present Nurse Communication: Mobility status;Other (comment)(RN present in room to assess) PT Visit Diagnosis: History of falling (Z91.81);Pain;Other abnormalities of gait and mobility (R26.89) Pain - Right/Left: Right Pain - part of body: Ankle and joints of foot     Time: 8527-7824 PT Time Calculation (min) (ACUTE ONLY): 20 min  Charges:  $Therapeutic Activity: 8-22 mins                    G Codes:       [name], PT, DPT Acute Rehabilitation Services Pager: [phone]    [name] 08/26/2017, 12:44 PM

## 2017-08-26 NOTE — Clinical Social Work Placement (Signed)
   CLINICAL SOCIAL WORK PLACEMENT  NOTE  Date:  08/26/2017  Patient Details  Name: CLEMENCIA HELZER MRN: [id number] Date of Birth: 10-15-1915  Clinical Social Work is seeking post-discharge placement for this patient at the [place] level of care (*CSW will initial, date and re-position this form in  chart as items are completed):  Yes   Patient/family provided with [facility] Work Department's list of facilities offering this level of care within the geographic area requested by the patient (or if unable, by the patient's family).  Yes   Patient/family informed of their freedom to choose among providers that offer the needed level of care, that participate in Medicare, Medicaid or managed care program needed by the patient, have an available bed and are willing to accept the patient.  Yes   Patient/family informed of [facility]'s ownership interest in [facility] and [facility], as well as of the fact that they are under no obligation to receive care at these facilities.  PASRR submitted to EDS on       PASRR number received on       Existing PASRR number confirmed on 09/21/17     FL2 transmitted to all facilities in geographic area requested by pt/family on       FL2 transmitted to all facilities within larger geographic area on 09/21/17     Patient informed that his/her managed care company has contracts with or will negotiate with certain facilities, including the following:        Yes   Patient/family informed of bed offers received.  Patient chooses bed at [facility]     Physician recommends and patient chooses bed at      Patient to be transferred to [facility] on 08/26/17.  Patient to be transferred to facility by [facility]     Patient family notified on 08/26/17 of transfer.  Name of family member notified:  neice at bedside     PHYSICIAN Please prepare priority discharge summary, including medications, Please prepare  prescriptions, Please sign DNR     Additional Comment:    _______________________________________________ Normajean Baxter, LCSW 08/26/2017, 2:04 PM

## 2017-08-26 NOTE — Social Work (Signed)
Clinical Social Worker facilitated patient discharge including contacting patient family and facility to confirm patient discharge plans.  Clinical information faxed to facility and family agreeable with plan.    CSW arranged ambulance transport via [facility] to [facility] .    RN to call [phone] to give report prior to discharge.  Clinical Social Worker will sign off for now as social work intervention is no longer needed. Please consult us again if new need arises.  Jamisen Hawes, LCSW Clinical Social Worker [phone]    

## 2017-08-27 DIAGNOSIS — D62 Acute posthemorrhagic anemia: Secondary | ICD-10-CM

## 2017-08-27 DIAGNOSIS — F419 Anxiety disorder, unspecified: Secondary | ICD-10-CM

## 2017-08-27 DIAGNOSIS — D500 Iron deficiency anemia secondary to blood loss (chronic): Secondary | ICD-10-CM

## 2017-08-27 DIAGNOSIS — I10 Essential (primary) hypertension: Secondary | ICD-10-CM

## 2017-08-27 LAB — BPAM RBC
Blood Product Expiration Date: 201901112359
ISSUE DATE / TIME: 201901021740
Unit Type and Rh: 600

## 2017-08-27 LAB — BASIC METABOLIC PANEL
ANION GAP: 8 (ref 5–15)
BUN: 38 mg/dL — ABNORMAL HIGH (ref 6–20)
CALCIUM: 9.2 mg/dL (ref 8.9–10.3)
CO2: 23 mmol/L (ref 22–32)
Chloride: 103 mmol/L (ref 101–111)
Creatinine, Ser: 2.01 mg/dL — ABNORMAL HIGH (ref 0.44–1.00)
GFR, EST AFRICAN AMERICAN: 22 mL/min — AB (ref >60)
GFR, EST NON AFRICAN AMERICAN: 19 mL/min — AB (ref >60)
Glucose, Bld: 104 mg/dL — ABNORMAL HIGH (ref 65–99)
POTASSIUM: 4.8 mmol/L (ref 3.5–5.1)
Sodium: 134 mmol/L — ABNORMAL LOW (ref 135–145)

## 2017-08-27 LAB — TYPE AND SCREEN
ABO/RH(D): A NEG
Antibody Screen: NEGATIVE
Unit division: 0

## 2017-08-27 MED ORDER — AMLODIPINE BESYLATE 5 MG PO TABS
5.0000 mg | ORAL_TABLET | Freq: Once | ORAL | Status: AC
  Administered 2017-08-27: 5 mg via ORAL
  Filled 2017-08-27: qty 1

## 2017-08-27 MED ORDER — HYDRALAZINE HCL 20 MG/ML IJ SOLN
2.0000 mg | Freq: Four times a day (QID) | INTRAMUSCULAR | Status: DC | PRN
Start: 2017-08-27 — End: 2017-08-29
  Administered 2017-08-28 – 2017-08-29 (×2): 2 mg via INTRAVENOUS
  Filled 2017-08-27 (×2): qty 1

## 2017-08-27 MED ORDER — ALPRAZOLAM 0.5 MG PO TABS
0.5000 mg | ORAL_TABLET | Freq: Three times a day (TID) | ORAL | Status: DC | PRN
  Administered 2017-08-27 – 2017-08-28 (×3): 0.5 mg via ORAL
  Filled 2017-08-27 (×3): qty 1

## 2017-08-27 MED ORDER — HYDRALAZINE HCL 20 MG/ML IJ SOLN
2.0000 mg | Freq: Once | INTRAMUSCULAR | Status: AC
  Administered 2017-08-27: 2 mg via INTRAVENOUS
  Filled 2017-08-27: qty 1

## 2017-08-27 MED ORDER — AMLODIPINE BESYLATE 10 MG PO TABS
10.0000 mg | ORAL_TABLET | Freq: Every day | ORAL | Status: DC
  Administered 2017-08-28: 10 mg via ORAL
  Filled 2017-08-27: qty 1

## 2017-08-27 NOTE — Progress Notes (Signed)
Medicine attending: Patient had to be returned to the hospital yesterday afternoon at my request when morning lab reviewed and showed further fall in hemoglobin to 7.3.  Blood pressure 557 systolic. She received a single unit of blood.  Immediate post transfusion hemoglobin 9.6. Blood pressure still at 322 systolic on return.  A dose of amlodipine 5 mg was given but unfortunately over the night blood pressure rose as high as 218/89. She remains asymptomatic.  Lungs are clear.  Regular cardiac rhythm. Impression : 1.  Admission December 27 for open fracture right ankle requiring surgery. 2.  Allergic reaction to clindamycin 3.  Aspiration of a Senokot laxative tablet 4.  Unexplained transient hypotensive episode  5.  Acute renal insufficiency likely ATN related to 4 with possible contribution from vancomycin toxicity 6.  Progressive postoperative anemia likely multifactorial.  Surgical losses plus acute renal injury. 7.  Poorly controlled essential hypertension which rebounded after initial hypotensive episode  Patient is cared for by 2 of her nieces.  One Niece here this morning.  Understandably upset with complications noted above and the fact that we discharged the patient prematurely.  I apologized and I take full responsibility for not having the patient in more optimal condition at time of discharge. The niece also feels that part of the patient's hypertension is due to agitation from having to come back to the hospital so soon.  Patient takes Xanax 0.5 mg every night before bed and was not routinely getting this medication which we will reinstitute at this time. We have made adjustments in her antihypertensives increasing her amlodipine from 5 to 10 mg daily and doubling her metoprolol from 12.5-25 mg twice daily.  We will use small as needed doses of hydralazine until better blood pressure control. Continue to monitor blood count and renal function.

## 2017-08-27 NOTE — Progress Notes (Signed)
Subjective:  Patient observed laying comfortably in bed this AM. She is anxious and upset about her return to the hospital. She states she is so anxious that she cannot sleep and is worried about BP. Does not want to leave hospital until BP much improved.  Objective: Vital signs in last 24 hours: Vitals:   08/27/17 0534 08/27/17 0814 08/27/17 1120 08/27/17 1300  BP: (!) 218/89 (!) 210/72 (!) 187/64 (!) 167/56  Pulse: 77   64  Resp: 16   16  Temp: 98.8 F (37.1 C)   98.4 F (36.9 C)  TempSrc: Oral   Oral  SpO2: 97%  96% 96%  Weight: 163 lb 9.3 oz (74.2 kg)     Height:       Physical Exam  Constitutional: She is oriented to person, place, and time.  Elderly woman laying comfortably in bed in no acute distress  Cardiovascular: Normal rate, regular rhythm and intact distal pulses. Exam reveals no gallop and no friction rub.  No murmur heard. Respiratory: Effort normal and breath sounds normal. No respiratory distress. She has no wheezes.  No crackles appreciated  GI: Soft. She exhibits no distension. There is no tenderness.  Musculoskeletal:  RLE with clean, dry bandage in place. Patient and family will not allow examiner to touch legs 2/2 pain with exam.  Neurological: She is alert and oriented to person, place, and time.  Gross movement of all 4 extremities intact.  Psychiatric: She has a normal mood and affect. Her behavior is normal. Thought content normal.   Assessment/Plan:  Shirley Strickland is a 84 yo F with a History of Atrial fibrillation, NSTEMI (suspected type II), CHF, HTN, DVT (IVC filter in place), osteoporosis, and hypothyroidism presents with right ankle fracture following a fall, now s/p debridement and ORIF 12/27 with Orthopedic Surgery.  Type IIIa open right trimalleolar ankle fracture dislocation after fall, s/p debridement and ORIF without fixation of posterior malleolus 12/27 and reduction of dislocation on 12/27. -Orthopedic Surgery consulted -Nonweightbearing on  RLE for ~6-8 weeks depending on evidence of healing -Will follow up with Ortho in 1 week for wound check -SW consulted for SNF placement -Delirium precautions  AKI: Peaked at 2.92, baseline is 0.9-1. Continuing to improve 2.0 today.  -Continue to follow with BMP  Acute on chronic anemia: Hgb dropped from 9.4 on 08/21/17 to 7.7 on 08/23/17, with repeat Hgb on 08/24/17 = 7.6. Observed to be 7.3 on 08/26/17 prior to discharge. The drop likely 2/2 AKI and some surgical blood loss in the setting of chronic anemia. Patient inadvertently discharged prior to Hgb 7.3 on 08/26/17 observed and patient returned to hospital for transfusion. Received 1 unit pRBC's yesterday, Hemoglobin adjusted appropriately to 9.6. -Continue to follow with CBC  HTN: BP elevated today with SBP >200 on multiple occassions. Patient has allergies listed in her chart which include ACE/ARB, HCTZ, and torsemide. Will plan to increase amlodipine to 10 mg today. -Continue to monitor  -Increase Amlodipine 10 mg -Maintain metoprolol 12.5mg  BID as HR consistently in 60-70s -PRN hydralazine 2 mg for SBP >180  Atrial Fibrillation: Rate controlled on metoprolol. Patient not currently on anticoagulation secondary to prior GI bleeds. - Home Metoprolol 12.5mg  BID - Low dose lovenox for DVT prophylaxis  Hypothyroidism - Continue home Synthroid 36mcg Daily  Anxiety: Drinks a beer every day at 3pm. Requests Bud Light to help with anxiety. Patient also says she needs xanax to sleep and calm down, as the past 24 hours have been stressful  for her. Karmen Stabs Frumenti once daily at 3pm -Xanax 0.5 mg q8 hours PRN for anxiety  VTE PPx: low dose Lovenox Dispo: SNF when BP improves  Thomasene Ripple, MD 08/27/2017, 3:05 PM Pager: Mamie Nick [phone]

## 2017-08-27 NOTE — Progress Notes (Addendum)
  Speech Language Pathology Treatment: Dysphagia  Patient Details Name: Shirley Strickland MRN: [id number] DOB: [birth year] Today's Date: 08/27/2017 Time: 1712-7871 SLP Time Calculation (min) (ACUTE ONLY): 9 min  Assessment / Plan / Recommendation Clinical Impression  Pt consumed thin liquids with one subtle throat clear noted across all intake. Her family present denies any observed difficulty across meals here since diet upgrade, and only occasional coughing at baseline PTA. She appears likely at or near her baseline, and seems to have been tolerating her current diet. SLP to sign off acutely.   HPI HPI: Shirley Strickland is a 84 yo F with a History of Atrial fibrillation, NSTEMI (suspected type II), CHF, HTN, DVT (IVC filter in place), osteoporosis, and hypothyroidism presents with right ankle fracture following a fall, now s/p debridement and ORIF 12/27 with Orthopedic Surgery. Has been NPO after coughing, suspected aspiration with pills yesterday. Swallow evaluation ordered.      SLP Plan  All goals met       Recommendations  Diet recommendations: Regular;Thin liquid Liquids provided via: Cup;Straw Medication Administration: Whole meds with puree Supervision: Patient able to self feed;Full supervision/cueing for compensatory strategies Compensations: Slow rate;Small sips/bites;Minimize environmental distractions Postural Changes and/or Swallow Maneuvers: Seated upright 90 degrees                Oral Care Recommendations: Oral care BID Follow up Recommendations: 24 hour supervision/assistance SLP Visit Diagnosis: Dysphagia, unspecified (R13.10) Plan: All goals met       GO                Shirley Strickland 08/27/2017, 4:14 PM  Shirley Strickland, M.A. CCC-SLP [phone]

## 2017-08-27 NOTE — Progress Notes (Signed)
Patient's BP has been rising during shift.  At 0534 BP was 218/89 HR 77.  On call contacted.  Verbal permission to give Norvasc and Metoprolol PO earlier than scheduled time from on-call MD.  Medications given.  Will continiue to monitor.

## 2017-08-28 LAB — CBC
HCT: 32.7 % — ABNORMAL LOW (ref 36.0–46.0)
HEMOGLOBIN: 10.6 g/dL — AB (ref 12.0–15.0)
MCH: 27.1 pg (ref 26.0–34.0)
MCHC: 32.4 g/dL (ref 30.0–36.0)
MCV: 83.6 fL (ref 78.0–100.0)
Platelets: 295 10*3/uL (ref 150–400)
RBC: 3.91 MIL/uL (ref 3.87–5.11)
RDW: 14.4 % (ref 11.5–15.5)
WBC: 12.7 10*3/uL — AB (ref 4.0–10.5)

## 2017-08-28 LAB — BASIC METABOLIC PANEL
ANION GAP: 7 (ref 5–15)
BUN: 37 mg/dL — AB (ref 6–20)
CHLORIDE: 103 mmol/L (ref 101–111)
CO2: 24 mmol/L (ref 22–32)
Calcium: 9.1 mg/dL (ref 8.9–10.3)
Creatinine, Ser: 2.15 mg/dL — ABNORMAL HIGH (ref 0.44–1.00)
GFR calc Af Amer: 21 mL/min — ABNORMAL LOW (ref >60)
GFR, EST NON AFRICAN AMERICAN: 18 mL/min — AB (ref >60)
GLUCOSE: 94 mg/dL (ref 65–99)
POTASSIUM: 4.4 mmol/L (ref 3.5–5.1)
Sodium: 134 mmol/L — ABNORMAL LOW (ref 135–145)

## 2017-08-28 MED ORDER — ALPRAZOLAM 0.5 MG PO TABS: 0.5000 mg | ORAL_TABLET | Freq: Every day | ORAL | Status: DC

## 2017-08-28 MED ORDER — CARVEDILOL 6.25 MG PO TABS
6.2500 mg | ORAL_TABLET | Freq: Two times a day (BID) | ORAL | Status: DC
  Administered 2017-08-28 (×2): 6.25 mg via ORAL
  Filled 2017-08-28 (×3): qty 1

## 2017-08-28 MED ORDER — SPIRONOLACTONE 25 MG PO TABS
25.0000 mg | ORAL_TABLET | Freq: Every day | ORAL | Status: DC
  Administered 2017-08-28: 25 mg via ORAL
  Filled 2017-08-28: qty 1

## 2017-08-28 NOTE — Progress Notes (Signed)
During skin assessment, right foot, great toe is red, warm to touch, and blanchable, no pain indicated by patient. Will notify MD and continue to monitor.

## 2017-08-28 NOTE — Progress Notes (Signed)
PT Cancellation Note  Patient Details Name: Shirley Strickland MRN: [id number] DOB: 1915/12/19   Cancelled Treatment:    Reason Eval/Treat Not Completed: Medical issues which prohibited therapy. Noted pt's BP continues to be elevated (194/65 0900 today). Will hold mobility at this time, however feel she would be appropriate for OOB to chair with use of Maxi Sky with nursing if pt is agreeable. Will continue to follow.    [name] 08/28/2017, 9:27 AM  [name], PT, DPT Acute Rehabilitation Services Pager: [phone]

## 2017-08-28 NOTE — Progress Notes (Addendum)
Medicine attending: I personally examined this patient on the day of planned discharge and I attest to the accuracy of the evaluation and management plan as recorded by resident physician Dr. Thomasene Ripple.  Blood pressure has been difficult to control.  Peak systolic blood pressure 242/35 in the last 24 hours.  Lowest blood pressure: she dropped suddenly from 194/65 to 120 this morning when she received a combination of carvedilol, amlodipine, spironolactone, and a Xanax tablet.  Pressure has stabilized over the course of the day.  Currently 151/48. Different caregiver is here today.  She gave [place] more context and background information about the patient.  The patient was recently enrolled with home hospice.  Family is most interested in quality of life and ability to keep the patient home.  High blood pressure has been a chronic problem.  In addition, family has noted exaggerated rises in blood pressure whenever the patient gets anxious or agitated which is the current situation with being in the hospital in an unfamiliar environment with multiple strange people coming in the room to see her. She is lethargic today but easily arousable and interactive.  Lungs overall clear.  Regular cardiac rhythm.  Abdomen soft and nontender. Erythematous changes of the great toe on her right foot ipsilateral side of the fractured ankle.  No breakdown in the skin. Pertinent lab: Creatinine has plateaued with no further decrease.  2.0 yesterday, 2.15 today. Exaggerated response to 1 unit blood transfusion for hemoglobin 7.3 on January 2 with current hemoglobin 10.6. We will continue to observe the patient closely over the next 24 hours.  If blood pressure remains in a safe range, then proceed with planned discharge to [facility] rehab facility.

## 2017-08-28 NOTE — Progress Notes (Signed)
Subjective:  Patient seen sleeping comfortably in bed in no acute distress. She was awoken easily but sedated from PRN xanax given for anxiety. She denied any acute complaints.  Objective: Vital signs in last 24 hours: Vitals:   08/27/17 1120 08/27/17 1300 08/27/17 1900 08/28/17 0437  BP: (!) 187/64 (!) 167/56 (!) 177/52 (!) 189/77  Pulse:  64 69 71  Resp:  16 16 18   Temp:  98.4 F (36.9 C) 98.4 F (36.9 C) 98.7 F (37.1 C)  TempSrc:  Oral Oral Oral  SpO2: 96% 96% 93% 92%  Weight:      Height:       Physical Exam  Constitutional:  Elderly woman laying comfortably in bed in no acute distress  Cardiovascular: Normal rate, regular rhythm and intact distal pulses. Exam reveals no gallop and no friction rub.  No murmur heard. Respiratory: Effort normal and breath sounds normal. No respiratory distress. She has no wheezes.  No crackles appreciated  GI: Soft. Bowel sounds are normal. She exhibits no distension. There is no tenderness.  Musculoskeletal:  RLE with clean, dry bandage in place. Patient and family will not allow examiner to touch legs 2/2 pain with exam.  Neurological:  Patient sleepy during exam today. Gross movement of all 4 extremities intact.  Skin: Skin is warm and dry. No erythema.  Some erythema of distal R toe, RLE wrapped. No skin breakdown or calor noted. Non tender with palpation.    Assessment/Plan:  Shirley Strickland is a 84 yo F with a History of Atrial fibrillation, NSTEMI (suspected type II), CHF, HTN, DVT (IVC filter in place), osteoporosis, and hypothyroidism presents with right ankle fracture following a fall, now s/p debridement and ORIF 12/27 with Orthopedic Surgery.  HTN: BP continues to be elevated, in 160s yesterday, 170s-180s overnight. On exam BP dropped to systolic of 992 on recheck after carvedilol and spironolactone given. Interval improvement from yesterday's readings, but still below goal and labile. Per family report patient routinely with BP  elevated around 180 at home and this was the reason she was on hospice prior to ankle fracture and hospitalization. Will change metoprolol to carvedilol for improved BP control and add spirolactone today -Continue to monitor with q4 hour vitals -Continue amlodipine 10 mg -START carvedilol 6.25 mg BID -START spirolactone 25 mg daily -PRN hydralazine 2 mg for SBP >180  Acute on chronic anemia: Hgb dropped from 9.4 on 08/21/17 to 7.7 on 08/23/17, with repeat Hgb on 08/24/17 = 7.6. Observed to be 7.3 on 08/26/17 prior to discharge. Patient's Hgb 10.6 today after transfusion.  AKI: Peaked at 2.92, baseline is 0.9-1. Continuing to improve 2.1 today. Will continue to monitor given labile BP.   -Continue to follow with BMP  Type IIIa open right trimalleolar ankle fracture dislocation after fall, s/p debridement and ORIF without fixation of posterior malleolus 12/27 and reduction of dislocation on 12/27. -Orthopedic Surgery consulted -Nonweightbearing on RLE for ~6-8 weeks depending on evidence of healing -Will follow up with Ortho in 1 week for wound check -SW consulted for SNF placement -Delirium precautions  Atrial Fibrillation: Switching rate control to carvedilol as described above for better BP management. Patient not currently on anticoagulation secondary to prior GI bleeds. -Carvedilol 6.25 mg BID -Low dose lovenox for DVT prophylaxis  Hypothyroidism -Continue home Synthroid 94mcg Daily  Anxiety:  -Spiritus Frumenti once daily at 3pm -Xanax 0.5 mg q8 hours PRN for anxiety  VTE PPx: low dose Lovenox Dispo: SNF when BP improves, appreciate social  work/case management assistance  Shirley Ripple, MD 08/28/2017, 7:01 AM Pager: Mamie Nick [phone]

## 2017-08-28 NOTE — Plan of Care (Signed)
  Progressing Pain Managment: General experience of comfort will improve 08/28/2017 0107 - Progressing by [place], RN Safety: Ability to remain free from injury will improve 08/28/2017 0107 - Progressing by [place] Pugh, RN

## 2017-08-28 NOTE — Progress Notes (Signed)
Dr. Berneice Gandy was notified and aware of BP changes. No additional interventions at this time.

## 2017-08-29 ENCOUNTER — Encounter (HOSPITAL_COMMUNITY): Payer: Self-pay

## 2017-08-29 DIAGNOSIS — I10 Essential (primary) hypertension: Secondary | ICD-10-CM

## 2017-08-29 DIAGNOSIS — N179 Acute kidney failure, unspecified: Secondary | ICD-10-CM

## 2017-08-29 DIAGNOSIS — D649 Anemia, unspecified: Secondary | ICD-10-CM

## 2017-08-29 DIAGNOSIS — W19XXXD Unspecified fall, subsequent encounter: Secondary | ICD-10-CM

## 2017-08-29 DIAGNOSIS — S82851D Displaced trimalleolar fracture of right lower leg, subsequent encounter for closed fracture with routine healing: Secondary | ICD-10-CM

## 2017-08-29 LAB — BASIC METABOLIC PANEL
ANION GAP: 11 (ref 5–15)
BUN: 36 mg/dL — ABNORMAL HIGH (ref 6–20)
CO2: 23 mmol/L (ref 22–32)
Calcium: 8.9 mg/dL (ref 8.9–10.3)
Chloride: 101 mmol/L (ref 101–111)
Creatinine, Ser: 1.99 mg/dL — ABNORMAL HIGH (ref 0.44–1.00)
GFR, EST AFRICAN AMERICAN: 23 mL/min — AB (ref >60)
GFR, EST NON AFRICAN AMERICAN: 20 mL/min — AB (ref >60)
Glucose, Bld: 90 mg/dL (ref 65–99)
Potassium: 4.4 mmol/L (ref 3.5–5.1)
Sodium: 135 mmol/L (ref 135–145)

## 2017-08-29 LAB — CBC
HCT: 31.9 % — ABNORMAL LOW (ref 36.0–46.0)
HEMOGLOBIN: 10.2 g/dL — AB (ref 12.0–15.0)
MCH: 27.2 pg (ref 26.0–34.0)
MCHC: 32 g/dL (ref 30.0–36.0)
MCV: 85.1 fL (ref 78.0–100.0)
Platelets: 303 10*3/uL (ref 150–400)
RBC: 3.75 MIL/uL — AB (ref 3.87–5.11)
RDW: 14.6 % (ref 11.5–15.5)
WBC: 10 10*3/uL (ref 4.0–10.5)

## 2017-08-29 MED ORDER — AMLODIPINE BESYLATE 10 MG PO TABS
10.0000 mg | ORAL_TABLET | Freq: Every day | ORAL | Status: DC
  Administered 2017-08-29: 10 mg via ORAL
  Filled 2017-08-29: qty 1

## 2017-08-29 MED ORDER — CARVEDILOL 6.25 MG PO TABS
6.2500 mg | ORAL_TABLET | Freq: Two times a day (BID) | ORAL | Status: DC
  Administered 2017-08-29: 6.25 mg via ORAL
  Filled 2017-08-29: qty 1

## 2017-08-29 MED ORDER — AMLODIPINE BESYLATE 10 MG PO TABS: 10.0000 mg | ORAL_TABLET | Freq: Every day | ORAL | 0 refills | Status: AC

## 2017-08-29 MED ORDER — CARVEDILOL 6.25 MG PO TABS: 6.2500 mg | ORAL_TABLET | Freq: Two times a day (BID) | ORAL | 0 refills | Status: AC

## 2017-08-29 NOTE — Clinical Social Work Note (Signed)
Clinical Social Worker facilitated patient discharge including contacting patient family and facility to confirm patient discharge plans.  Clinical information faxed to facility and family agreeable with plan.  CSW arranged ambulance transport via [place] to [place] [facility] .  RN to call [phone] for report prior to discharge. Patient will go to room 908P on [facility].  Clinical Social Worker will sign off for now as social work intervention is no longer needed. Please consult [place] again if new need arises.  [place], [place]

## 2017-08-29 NOTE — Progress Notes (Signed)
Called [facility] office per patients family request.  Stated that patient is next on the list.  Patients niece at bedside and asked how long that would be.  Stated that [facility] could not give a specific time.  Patients niece expressed frustration at getting patient over to facility later in day and expected the transfer to be earlier since patient MD team had been by this AM.  Explained to patients niece that paperwork needed to be completed prior to PTAR being set up.  Once patients discharge was completed that PTAR would get here when they were able to.  Patients niece stated understanding but still expressed frustration at situation taking so long.

## 2017-08-29 NOTE — Progress Notes (Signed)
Patient transported via [place] to [facility].

## 2017-08-29 NOTE — Progress Notes (Signed)
Report called to Paticia Stack, LPN. Awaiting [facility] to come transport patient.

## 2017-08-29 NOTE — Progress Notes (Signed)
Internal Medicine Attending:   I saw and examined the patient. I reviewed the resident's note and I agree with the resident's findings and plan as documented in the resident's note. Patient without complaints, Hgb improved/stable. BP improved today, stable for D/C to SNF, discussed with patient and niece at bedside.

## 2017-08-29 NOTE — Progress Notes (Signed)
Patients niece expressing anger at discharge transfer taking so long.  Requested to speak to MD.  Paged Dr. Berneice Gandy. Awaiting callback.  Called [facility] call center which stated that they have had a large influx of calls today and are trying their best to get here as soon as possible.  IM resident called back. Informed her of situation.

## 2017-08-29 NOTE — Progress Notes (Signed)
INTERNAL MEDICINE INTERVAL PROGRESS NOTE  Paged by RN around 6 pm this evening that the patients niece is angry and upset that it is taking so long for her aunt to be transported to facility. When introducing myself on the phone, she immediately started yelling into the phone about how her aunt has a heart condition and this waiting is stressing the patient out. Continued stating it's absolutely not okay that she is next in line to be transported and that she should have been transported earlier this morning. She yells that she was told she would be discharged this morning during rounds at 10am and it's unacceptable that the [place] [facility] has needed to transport other patients today. I tried to have calm conversation with the patients niece explaining that her aunt is the next patient in line for transport and they would be arriving shortly. She continued yelling and I politely ended the conversation as her anger continued to escalate.  The patient will be transported today to facility as soon as Corey Harold is available. I greatly appreciate nursing staff for their patience.   Einar Gip, DO Internal Medicine PGY2

## 2017-08-29 NOTE — Progress Notes (Signed)
   Subjective:  Patient seen laying comfortably in bed in no acute distress. She denies any pain, headache but states that she has a really dry mouth. No other acute complaints. Patient states she is ready for discharge to SNF and would like to get there early today.  Objective: Vital signs in last 24 hours: Vitals:   08/29/17 0546 08/29/17 0607 08/29/17 0844 08/29/17 0934  BP: (!) 199/66  (!) 173/59 (!) 153/55  Pulse: 75  81 75  Resp: 15  18   Temp: 98.8 F (37.1 C)  98.7 F (37.1 C)   TempSrc: Oral  Oral   SpO2: 93%  96%   Weight:  167 lb 15.9 oz (76.2 kg)    Height:       Physical Exam  Constitutional:  Elderly woman laying comfortably in bed in no acute distress  Cardiovascular: Normal rate and regular rhythm.  No murmur heard. 2+ radial pulses bilaterally  Respiratory: Effort normal and breath sounds normal. No respiratory distress.  GI: Soft. She exhibits no distension. There is no tenderness.  Musculoskeletal:  Pain limited exam.  Neurological:  Patient more alert and talkative during exam today. Gross motor and sensation of all 4 extremities intact bilaterally.  Skin: Skin is warm and dry. No erythema.  Some erythema of distal R toe, RLE wrapped. No skin breakdown or calor noted, no interval change from yesterday's exam. Chronic skin changes on LLE, unchanged from previous exams.   Assessment/Plan:  Ms. Steppe is a 84 yo F with a History of Atrial fibrillation, NSTEMI (suspected type II), CHF, HTN, DVT (IVC filter in place), osteoporosis, and hypothyroidism presents with right ankle fracture following a fall, now s/p debridement and ORIF 12/27 with Orthopedic Surgery.  HTN: BP continues to be labile but with overall improvement in past 24 hours, with last two BP readings with systolic <710. Per discussion with niece, patient chronically hypertensive and this is one of the reasons she was on hospice prior to discharge. She usually has sBP readings above 180 at home and they  are OK with looser goal of SBP around 160-170s prior to discharge. -Continue to monitor with 6 hour vital signs.  -Continue amlodipine 10 mg -Continue carvedilol 6.25 mg BID -PRN hydralazine 2 mg for SBP >180  Acute on chronic anemia: Hgb dropped from 9.4 on 08/21/17 to 7.7 on 08/23/17, with repeat Hgb on 08/24/17 = 7.6. Observed to be 7.3 on 08/26/17 prior to discharge. Patient's Hgb stable around 10 on AM labs.  AKI: Peaked at 2.92, baseline is 0.9-1. Continuing to improve to 1.99 today. -Follow up as outpatient with BMP  Type IIIa open right trimalleolar ankle fracture dislocation after fall, s/p debridement and ORIF without fixation of posterior malleolus 12/27 and reduction of dislocation on 12/27. -Orthopedic Surgery consulted -Nonweightbearing on RLE for ~6-8 weeks depending on evidence of healing -Will follow up with Ortho in 1 week for wound check  Atrial Fibrillation: Switching rate control to carvedilol as described above for better BP management. Patient not currently on chronic anticoagulation as outpatient secondary to prior GI bleeds. -Carvedilol 6.25 mg BID -Low dose lovenox for DVT prophylaxis  Hypothyroidism -Continue home Synthroid 58mcg Daily  Anxiety:  -Spiritus Frumenti once daily at 3pm -Xanax 0.5 mg q8 hours PRN for anxiety  VTE PPx: low dose Lovenox Dispo: To SNF of patient's choosing today, appreciate SW assistance coordinating discharge  Thomasene Ripple, MD 08/29/2017, 9:53 AM Pager: Mamie Nick [phone]

## 2017-08-29 NOTE — Clinical Social Work Placement (Addendum)
   CLINICAL SOCIAL WORK PLACEMENT  NOTE  Date:  08/29/2017  Patient Details  Name: Shirley Strickland MRN: [id number] Date of Birth: Jan 08, 1916  Clinical Social Work is seeking post-discharge placement for this patient at the [place] level of care (*CSW will initial, date and re-position this form in  chart as items are completed):  Yes   Patient/family provided with [facility] Work Department's list of facilities offering this level of care within the geographic area requested by the patient (or if unable, by the patient's family).  Yes   Patient/family informed of their freedom to choose among providers that offer the needed level of care, that participate in Medicare, Medicaid or managed care program needed by the patient, have an available bed and are willing to accept the patient.  Yes   Patient/family informed of [facility]'s ownership interest in [facility] and [facility], as well as of the fact that they are under no obligation to receive care at these facilities.  PASRR submitted to EDS on       PASRR number received on       Existing PASRR number confirmed on 09/21/17     FL2 transmitted to all facilities in geographic area requested by pt/family on       FL2 transmitted to all facilities within larger geographic area on 09/21/17     Patient informed that his/her managed care company has contracts with or will negotiate with certain facilities, including the following:        Yes   Patient/family informed of bed offers received.  Patient chooses bed at [facility]     Physician recommends and patient chooses bed at      Patient to be transferred to [facility] on  .  Patient to be transferred to facility by [facility]     Patient family notified on   of transfer.  Name of family member notified:     PHYSICIAN Please prepare priority discharge summary, including medications, Please prepare prescriptions     Additional Comment:     _______________________________________________ Eileen Stanford, LCSW 08/29/2017, 11:00 AM

## 2017-09-04 ENCOUNTER — Encounter (HOSPITAL_COMMUNITY): Payer: Self-pay | Admitting: Emergency Medicine

## 2017-09-04 DIAGNOSIS — I5032 Chronic diastolic (congestive) heart failure: Secondary | ICD-10-CM | POA: Insufficient documentation

## 2017-09-04 DIAGNOSIS — Z7982 Long term (current) use of aspirin: Secondary | ICD-10-CM | POA: Insufficient documentation

## 2017-09-04 DIAGNOSIS — N184 Chronic kidney disease, stage 4 (severe): Secondary | ICD-10-CM | POA: Diagnosis not present

## 2017-09-04 DIAGNOSIS — I130 Hypertensive heart and chronic kidney disease with heart failure and stage 1 through stage 4 chronic kidney disease, or unspecified chronic kidney disease: Secondary | ICD-10-CM | POA: Diagnosis present

## 2017-09-04 DIAGNOSIS — I160 Hypertensive urgency: Secondary | ICD-10-CM | POA: Insufficient documentation

## 2017-09-04 DIAGNOSIS — Z85828 Personal history of other malignant neoplasm of skin: Secondary | ICD-10-CM | POA: Diagnosis not present

## 2017-09-04 DIAGNOSIS — Z79899 Other long term (current) drug therapy: Secondary | ICD-10-CM | POA: Insufficient documentation

## 2017-09-04 DIAGNOSIS — E039 Hypothyroidism, unspecified: Secondary | ICD-10-CM | POA: Insufficient documentation

## 2017-09-04 DIAGNOSIS — I252 Old myocardial infarction: Secondary | ICD-10-CM | POA: Insufficient documentation

## 2017-09-04 LAB — CBC
HCT: 32.7 % — ABNORMAL LOW (ref 36.0–46.0)
HEMOGLOBIN: 10.7 g/dL — AB (ref 12.0–15.0)
MCH: 28 pg (ref 26.0–34.0)
MCHC: 32.7 g/dL (ref 30.0–36.0)
MCV: 85.6 fL (ref 78.0–100.0)
Platelets: 332 10*3/uL (ref 150–400)
RBC: 3.82 MIL/uL — ABNORMAL LOW (ref 3.87–5.11)
RDW: 14.8 % (ref 11.5–15.5)
WBC: 9.9 10*3/uL (ref 4.0–10.5)

## 2017-09-04 LAB — BASIC METABOLIC PANEL
ANION GAP: 9 (ref 5–15)
BUN: 21 mg/dL — AB (ref 6–20)
CALCIUM: 8.7 mg/dL — AB (ref 8.9–10.3)
CO2: 25 mmol/L (ref 22–32)
CREATININE: 1.34 mg/dL — AB (ref 0.44–1.00)
Chloride: 102 mmol/L (ref 101–111)
GFR calc Af Amer: 37 mL/min — ABNORMAL LOW (ref >60)
GFR, EST NON AFRICAN AMERICAN: 32 mL/min — AB (ref >60)
GLUCOSE: 163 mg/dL — AB (ref 65–99)
Potassium: 3.8 mmol/L (ref 3.5–5.1)
Sodium: 136 mmol/L (ref 135–145)

## 2017-09-04 LAB — I-STAT TROPONIN, ED: TROPONIN I, POC: 0.01 ng/mL (ref 0.00–0.08)

## 2017-09-04 MED ORDER — ALPRAZOLAM 0.25 MG PO TABS
0.5000 mg | ORAL_TABLET | Freq: Once | ORAL | Status: AC
  Administered 2017-09-04: 0.5 mg via ORAL
  Filled 2017-09-04: qty 2

## 2017-09-04 NOTE — ED Notes (Signed)
Pt niece reports to this RN that it is ridiculous that her aunt is still waiting... Informed her of wait time and apologized stated that they are doing everything they can in the back. Offered pt home time xanax.

## 2017-09-04 NOTE — ED Triage Notes (Signed)
BIB EMS from [facility], called out for HTN, pt has no complaints. Pt remains in triage d/t having healing fx to R ankle.

## 2017-09-05 ENCOUNTER — Emergency Department (HOSPITAL_COMMUNITY)
Admission: EM | Admit: 2017-09-05 | Discharge: 2017-09-05 | Disposition: A | Discharge status: Home / Self Care | Payer: Medicare Other | Attending: Emergency Medicine | Admitting: Emergency Medicine

## 2017-09-05 DIAGNOSIS — I160 Hypertensive urgency: Secondary | ICD-10-CM

## 2017-09-05 LAB — URINALYSIS, ROUTINE W REFLEX MICROSCOPIC
Bacteria, UA: NONE SEEN
Bilirubin Urine: NEGATIVE
GLUCOSE, UA: NEGATIVE mg/dL
Hgb urine dipstick: NEGATIVE
KETONES UR: NEGATIVE mg/dL
LEUKOCYTES UA: NEGATIVE
Nitrite: NEGATIVE
PROTEIN: 100 mg/dL — AB
Specific Gravity, Urine: 1.011 (ref 1.005–1.030)
WBC UA: NONE SEEN WBC/hpf (ref 0–5)
pH: 6 (ref 5.0–8.0)

## 2017-09-05 MED ORDER — CLONIDINE HCL 0.1 MG PO TABS
0.1000 mg | ORAL_TABLET | Freq: Once | ORAL | Status: AC
  Administered 2017-09-05: 0.1 mg via ORAL
  Filled 2017-09-05: qty 1

## 2017-09-05 NOTE — ED Notes (Signed)
[facility] called for transport.  

## 2017-09-05 NOTE — Discharge Instructions (Signed)
Continue medications as before.  Follow-up with your primary doctor next week.

## 2017-09-05 NOTE — ED Provider Notes (Signed)
[facility] EMERGENCY DEPARTMENT Provider Note   CSN: [id number] Arrival date & time: 09/04/17  2033     History   Chief Complaint Chief Complaint  Patient presents with  . Hypertension    HPI Shirley Strickland is a 84 y.o. female.  Patient is a 84 year old female sent from [facility] rehab center for evaluation of elevated blood pressure.  She is they are rehabilitating from a broken ankle.  Today her blood pressures were well in excess of 200 and she was sent here for further evaluation.  The patient has no complaints otherwise and states that she feels well.   The history is provided by the patient.  Hypertension  The problem occurs constantly. The problem has been gradually worsening. Pertinent negatives include no chest pain, no headaches and no shortness of breath. Nothing aggravates the symptoms. Nothing relieves the symptoms. She has tried nothing for the symptoms.    Past Medical History:  Diagnosis Date  . A-fib ([place])   . 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 Bruise    bruise on left foot from fall 07/03/11  . Cancer [facility])    pt states only skin cancer  . CHF (congestive heart failure) ([place])   . DVT (deep venous thrombosis) ([place]) 12/28/2013   LEFT   . Fall   . GERD (gastroesophageal reflux disease)   . HTN (hypertension)   . Knee pain, acute 07/04/11   Pt states left knee twisted under her in fall 07/03/11 and now painful  . Numbness of legs    started after fall yesterday 07/03/11  . Open ankle fracture    Right  . Osteopenia   . Pneumonia   . Rash and nonspecific skin eruption 07/04/11   rash on bilateral upper legs  . Syncope     Patient Active Problem List   Diagnosis Date Noted  . Benign essential HTN   . Hypotension   . Drug reaction   . Anemia of chronic renal failure, stage 4 (severe) ([place])   . Ankle fracture   . Aspiration into airway   . Displaced trimalleolar fracture of right lower leg, initial encounter for open  fracture type IIIA, IIIB, or IIIC 08/20/2017  . Hypothyroidism   . Elevated troponin   . AKI (acute kidney injury) ([place])   . Non-ST elevation MI (NSTEMI) ([place])   . Demand ischemia of myocardium ([place]) 06/08/2017  . Chest pain 06/07/2017  . DVT (deep venous thrombosis)-left 12/28/2013  . Acute deep vein thrombosis (DVT) of femoral vein of left lower extremity ([place]) 09/29/2013  . Anemia 09/29/2013  . Cellulitis 09/23/2013  . Hyponatremia 08/31/2013  . UTI (urinary tract infection) 08/31/2013  . Chronic diastolic heart failure ([place]) 08/25/2013  . Left fibular fracture 08/24/2013  . Laceration of left leg 08/23/2013  . Syncope and collapse 08/23/2013  . HTN (hypertension) 08/23/2013  . Fall at home 08/23/2013  . Sacral insufficiency fracture 07/07/2011  . Fall   . Atrial fibrillation with RVR [facility])     Past Surgical History:  Procedure Laterality Date  . APPENDECTOMY    . CATARACT EXTRACTION    . ELBOW SURGERY     right  . EXPLORATORY LAPAROTOMY  07/04/11   years ago to find cause of bleeding  . I&D EXTREMITY Right 08/20/2017   Procedure: IRRIGATION AND DEBRIDEMENT EXTREMITY WITH PIN FIXATION RIGHT ANKLE;  Surgeon: Leandrew Koyanagi, MD;  Location: [place];  Service: Orthopedics;  Laterality: Right;  OB History    No data available       Home Medications    Prior to Admission medications   Medication Sig Start Date End Date Taking? Authorizing Provider  acetaminophen (TYLENOL) 500 MG tablet Take 1,000 mg by mouth 3 (three) times daily.   Yes [provider]  ALPRAZolam Duanne Moron) 0.5 MG tablet Take 0.5 mg by mouth at bedtime.    Yes [provider]  aspirin EC 81 MG tablet Take 81 mg by mouth daily.   Yes [provider]  Cholecalciferol (VITAMIN D3) 2000 units capsule Take 2,000 Units by mouth daily.   Yes [provider]  cloNIDine (CATAPRES) 0.1 MG tablet Take 0.1 mg by mouth daily as needed (if blood pressure is over 170/90).   Yes  [provider]  folic acid (FOLVITE) 1 MG tablet Take 1 mg by mouth daily.   Yes [provider]  levothyroxine (SYNTHROID, LEVOTHROID) 88 MCG tablet Take 44 mcg by mouth every morning. 05/19/17  Yes [provider]  metoprolol tartrate (LOPRESSOR) 25 MG tablet Take 12.5 mg by mouth 2 (two) times daily. Hold if SBP is below 120 and HR below 60   Yes [provider]  Multiple Vitamin (MULTIVITAMIN) tablet Take 1 tablet by mouth daily.   Yes [provider]  Polyethyl Glycol-Propyl Glycol (SYSTANE ULTRA OP) Place 1 drop into both eyes 2 (two) times daily.   Yes [provider]  thiamine (VITAMIN B-1) 100 MG tablet Take 100 mg by mouth daily.   Yes [provider]  vitamin B-12 (CYANOCOBALAMIN) 1000 MCG tablet Take 1,000 mcg by mouth daily.   Yes [provider]  acetaminophen (TYLENOL) 325 MG tablet Take 2 tablets (650 mg total) by mouth every 6 (six) hours as needed for mild pain or moderate pain. Patient not taking: Reported on 09/05/2017 08/26/17   Thomasene Ripple, MD  amLODipine (NORVASC) 10 MG tablet Take 1 tablet (10 mg total) by mouth daily. Patient not taking: Reported on 09/05/2017 08/30/17   Thomasene Ripple, MD  amLODipine (NORVASC) 5 MG tablet Take 1 tablet (5 mg total) by mouth daily. Patient not taking: Reported on 09/05/2017 08/26/17   Thomasene Ripple, MD  carvedilol (COREG) 6.25 MG tablet Take 1 tablet (6.25 mg total) by mouth 2 (two) times daily with a meal. Patient not taking: Reported on 09/05/2017 08/29/17   Thomasene Ripple, MD  spiritus frumenti (ETHYL ALCOHOL) SOLN Take 1 each by mouth daily. Patient not taking: Reported on 09/05/2017 08/26/17   Thomasene Ripple, MD    Family History Family History  Problem Relation Age of Onset  . Cancer Brother     Social History Social History   Tobacco Use  . Smoking status: Never Smoker  . Smokeless tobacco: Never Used  Substance Use Topics  . Alcohol use: Yes     Alcohol/week: 1.2 oz    Types: 2 Cans of beer per week    Comment: 2 cans beer per day   12/28/2013  DRINKS ONLY  1 CAN A DAY  . Drug use: No     Allergies   Rocephin [ceftriaxone sodium in dextrose]; Clarithromycin; Ward [fish allergy]; Codeine; Darvocet [propoxyphene n-acetaminophen]; Demadex [torsemide]; Diovan hct [valsartan-hydrochlorothiazide]; Ex-lax [senna]; Ibuprofen; Indanediones; Indapamide; Morphine and related; Sulfa antibiotics; Benzonatate; Penicillins; and Septra [bactrim]   Review of Systems Review of Systems  Respiratory: Negative for shortness of breath.   Cardiovascular: Negative for chest pain.  Neurological: Negative for headaches.  All other systems  reviewed and are negative.    Physical Exam Updated Vital Signs BP (!) 193/65   Pulse 64   Temp 98 F (36.7 C) (Oral)   Resp 18   Ht 5\' 3"  (1.6 m)   Wt 74.8 kg (165 lb)   SpO2 93%   BMI 29.23 kg/m   Physical Exam  Constitutional: She is oriented to person, place, and time. She appears well-developed and well-nourished. No distress.  HENT:  Head: Normocephalic and atraumatic.  Neck: Normal range of motion. Neck supple.  Cardiovascular: Normal rate and regular rhythm. Exam reveals no gallop and no friction rub.  No murmur heard. Pulmonary/Chest: Effort normal and breath sounds normal. No respiratory distress. She has no wheezes.  Abdominal: Soft. Bowel sounds are normal. She exhibits no distension. There is no tenderness.  Musculoskeletal: Normal range of motion.  Neurological: She is alert and oriented to person, place, and time.  Skin: Skin is warm and dry. She is not diaphoretic.  Nursing note and vitals reviewed.    ED Treatments / Results  Labs (all labs ordered are listed, but only abnormal results are displayed) Labs Reviewed  BASIC METABOLIC PANEL - Abnormal; Notable for the following components:      Result Value   Glucose, Bld 163 (*)    BUN 21 (*)    Creatinine, Ser 1.34 (*)     Calcium 8.7 (*)    GFR calc non Af Amer 32 (*)    GFR calc Af Amer 37 (*)    All other components within normal limits  CBC - Abnormal; Notable for the following components:   RBC 3.82 (*)    Hemoglobin 10.7 (*)    HCT 32.7 (*)    All other components within normal limits  URINALYSIS, ROUTINE W REFLEX MICROSCOPIC - Abnormal; Notable for the following components:   Protein, ur 100 (*)    Squamous Epithelial / LPF 0-5 (*)    All other components within normal limits  I-STAT TROPONIN, ED    EKG  EKG Interpretation None       Radiology No results found.  Procedures Procedures (including critical care time)  Medications Ordered in ED Medications  cloNIDine (CATAPRES) tablet 0.1 mg (not administered)  ALPRAZolam (XANAX) tablet 0.5 mg (0.5 mg Oral Given 09/04/17 2348)     Initial Impression / Assessment and Plan / ED Course  I have reviewed the triage vital signs and the nursing notes.  Pertinent labs & imaging results that were available during my care of the patient were reviewed by me and considered in my medical decision making (see chart for details).  Patient with hypertension/elevated blood pressure at her rehab facility.  She has an order for as needed clonidine, however it does not appear as though it was given.  Her workup is unremarkable.  She was given clonidine here and her blood pressure has improved.  I see no indication for any further workup.  She will be discharged, to follow-up as needed.  Final Clinical Impressions(s) / ED Diagnoses   Final diagnoses:  None    ED Discharge Orders    None       Veryl Speak, MD 09/05/17 [phone]

## 2017-09-18 ENCOUNTER — Ambulatory Visit (INDEPENDENT_AMBULATORY_CARE_PROVIDER_SITE_OTHER): Payer: No Typology Code available for payment source | Admitting: Orthopaedic Surgery

## 2017-09-18 ENCOUNTER — Encounter (INDEPENDENT_AMBULATORY_CARE_PROVIDER_SITE_OTHER): Payer: Self-pay | Admitting: Orthopaedic Surgery

## 2017-09-18 ENCOUNTER — Telehealth (INDEPENDENT_AMBULATORY_CARE_PROVIDER_SITE_OTHER): Payer: Self-pay | Admitting: Radiology

## 2017-09-18 ENCOUNTER — Ambulatory Visit (INDEPENDENT_AMBULATORY_CARE_PROVIDER_SITE_OTHER): Payer: No Typology Code available for payment source

## 2017-09-18 DIAGNOSIS — S82851C Displaced trimalleolar fracture of right lower leg, initial encounter for open fracture type IIIA, IIIB, or IIIC: Secondary | ICD-10-CM

## 2017-09-18 NOTE — Progress Notes (Signed)
Patient is 4 weeks status post washout of an open fracture dislocation and stabilization with Steinmann pins.  She follows up today for her first postop check.  The traumatic wound has healed.  The pin sites are healed over also.  Her foot is neurovascular intact.  Her x-rays show stable fixation and alignment of the fracture.  At this point we remove the sutures.  Continue nonweightbearing for another 6 weeks.  Patient is on hospice and has a life expectancy of less than 6 months.  I think ultimately she will be able to pivot with that foot but not be able to ambulate any significant distances.  Follow-up in 6 weeks with three-view x-rays of the right ankle.  Cam walker was provided today.

## 2017-09-18 NOTE — Telephone Encounter (Signed)
Shelle Iron calling from [facility] department. Patient returned to SNF today after leaving our office with orders to be nonweightbearing RLE. Patients family however refused the fracture boot. They are needing documentation of this that patient refused boot in the office. This will be faxed to Kim at [phone].

## 2017-09-21 ENCOUNTER — Telehealth (INDEPENDENT_AMBULATORY_CARE_PROVIDER_SITE_OTHER): Payer: Self-pay

## 2017-09-21 NOTE — Telephone Encounter (Signed)
Patients daughter came to get Fx Boot. States therapist will not do anything without fx boot.

## 2017-10-23 DEATH — deceased

## 2017-10-30 ENCOUNTER — Ambulatory Visit (INDEPENDENT_AMBULATORY_CARE_PROVIDER_SITE_OTHER): Payer: Medicare Other | Admitting: Orthopaedic Surgery

## 2019-07-15 IMAGING — DX DG ANKLE PORT 2V*R*
1 series · 2 of 2 positions shown · non-contrast
Comparison: 08/20/2017 [DATE] a.m.

CLINICAL DATA: Fracture dislocation of the right ankle.

EXAM:
PORTABLE RIGHT ANKLE - 2 VIEW [DATE] p.m.

[Series 1: ankle · 0.14mm/px · 2 of 2 slices shown]
[im 1/2]
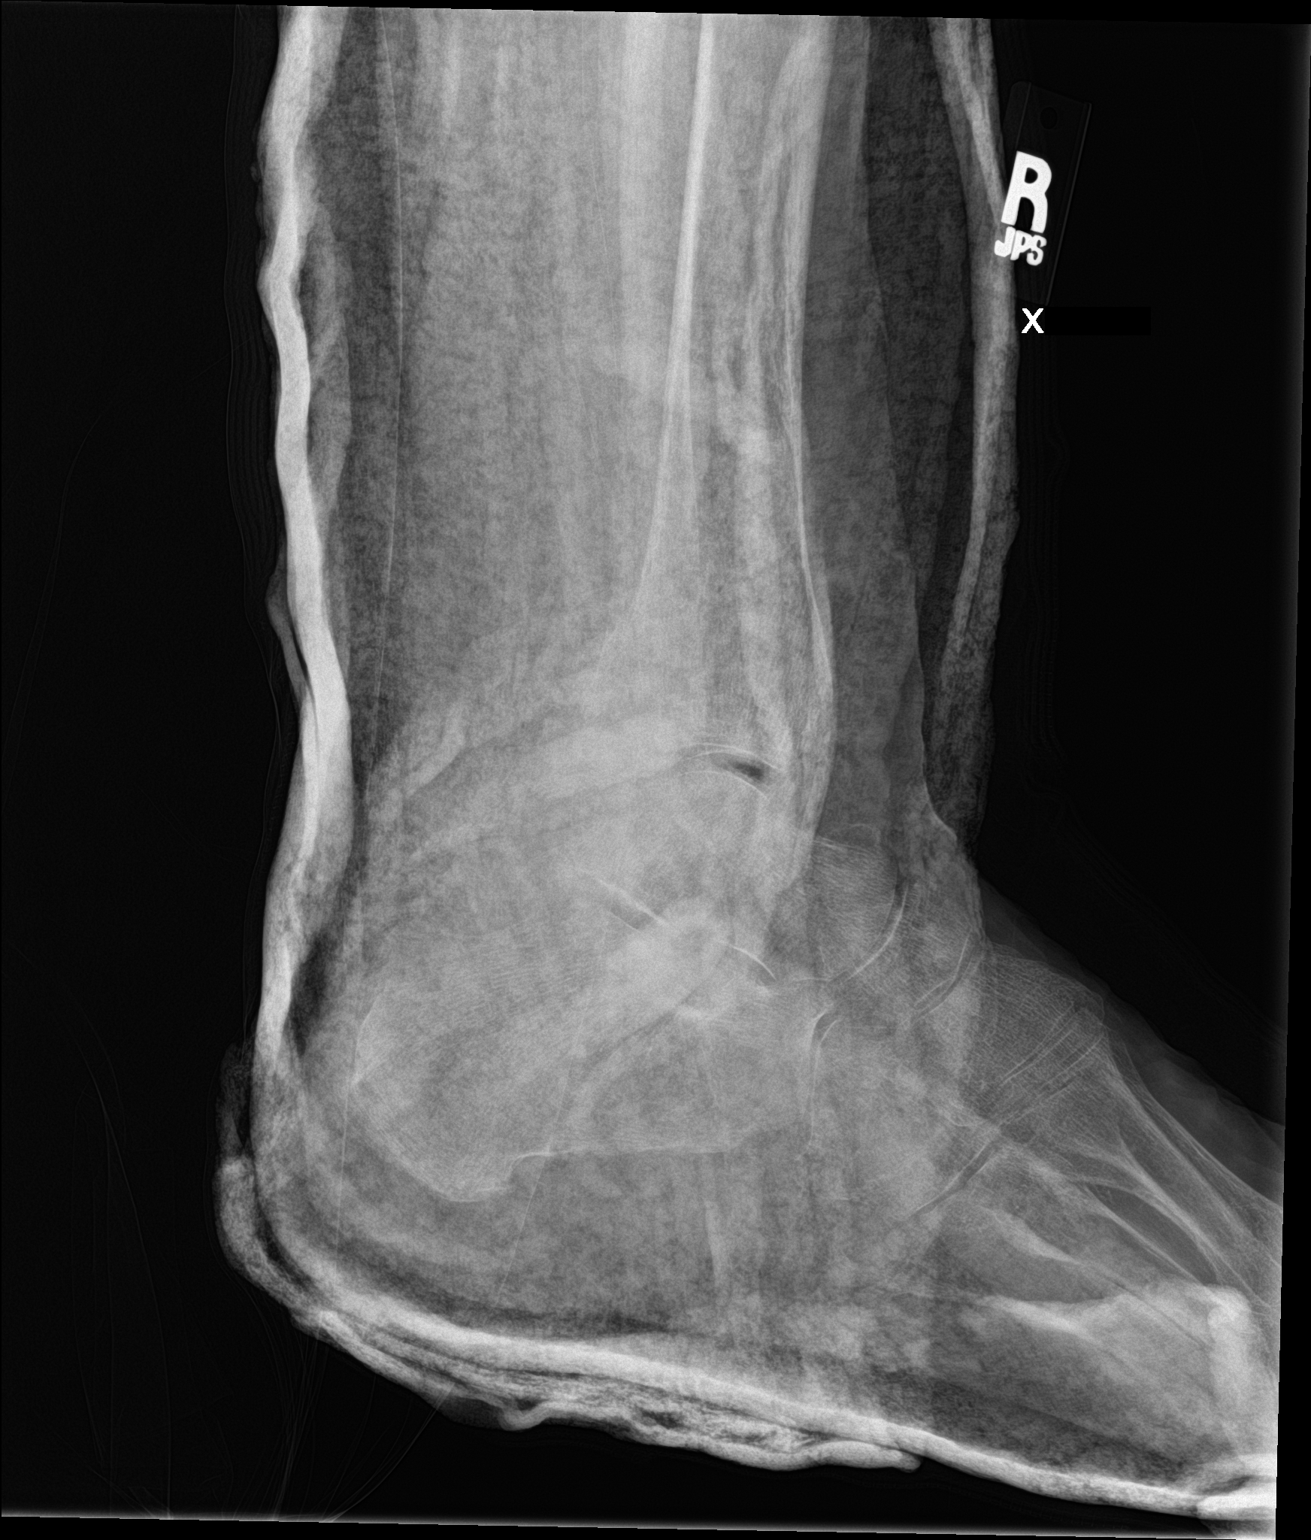
[im 2/2]
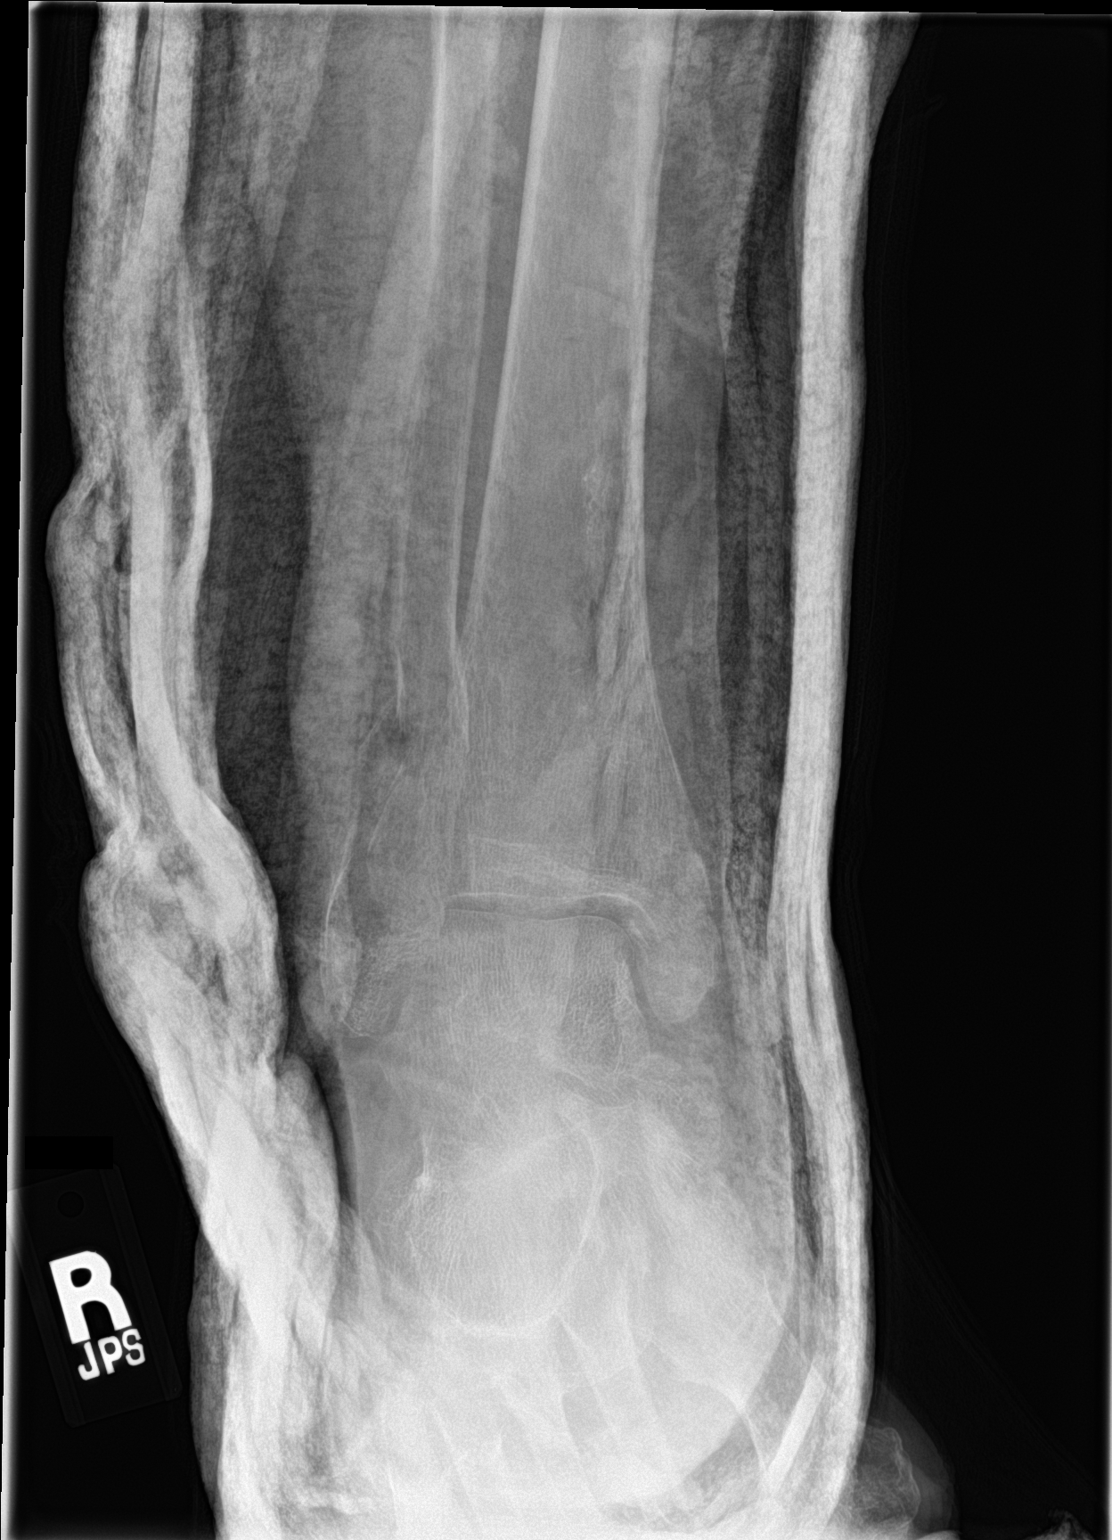

[2 of 2 positions shown; findings below may reference images not displayed]

FINDINGS: Images taking through plaster demonstrate that the dislocation has
been reduced. Alignment and position of the fracture fragments is
markedly improved.
IMPRESSION: Reduction of the dislocation at the right ankle marked improvement
in the alignment and position of fracture fragments.

## 2019-07-15 IMAGING — RF DG TIBIA/FIBULA 2V*R*
1 series · 2 of 2 positions shown · non-contrast
Comparison: 08/20/2017

CLINICAL DATA: Status post I&D and pin fixation of right ankle

EXAM:
DG C-ARM 61-120 MIN; RIGHT TIBIA AND FIBULA - 2 VIEW
FLUOROSCOPY TIME:  49 seconds

[Series 1: run · 2 of 2 slices shown]
[im 1/2]
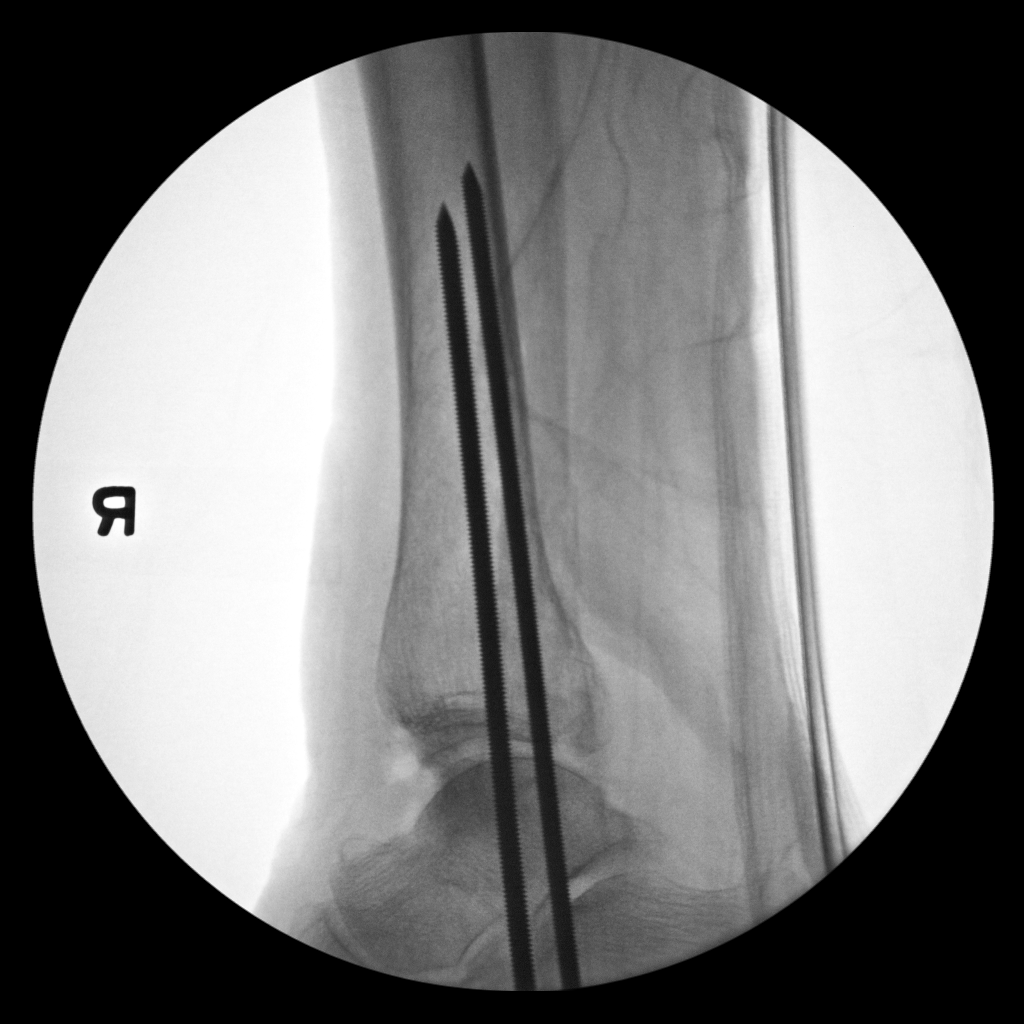
[im 2/2]
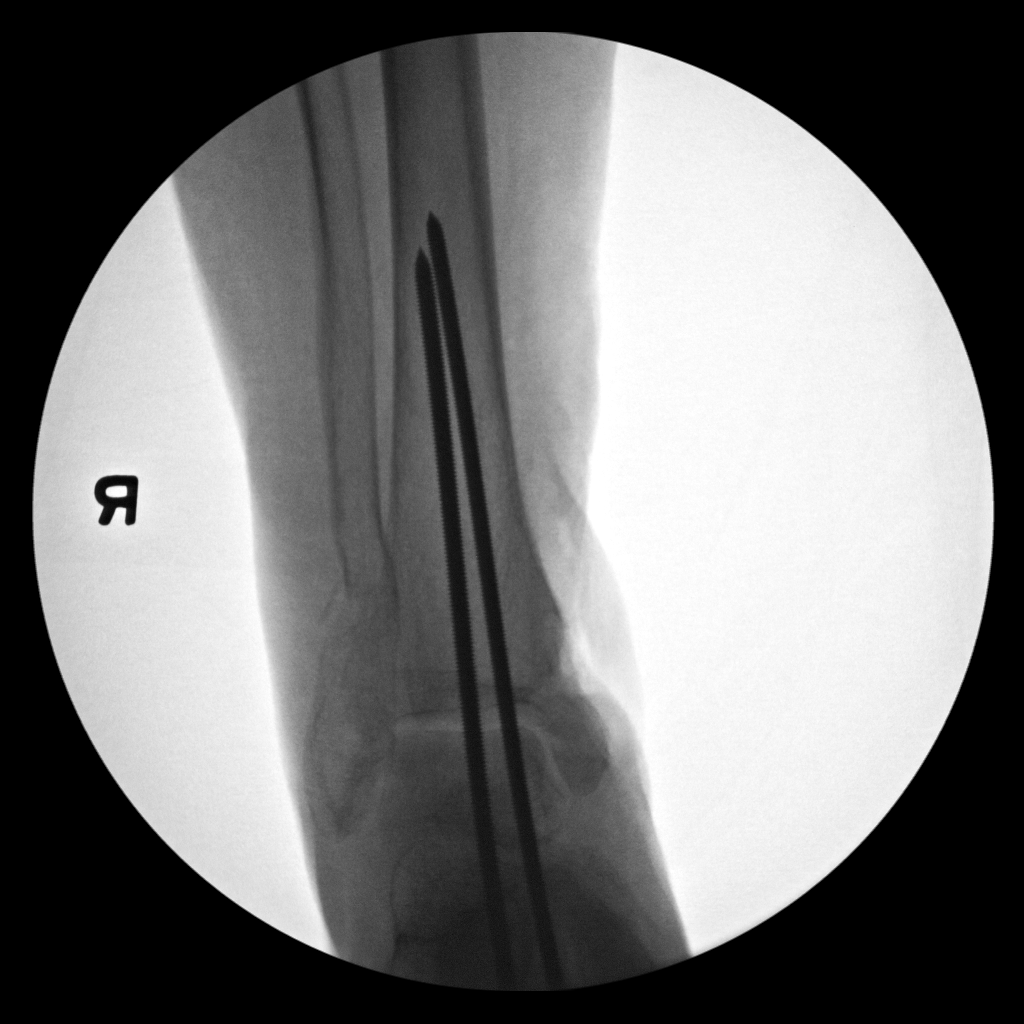

[2 of 2 positions shown; findings below may reference images not displayed]

FINDINGS: Intraoperative fluoroscopic radiographs demonstrating internal
fixation at the tibiotalar joint.

Mildly comminuted distal fibular fracture, in near anatomic
alignment.
IMPRESSION: Intraoperative fluoroscopic radiographs, as above.

## 2019-07-17 IMAGING — DX DG CHEST 1V PORT
1 series · 1 of 1 positions shown · non-contrast
Comparison: Chest x-ray dated June 07, 2017.

CLINICAL DATA: Fever.

EXAM:
PORTABLE CHEST 1 VIEW

[chest ap]
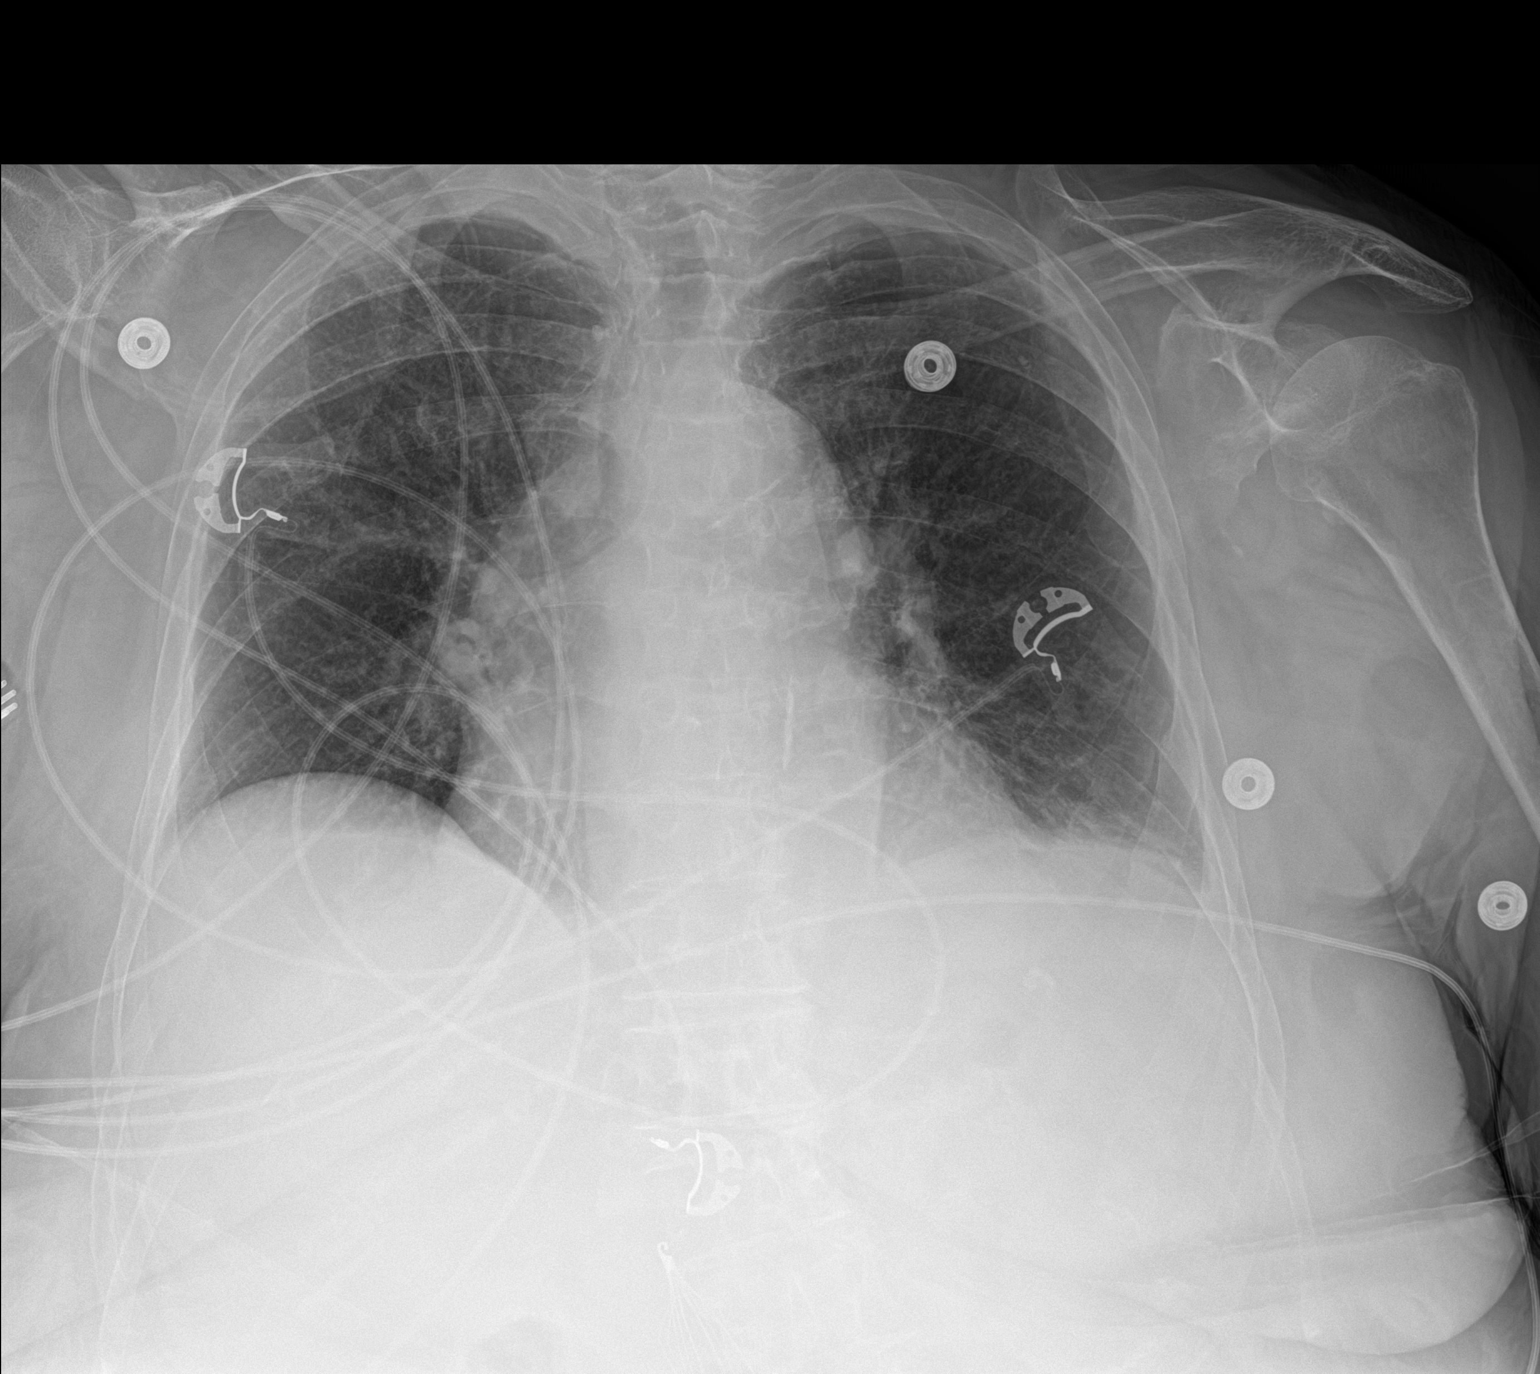

[1 of 1 positions shown; findings below may reference images not displayed]

FINDINGS: The patient is rotated to the right. The cardiomediastinal
silhouette remains at the upper limits of normal in size. Normal
pulmonary vascularity. Low lung volumes with unchanged left basilar
atelectasis. Probable small left pleural effusion. No pneumothorax
or consolidation. No acute osseous abnormality.
IMPRESSION: 1. Probable small left pleural effusion with left basilar
atelectasis.

## 2019-07-17 IMAGING — DX DG CHEST 1V PORT
1 series · 1 of 1 positions shown · non-contrast
Comparison: Chest radiograph performed earlier today at [DATE] a.m.

CLINICAL DATA: Question of aspiration.  May have aspirated a pill.

EXAM:
PORTABLE CHEST 1 VIEW

[chest ap]
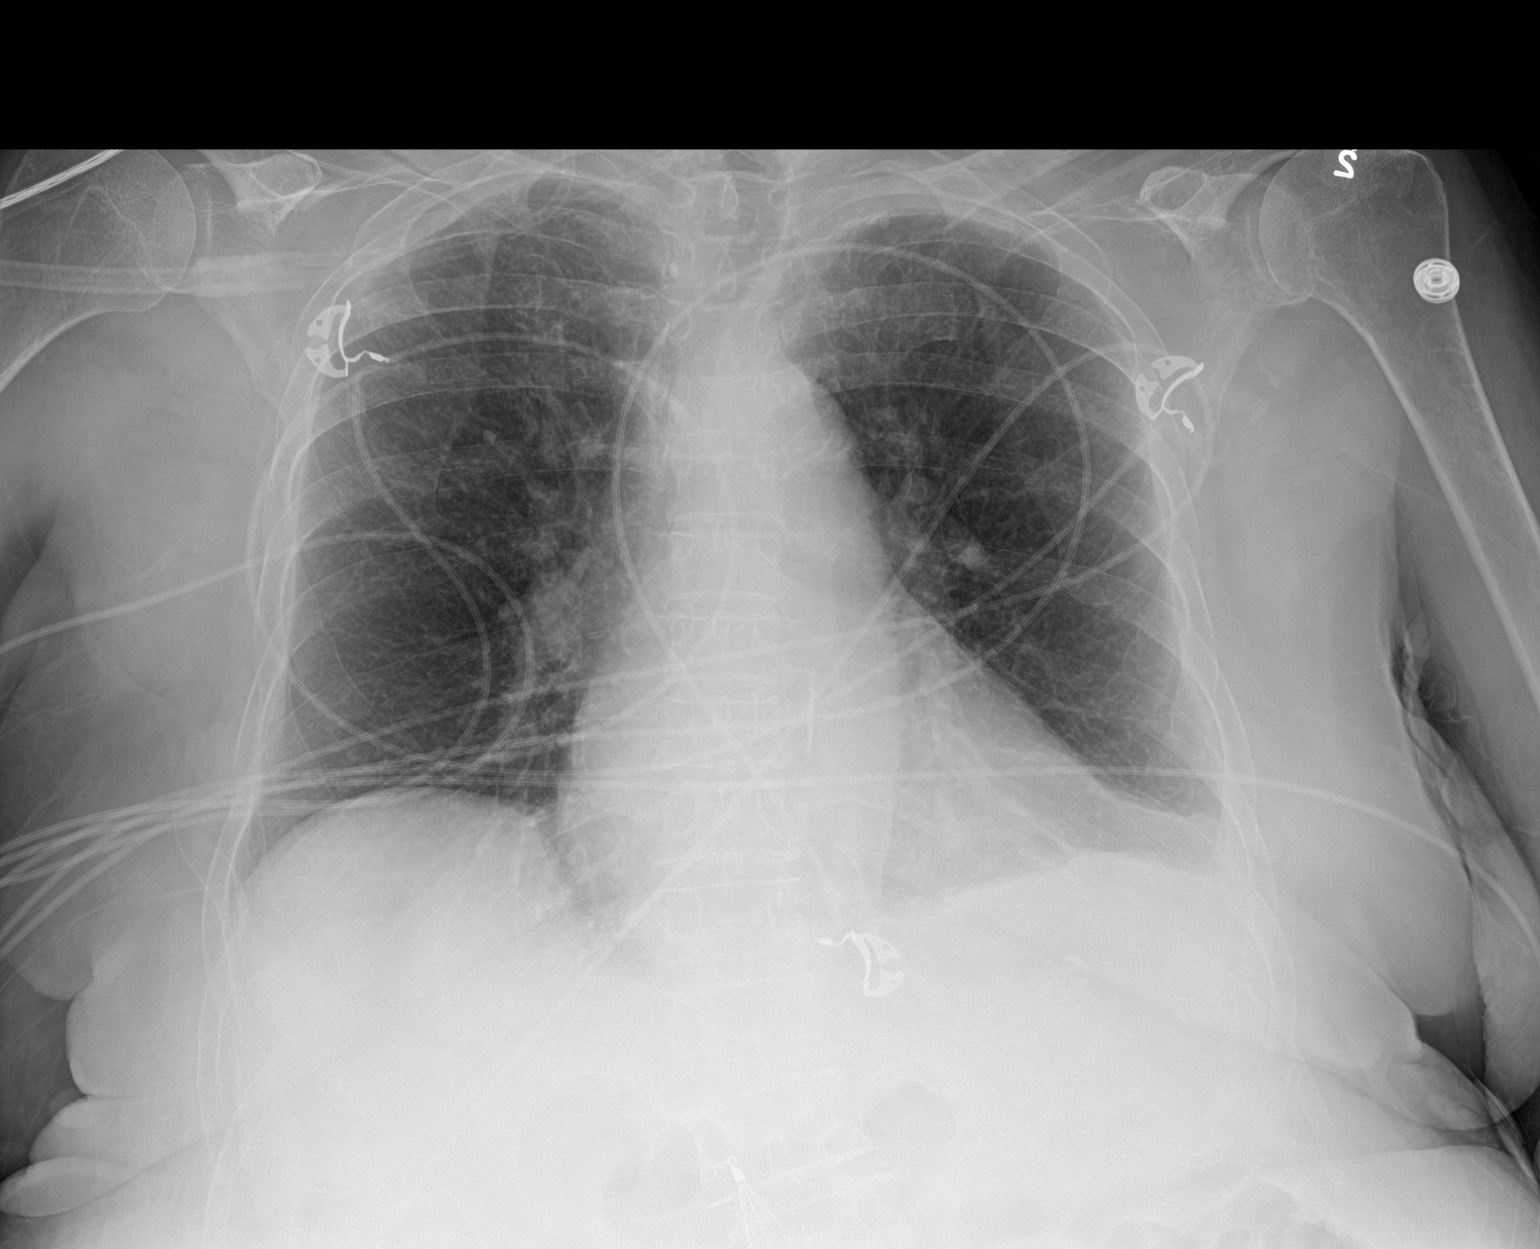

[1 of 1 positions shown; findings below may reference images not displayed]

FINDINGS: There is no evidence of lung hypoexpansion to suggest airway
obstruction secondary to a foreign body. A pill is unlikely to be
visible on radiograph.

The lungs are relatively well expanded. Minimal left basilar
atelectasis is noted. A small left pleural effusion is suspected. No
pneumothorax is seen.

The cardiomediastinal silhouette is borderline normal in size. No
acute osseous abnormalities are identified.
IMPRESSION: 1. No evidence of lung hypoexpansion to suggest airway obstruction
secondary to a foreign body. A pill is unlikely to be visible on
radiograph.
2. Minimal left basilar atelectasis. Suspect small left pleural
effusion.

## 2019-07-20 IMAGING — DX DG CHEST 1V PORT
1 series · 1 of 1 positions shown · non-contrast
Comparison: 08/22/2017

CLINICAL DATA: Shortness breath earlier tonight.

EXAM:
PORTABLE CHEST 1 VIEW

[chest]
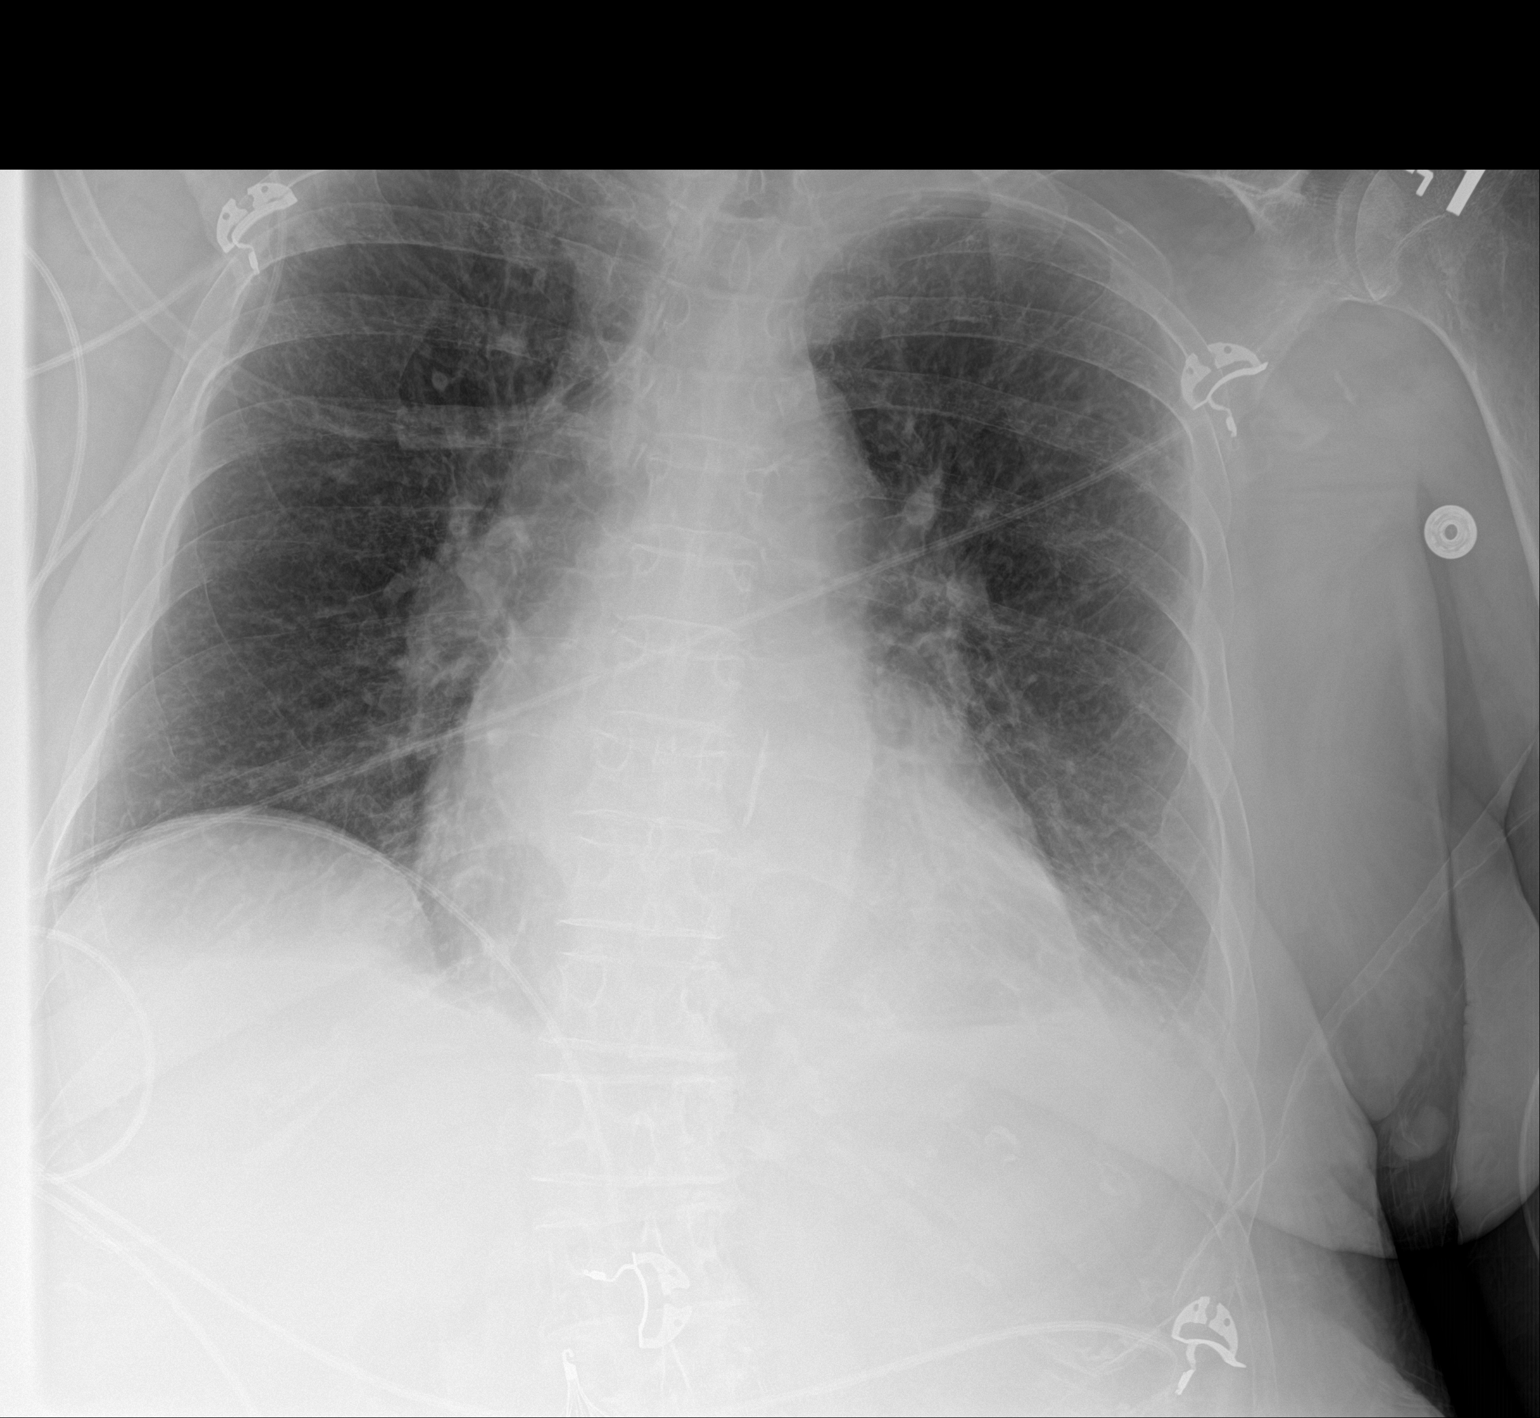

[1 of 1 positions shown; findings below may reference images not displayed]

FINDINGS: Shallow inspiration. Cardiac enlargement. No vascular congestion or
edema. Atelectasis in the left lung base. Old bilateral rib
fractures. No focal consolidation or airspace disease. No blunting
of costophrenic angles. No pneumothorax. Calcification of the aorta.
Vascular calcifications in the left upper quadrant. An inferior vena
caval filter is partially visualized.
IMPRESSION: Cardiac enlargement. No consolidation or edema. Atelectasis in the
left lung base.
